# Patient Record
Sex: Male | Born: 1955
Health system: Southern US, Community
[De-identification: ages and names within clinical notes are randomized; demographics above are authoritative.]

## PROBLEM LIST (undated history)

## (undated) ENCOUNTER — Ambulatory Visit: Admission: EM | Payer: Self-pay | Source: Home / Self Care

## (undated) DIAGNOSIS — N183 Chronic kidney disease, stage 3 unspecified: Secondary | ICD-10-CM

## (undated) DIAGNOSIS — I5022 Chronic systolic (congestive) heart failure: Secondary | ICD-10-CM

## (undated) DIAGNOSIS — N529 Male erectile dysfunction, unspecified: Secondary | ICD-10-CM

## (undated) DIAGNOSIS — F419 Anxiety disorder, unspecified: Secondary | ICD-10-CM

## (undated) DIAGNOSIS — M109 Gout, unspecified: Secondary | ICD-10-CM

## (undated) DIAGNOSIS — D649 Anemia, unspecified: Secondary | ICD-10-CM

## (undated) DIAGNOSIS — J189 Pneumonia, unspecified organism: Secondary | ICD-10-CM

## (undated) DIAGNOSIS — G4733 Obstructive sleep apnea (adult) (pediatric): Secondary | ICD-10-CM

## (undated) DIAGNOSIS — E785 Hyperlipidemia, unspecified: Secondary | ICD-10-CM

## (undated) DIAGNOSIS — I10 Essential (primary) hypertension: Secondary | ICD-10-CM

## (undated) DIAGNOSIS — N2581 Secondary hyperparathyroidism of renal origin: Secondary | ICD-10-CM

## (undated) HISTORY — DX: Male erectile dysfunction, unspecified: N52.9

## (undated) HISTORY — DX: Chronic kidney disease, stage 3 unspecified: N18.30

## (undated) HISTORY — DX: Obstructive sleep apnea (adult) (pediatric): G47.33

## (undated) HISTORY — DX: Chronic kidney disease, stage 3 (moderate): N18.3

## (undated) HISTORY — DX: Hyperlipidemia, unspecified: E78.5

## (undated) HISTORY — PX: COLONOSCOPY: SHX5424

## (undated) HISTORY — DX: Secondary hyperparathyroidism of renal origin: N25.81

## (undated) HISTORY — DX: Gout, unspecified: M10.9

## (undated) HISTORY — DX: Anxiety disorder, unspecified: F41.9

## (undated) HISTORY — PX: VASECTOMY: SHX75

## (undated) HISTORY — DX: Essential (primary) hypertension: I10

## (undated) HISTORY — DX: Anemia, unspecified: D64.9

## (undated) HISTORY — DX: Chronic systolic (congestive) heart failure: I50.22

---

## 2002-02-16 ENCOUNTER — Emergency Department (HOSPITAL_COMMUNITY): Admission: EM | Admit: 2002-02-16 | Discharge: 2002-02-16 | Payer: Self-pay | Admitting: *Deleted

## 2005-05-02 ENCOUNTER — Encounter: Admission: RE | Admit: 2005-05-02 | Discharge: 2005-05-02 | Payer: Self-pay | Admitting: Internal Medicine

## 2008-11-12 ENCOUNTER — Encounter: Admission: RE | Admit: 2008-11-12 | Discharge: 2008-11-12 | Payer: Self-pay | Admitting: Internal Medicine

## 2008-12-25 DIAGNOSIS — I5022 Chronic systolic (congestive) heart failure: Secondary | ICD-10-CM | POA: Insufficient documentation

## 2008-12-26 ENCOUNTER — Ambulatory Visit: Payer: Self-pay | Admitting: Cardiovascular Disease

## 2008-12-26 ENCOUNTER — Inpatient Hospital Stay (HOSPITAL_COMMUNITY): Admission: EM | Admit: 2008-12-26 | Discharge: 2008-12-30 | Payer: Self-pay | Admitting: Emergency Medicine

## 2008-12-26 ENCOUNTER — Encounter: Payer: Self-pay | Admitting: Internal Medicine

## 2008-12-26 DIAGNOSIS — D649 Anemia, unspecified: Secondary | ICD-10-CM | POA: Insufficient documentation

## 2008-12-26 LAB — CONVERTED CEMR LAB
BUN: 25 mg/dL
Bilirubin Urine: NEGATIVE
CO2: 23 meq/L
Calcium: 8.8 mg/dL
Chloride: 107 meq/L
Cholesterol: 141 mg/dL
Creatinine, Ser: 2.24 mg/dL
Eosinophils Relative: 0.1 %
Glucose, Bld: 109 mg/dL
Glucose, Urine, Semiquant: NEGATIVE
HCT: 36.5 %
HDL: 32 mg/dL
Hemoglobin: 12.2 g/dL
Ketones, urine, test strip: NEGATIVE
LDL Cholesterol: 96 mg/dL
Lymphocytes, automated: 1.4 %
MCV: 85 fL
Magnesium: 2.1 mg/dL
Monocytes Relative: 0.3 %
Neutrophils Relative %: 4.4 %
Nitrite: NEGATIVE
Platelets: 202 10*3/uL
Potassium: 3.6 meq/L
Protein, U semiquant: 30
RBC: 4.29 M/uL
RDW: 13.4 %
Sodium: 137 meq/L
Specific Gravity, Urine: 1.015
TSH: 2.631 microintl units/mL
Triglyceride fasting, serum: 65 mg/dL
Urobilinogen, UA: 0.2
WBC: 6.2 10*3/uL
pH: 5

## 2008-12-27 ENCOUNTER — Encounter (INDEPENDENT_AMBULATORY_CARE_PROVIDER_SITE_OTHER): Payer: Self-pay | Admitting: Cardiovascular Disease

## 2008-12-28 ENCOUNTER — Encounter: Payer: Self-pay | Admitting: Internal Medicine

## 2008-12-28 LAB — CONVERTED CEMR LAB
BUN: 32 mg/dL
CO2: 26 meq/L
Chloride: 109 meq/L
Creatinine, Ser: 2.63 mg/dL
Glucose, Bld: 93 mg/dL
Potassium: 3.3 meq/L
Sodium: 141 meq/L

## 2008-12-29 ENCOUNTER — Encounter: Payer: Self-pay | Admitting: Internal Medicine

## 2008-12-29 LAB — CONVERTED CEMR LAB
Albumin: 3 g/dL
BUN: 32 mg/dL
CO2: 25 meq/L
Calcium: 8.8 mg/dL
Chloride: 107 meq/L
Creatinine, Ser: 2.38 mg/dL
Glucose, Bld: 91 mg/dL
Potassium: 3.7 meq/L
Sodium: 140 meq/L

## 2008-12-30 ENCOUNTER — Encounter: Payer: Self-pay | Admitting: Internal Medicine

## 2008-12-30 LAB — CONVERTED CEMR LAB
ALT: 26 units/L
AST: 24 units/L
Albumin: 3 g/dL
Alkaline Phosphatase: 54 units/L
BUN: 31 mg/dL
CO2: 27 meq/L
Calcium: 9 mg/dL
Chloride: 108 meq/L
Creatinine, Ser: 2.66 mg/dL
Glucose, Bld: 88 mg/dL
Potassium: 3.9 meq/L
Sodium: 142 meq/L
Total Bilirubin: 0.6 mg/dL
Total Protein: 5.7 g/dL

## 2009-01-24 ENCOUNTER — Encounter: Payer: Self-pay | Admitting: Internal Medicine

## 2009-01-24 DIAGNOSIS — I10 Essential (primary) hypertension: Secondary | ICD-10-CM | POA: Insufficient documentation

## 2009-01-25 ENCOUNTER — Ambulatory Visit: Payer: Self-pay | Admitting: Internal Medicine

## 2009-01-25 ENCOUNTER — Encounter (INDEPENDENT_AMBULATORY_CARE_PROVIDER_SITE_OTHER): Payer: Self-pay | Admitting: *Deleted

## 2009-01-25 ENCOUNTER — Telehealth: Payer: Self-pay | Admitting: Internal Medicine

## 2009-01-25 DIAGNOSIS — E785 Hyperlipidemia, unspecified: Secondary | ICD-10-CM | POA: Insufficient documentation

## 2009-01-25 DIAGNOSIS — N184 Chronic kidney disease, stage 4 (severe): Secondary | ICD-10-CM | POA: Insufficient documentation

## 2009-01-25 LAB — CONVERTED CEMR LAB
Albumin: 3.8 g/dL (ref 3.5–5.2)
BUN: 47 mg/dL — ABNORMAL HIGH (ref 6–23)
Basophils Absolute: 0 10*3/uL (ref 0.0–0.1)
Basophils Relative: 0.6 % (ref 0.0–3.0)
CO2: 28 meq/L (ref 19–32)
Calcium: 9.2 mg/dL (ref 8.4–10.5)
Chloride: 103 meq/L (ref 96–112)
Creatinine, Ser: 3.2 mg/dL — ABNORMAL HIGH (ref 0.4–1.5)
Eosinophils Absolute: 0.1 10*3/uL (ref 0.0–0.7)
Eosinophils Relative: 3.4 % (ref 0.0–5.0)
Glucose, Bld: 101 mg/dL — ABNORMAL HIGH (ref 70–99)
HCT: 35.7 % — ABNORMAL LOW (ref 39.0–52.0)
Hemoglobin: 12.5 g/dL — ABNORMAL LOW (ref 13.0–17.0)
Lymphocytes Relative: 32.8 % (ref 12.0–46.0)
Lymphs Abs: 1.2 10*3/uL (ref 0.7–4.0)
MCHC: 34.9 g/dL (ref 30.0–36.0)
MCV: 82.7 fL (ref 78.0–100.0)
Monocytes Absolute: 0.3 10*3/uL (ref 0.1–1.0)
Monocytes Relative: 7.3 % (ref 3.0–12.0)
Neutro Abs: 2.1 10*3/uL (ref 1.4–7.7)
Neutrophils Relative %: 55.9 % (ref 43.0–77.0)
Phosphorus: 5.1 mg/dL — ABNORMAL HIGH (ref 2.3–4.6)
Platelets: 148 10*3/uL — ABNORMAL LOW (ref 150.0–400.0)
Potassium: 4.3 meq/L (ref 3.5–5.1)
RBC: 4.32 M/uL (ref 4.22–5.81)
RDW: 12.3 % (ref 11.5–14.6)
Sodium: 139 meq/L (ref 135–145)
WBC: 3.7 10*3/uL — ABNORMAL LOW (ref 4.5–10.5)

## 2009-01-31 ENCOUNTER — Encounter: Payer: Self-pay | Admitting: Cardiovascular Disease

## 2009-02-09 ENCOUNTER — Emergency Department (HOSPITAL_COMMUNITY): Admission: EM | Admit: 2009-02-09 | Discharge: 2009-02-09 | Payer: Self-pay | Admitting: Emergency Medicine

## 2009-02-10 ENCOUNTER — Ambulatory Visit: Payer: Self-pay | Admitting: Internal Medicine

## 2009-02-10 ENCOUNTER — Observation Stay (HOSPITAL_COMMUNITY): Admission: EM | Admit: 2009-02-10 | Discharge: 2009-02-11 | Payer: Self-pay | Admitting: Emergency Medicine

## 2009-02-12 DIAGNOSIS — Z862 Personal history of diseases of the blood and blood-forming organs and certain disorders involving the immune mechanism: Secondary | ICD-10-CM | POA: Insufficient documentation

## 2009-02-12 DIAGNOSIS — Z8639 Personal history of other endocrine, nutritional and metabolic disease: Secondary | ICD-10-CM

## 2009-02-24 ENCOUNTER — Ambulatory Visit: Payer: Self-pay | Admitting: Cardiovascular Disease

## 2009-03-10 ENCOUNTER — Encounter: Payer: Self-pay | Admitting: Cardiovascular Disease

## 2009-03-17 ENCOUNTER — Encounter: Payer: Self-pay | Admitting: Cardiovascular Disease

## 2009-03-17 ENCOUNTER — Ambulatory Visit: Payer: Self-pay

## 2009-04-07 ENCOUNTER — Telehealth: Payer: Self-pay | Admitting: Cardiovascular Disease

## 2009-04-12 ENCOUNTER — Ambulatory Visit: Payer: Self-pay | Admitting: Cardiovascular Disease

## 2009-04-26 ENCOUNTER — Ambulatory Visit: Payer: Self-pay | Admitting: Internal Medicine

## 2009-04-26 DIAGNOSIS — N529 Male erectile dysfunction, unspecified: Secondary | ICD-10-CM | POA: Insufficient documentation

## 2009-04-28 ENCOUNTER — Encounter: Payer: Self-pay | Admitting: Internal Medicine

## 2009-05-24 ENCOUNTER — Telehealth: Payer: Self-pay | Admitting: Internal Medicine

## 2009-05-31 ENCOUNTER — Ambulatory Visit: Payer: Self-pay | Admitting: Cardiovascular Disease

## 2009-06-22 ENCOUNTER — Telehealth: Payer: Self-pay | Admitting: Internal Medicine

## 2009-07-26 ENCOUNTER — Ambulatory Visit: Payer: Self-pay | Admitting: Internal Medicine

## 2009-07-26 LAB — CONVERTED CEMR LAB
Albumin: 3.8 g/dL (ref 3.5–5.2)
BUN: 31 mg/dL — ABNORMAL HIGH (ref 6–23)
CO2: 30 meq/L (ref 19–32)
Calcium: 9 mg/dL (ref 8.4–10.5)
Chloride: 106 meq/L (ref 96–112)
Creatinine, Ser: 3 mg/dL — ABNORMAL HIGH (ref 0.4–1.5)
GFR calc non Af Amer: 28.42 mL/min (ref >60)
Glucose, Bld: 99 mg/dL (ref 70–99)
Phosphorus: 3.8 mg/dL (ref 2.3–4.6)
Potassium: 4.2 meq/L (ref 3.5–5.1)
Sodium: 141 meq/L (ref 135–145)

## 2009-09-13 ENCOUNTER — Encounter: Payer: Self-pay | Admitting: Internal Medicine

## 2009-09-20 ENCOUNTER — Ambulatory Visit: Payer: Self-pay | Admitting: Cardiovascular Disease

## 2009-09-22 ENCOUNTER — Encounter (INDEPENDENT_AMBULATORY_CARE_PROVIDER_SITE_OTHER): Payer: Self-pay | Admitting: *Deleted

## 2009-09-29 ENCOUNTER — Encounter (INDEPENDENT_AMBULATORY_CARE_PROVIDER_SITE_OTHER): Payer: Self-pay

## 2009-10-04 ENCOUNTER — Ambulatory Visit: Payer: Self-pay | Admitting: Gastroenterology

## 2009-10-12 ENCOUNTER — Ambulatory Visit: Payer: Self-pay | Admitting: Gastroenterology

## 2009-10-12 LAB — HM COLONOSCOPY: HM Colonoscopy: NORMAL

## 2009-10-27 ENCOUNTER — Ambulatory Visit: Payer: Self-pay | Admitting: Internal Medicine

## 2009-10-31 ENCOUNTER — Telehealth: Payer: Self-pay | Admitting: Internal Medicine

## 2009-11-30 ENCOUNTER — Telehealth: Payer: Self-pay | Admitting: Internal Medicine

## 2009-12-15 ENCOUNTER — Encounter: Payer: Self-pay | Admitting: Internal Medicine

## 2009-12-26 ENCOUNTER — Encounter: Payer: Self-pay | Admitting: Internal Medicine

## 2010-01-10 ENCOUNTER — Telehealth: Payer: Self-pay | Admitting: Internal Medicine

## 2010-01-31 ENCOUNTER — Ambulatory Visit: Payer: Self-pay | Admitting: Internal Medicine

## 2010-01-31 LAB — CONVERTED CEMR LAB
Cholesterol: 185 mg/dL (ref 0–200)
HDL: 32.1 mg/dL — ABNORMAL LOW (ref >39.00)
LDL Cholesterol: 122 mg/dL — ABNORMAL HIGH (ref 0–99)
Total CHOL/HDL Ratio: 6
Triglycerides: 155 mg/dL — ABNORMAL HIGH (ref 0.0–149.0)
VLDL: 31 mg/dL (ref 0.0–40.0)

## 2010-03-13 ENCOUNTER — Encounter: Payer: Self-pay | Admitting: Internal Medicine

## 2010-03-21 ENCOUNTER — Encounter: Payer: Self-pay | Admitting: Internal Medicine

## 2010-04-17 ENCOUNTER — Telehealth: Payer: Self-pay | Admitting: Internal Medicine

## 2010-05-02 ENCOUNTER — Ambulatory Visit: Payer: Self-pay | Admitting: Internal Medicine

## 2010-05-05 ENCOUNTER — Ambulatory Visit: Payer: Self-pay | Admitting: Cardiovascular Disease

## 2010-06-08 ENCOUNTER — Encounter: Payer: Self-pay | Admitting: Internal Medicine

## 2010-06-08 LAB — CONVERTED CEMR LAB
ALT: 20 units/L
AST: 28 units/L
Albumin: 4 g/dL
BUN: 24 mg/dL
Basophils Relative: 0 %
CO2: 26 meq/L
Calcium: 8.5 mg/dL
Chloride: 104 meq/L
Creatinine, Ser: 2.34 mg/dL
Eosinophils Relative: 2 %
Glucose, Bld: 101 mg/dL
HCT: 35.4 %
Hemoglobin: 12.1 g/dL
Lymphocytes, automated: 13 %
MCV: 85 fL
Monocytes Relative: 8 %
Neutrophils Relative %: 77 %
Platelets: 203 10*3/uL
Potassium: 4.3 meq/L
RBC: 4.18 M/uL
RDW: 12.9 %
Sodium: 138 meq/L
Total Bilirubin: 0.2 mg/dL
Total Protein: 6.4 g/dL
WBC: 9.1 10*3/uL

## 2010-06-23 ENCOUNTER — Encounter: Payer: Self-pay | Admitting: Internal Medicine

## 2010-07-16 LAB — CONVERTED CEMR LAB
ALT: 16 units/L
ALT: 20 units/L
AST: 25 units/L
AST: 36 units/L
Albumin: 4.1 g/dL
Albumin: 4.4 g/dL
Alkaline Phosphatase: 79 units/L
Alkaline Phosphatase: 80 units/L
BUN: 27 mg/dL
BUN: 33 mg/dL
CO2: 28 meq/L
CO2: 29 meq/L
Calcium: 9.1 mg/dL
Calcium: 9.3 mg/dL
Chloride: 100 meq/L
Chloride: 102 meq/L
Creatinine, Ser: 2.78 mg/dL
Creatinine, Ser: 2.92 mg/dL
Glucose, Bld: 93 mg/dL
Glucose, Bld: 95 mg/dL
HCT: 35.1 %
Hemoglobin: 12.1 g/dL
Platelets: 206 10*3/uL
Potassium: 4 meq/L
Potassium: 4.5 meq/L
RBC: 4.12 M/uL
Sodium: 138 meq/L
Sodium: 139 meq/L
Total Bilirubin: 0.3 mg/dL
Total Bilirubin: 0.3 mg/dL
Total Protein: 6.8 g/dL
Total Protein: 6.9 g/dL
WBC: 5.9 10*3/uL

## 2010-07-20 NOTE — Procedures (Signed)
Summary: Colonoscopy  Patient: [name] Note: All result statuses are Final unless otherwise noted.  Tests: (1) Colonoscopy (COL)   COL Colonoscopy           [facility] [employer].     [place], [place]  [address]           COLONOSCOPY PROCEDURE REPORT           PATIENT:  [name], [name]  MR#:  [id number]     BIRTHDATE:  1955-10-14, 37 yrs. old  GENDER:  male     ENDOSCOPIST:  Milus Banister, MD     REF. BY:  Gwendolyn Grant, M.D.     PROCEDURE DATE:  10/12/2009     PROCEDURE:  Diagnostic Colonoscopy     ASA CLASS:  Class III     INDICATIONS:  Routine Risk Screening     MEDICATIONS:   Fentanyl 50 mcg IV, Versed 4 mg IV           DESCRIPTION OF PROCEDURE:   After the risks benefits and     alternatives of the procedure were thoroughly explained, informed     consent was obtained.  Digital rectal exam was performed and     revealed no rectal masses.   The LB CF-[id number] endoscope     was introduced through the anus and advanced to the cecum, which     was identified by both the appendix and ileocecal valve, without     limitations.  The quality of the prep was good, using MoviPrep.     The instrument was then slowly withdrawn as the colon was fully     examined.     <<PROCEDUREIMAGES>>           FINDINGS:  Mild diverticulosis was found in the sigmoid to     descending colon segments (see image1).  This was otherwise a     normal examination of the colon (see image3, image4, and image5).     Retroflexed views in the rectum revealed no abnormalities.    The     scope was then withdrawn from the patient and the procedure     completed.           COMPLICATIONS:  None           ENDOSCOPIC IMPRESSION:     1) Mild diverticulosis in the sigmoid to descending colon     segments     2) Otherwise normal examination     3) No polyps or cancers           RECOMMENDATIONS:     1) Continue current colorectal screening recommendations for  "routine risk" patients with a repeat colonoscopy in 10 years.           REPEAT EXAM:  10 years           ______________________________     Milus Banister, MD           n.     eSIGNED:   Milus Banister at 10/12/2009 03:53 PM           [place], Venora Maples, [id number]  Note: An exclamation mark (!) indicates a result that was not dispersed into the flowsheet. Document Creation Date: 10/12/2009 3:54 PM _______________________________________________________________________  (1) Order result status: Final Collection or observation date-time: 10/12/2009 15:50 Requested date-time:  Receipt date-time:  Reported date-time:  Referring Physician:   Ordering Physician:  Owens Loffler [phone]) Specimen Source:  Source: Tawanna Cooler Order Number: [id number] Lab site:   Appended Document: Colonoscopy    Clinical Lists Changes  Observations: Added new observation of COLONNXTDUE: 09/2019 (10/12/2009 16:08)

## 2010-07-20 NOTE — Letter (Signed)
Summary: [facility] Instructions  [facility]  [place], [place] [address]   Phone: [phone]  Fax: [phone]       YVENS STOBER    [birth year]    MRN: [id number]        Procedure Day Sudie Grumbling:  [facility]  10/12/09     Arrival Time:  3:00PM     Procedure Time:  4:00PM     Location of Procedure:                    _ X_  [place] (4th Floor)                        [place]   Starting 5 days prior to your procedure 10/07/09 do not eat nuts, seeds, popcorn, corn, beans, peas,  salads, or any raw vegetables.  Do not take any fiber supplements (e.g. Metamucil, Citrucel, and Benefiber).  THE DAY BEFORE YOUR PROCEDURE         DATE: 10/11/09  DAY: TUESDAY  1.  Drink clear liquids the entire day-NO SOLID FOOD  2.  Do not drink anything colored red or purple.  Avoid juices with pulp.  No orange juice.  3.  Drink at least 64 oz. (8 glasses) of fluid/clear liquids during the day to prevent dehydration and help the prep work efficiently.  CLEAR LIQUIDS INCLUDE: Water Jello Ice Popsicles Tea (sugar ok, no milk/cream) Powdered fruit flavored drinks Coffee (sugar ok, no milk/cream) Gatorade Juice: apple, white grape, white cranberry  Lemonade Clear bullion, consomm, broth Carbonated beverages (any kind) Strained chicken noodle soup Hard Candy                             4.  In the morning, mix first dose of MoviPrep solution:    Empty 1 Pouch A and 1 Pouch B into the disposable container    Add lukewarm drinking water to the top line of the container. Mix to dissolve    Refrigerate (mixed solution should be used within 24 hrs)  5.  Begin drinking the prep at 5:00 p.m. The MoviPrep container is divided by 4 marks.   Every 15 minutes drink the solution down to the next mark (approximately 8 oz) until the full liter is complete.   6.  Follow completed prep with 16 oz of clear liquid of your choice  (Nothing red or purple).  Continue to drink clear liquids until bedtime.  7.  Before going to bed, mix second dose of MoviPrep solution:    Empty 1 Pouch A and 1 Pouch B into the disposable container    Add lukewarm drinking water to the top line of the container. Mix to dissolve    Refrigerate  THE DAY OF YOUR PROCEDURE      DATE: 10/12/09  DAY: WEDNESDAY  Beginning at 11:00AM (5 hours before procedure):         1. Every 15 minutes, drink the solution down to the next mark (approx 8 oz) until the full liter is complete.  2. Follow completed prep with 16 oz. of clear liquid of your choice.    3. You may drink clear liquids until 2:00PM (2 HOURS BEFORE PROCEDURE).   MEDICATION INSTRUCTIONS  Unless otherwise instructed, you should take regular prescription medications with a small sip of water   as early as possible the morning  of your procedure..  Additional medication instructions: Do not take furosemide am of procedure         OTHER INSTRUCTIONS  You will need a responsible adult at least 53 years of age to accompany you and drive you home.   This person must remain in the waiting room during your procedure.  Wear loose fitting clothing that is easily removed.  Leave jewelry and other valuables at home.  However, you may wish to bring a book to read or  an iPod/MP3 player to listen to music as you wait for your procedure to start.  Remove all body piercing jewelry and leave at home.  Total time from sign-in until discharge is approximately 2-3 hours.  You should go home directly after your procedure and rest.  You can resume normal activities the  day after your procedure.  The day of your procedure you should not:   Drive   Make legal decisions   Operate machinery   Drink alcohol   Return to work  You will receive specific instructions about eating, activities and medications before you leave.    The above instructions have been reviewed and explained  to me by   Cornelia Copa RN  October 04, 2009 9:46 AM     I fully understand and can verbalize these instructions _____________________________ Date _________

## 2010-07-20 NOTE — Letter (Signed)
Summary: Previsit letter  [facility]  [place], [place] [address]   Phone: [phone]  Fax: [phone]       09/22/2009 MRN: [id number]  [name], [place]  [address]  Dear [name],  Welcome to the Gastroenterology Division at [facility].    You are scheduled to see a nurse for your pre-procedure visit on 10-04-09 at 9:00a.m. on the 3rd floor at [facility], [place] [employer].  We ask that you try to arrive at our office 15 minutes prior to your appointment time to allow for check-in.  Your nurse visit will consist of discussing your medical and surgical history, your immediate family medical history, and your medications.    Please bring a complete list of all your medications or, if you prefer, bring the medication bottles and we will list them.  We will need to be aware of both prescribed and over the counter drugs.  We will need to know exact dosage information as well.  If you are on blood thinners (Coumadin, Plavix, Aggrenox, Ticlid, etc.) please call our office today/prior to your appointment, as we need to consult with your physician about holding your medication.   Please be prepared to read and sign documents such as consent forms, a financial agreement, and acknowledgement forms.  If necessary, and with your consent, a friend or relative is welcome to sit-in on the nurse visit with you.  Please bring your insurance card so that we may make a copy of it.  If your insurance requires a referral to see a specialist, please bring your referral form from your primary care physician.  No co-pay is required for this nurse visit.     If you cannot keep your appointment, please call [phone] to cancel or reschedule prior to your appointment date.  This allows [place] the opportunity to schedule an appointment for another patient in need of care.    Thank you for choosing [facility] for your medical needs.  We  appreciate the opportunity to care for you.  Please visit [place] at our website  to learn more about our practice.                     Sincerely.                                                                                                                   The Gastroenterology Division

## 2010-07-20 NOTE — Letter (Signed)
Summary: [facility]   Imported By: Bubba Hales 03/23/2010 09:40:32  _____________________________________________________________________  External Attachment:    Type:   Image     Comment:   External Document

## 2010-07-20 NOTE — Assessment & Plan Note (Signed)
Summary: f86m/jml   Primary Provider:  Rowe Clack MD   History of Present Illness: [name] is seen today for F/U of DCM, CRF and HTN. He saw Dr. Dedierding and had his lasix increased for LE edema.  His Cr is 2.7.  In the future he would be a candidate for kidney transplant.  He has been hospitalized multiiple times for CHF, EF 25% and hard to contorl BP.  On one occasion it was from  midrin and another salt indiscretion.  He is much more careful about his medication and diet;.  He denies PND, orthopnea, recurrent edem, or palpitaoitns. He is still working out and I encouraged him to do less heavy lifting and more aerobic activity.  He just returned from visiting his sister in [place].  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 Take 1 By Mouth Once Daily 2)  Clonidine Hcl 0.3 Mg Tabs (Clonidine Hcl) .... Take 1 By Mouth Three Times A Day or As Directed 3)  Furosemide 80 Mg Tabs (Furosemide) .... Take One Tab Two Times A Day 4)  Hydralazine Hcl 50 Mg Tabs (Hydralazine Hcl) .... One Tablet By Mouth Three Times Daily 5)  Labetalol Hcl 200 Mg Tabs (Labetalol Hcl) .... Take Two Tablets By Mouth Three Times A Day (Or As Directed).as Needed 6)  Micardis 80 Mg Tabs (Telmisartan) .... Take 1 By Mouth Once Daily 7)  Aspirin 81 Mg Tbec (Aspirin) .... Take 1 By Mouth Once Daily 8)  Imdur 30 Mg Xr24h-Tab (Isosorbide Mononitrate) .Marland Kitchen.. 1 Tab By Mouth Two Times A Day 9)  Lovaza 1 Gm Caps (Omega-3-Acid Ethyl Esters) .Marland Kitchen.. 1 By Mouth Two Times A Day 10)  Viagra 100 Mg Tabs (Sildenafil Citrate) ....  1/2-1 Tab 1 Hour Prior To Intercourse As Instructed 11)  Klor-Con M20 20 Meq Cr-Tabs (Potassium Chloride Crys Cr) .... Take 1 By Mouth Once Daily 12)  Calcium 500 Mg Tabs (Calcium) .Marland Kitchen.. 1 By Mouth Once Daily  Allergies (verified): No Known Drug Allergies  Past History:  Past Medical History: Last updated: [id number] CHRONIC SYSTOLIC HEART FAILURE (123456) DYSLIPIDEMIA HYPERTENSION (ICD-401.9), malignant CHRONIC KIDNEY DISEASE STAGE III (MODERATE)  baseline Cr 2.6-3.0 07/2009 HYPERPARATHYROIDISM, HX OF NICM - LVEF 30% - echo 7/10  MD roster:    renal - deterding cards - nishan  Past Surgical History: Last updated: 02/12/2009  Vasectomy.   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   Vital Signs:  Patient profile:   55 year old male Height:      73 inches Weight:      216 pounds BMI:     28.60 Pulse rate:   68 / minute Resp:  14 per minute BP sitting:   142 / 85  (right arm)  Vitals Entered By: Burnett Kanaris (May 05, 2010 2:48 PM)  Physical Exam  General:  Affect appropriate Healthy:  appears stated age 55: normal Neck supple with no adenopathy JVP normal no bruits no thyromegaly Lungs clear with no wheezing and good diaphragmatic motion Heart:  S1/S2 no murmur,rub, gallop or click PMI normal Abdomen: benighn, BS positve, no tenderness, no AAA no bruit.  No HSM or HJR Distal pulses intact with no bruits No edema Neuro non-focal Skin warm and dry    Impression  & Recommendations:  Problem # 1:  HYPERTENSION (ICD-401.9) Well controlled contineu low sodium diet, exercise and meds His updated medication list for this problem includes:    Tekturna 300 Mg Tabs (Aliskiren fumarate) .Marland Kitchen... Take 1 by mouth once daily    Clonidine Hcl 0.3 Mg Tabs (Clonidine hcl) .Marland Kitchen... Take 1 by mouth three times a day or as directed    Furosemide 80 Mg Tabs (Furosemide) .Marland Kitchen... Take one tab two times a day    Hydralazine Hcl 50 Mg Tabs (Hydralazine hcl) ..... One tablet by mouth three times daily    Labetalol Hcl 200 Mg Tabs (Labetalol hcl) .Marland Kitchen... Take two tablets by mouth three times a day (or as directed).as needed    Micardis 80 Mg Tabs (Telmisartan) .Marland Kitchen... Take 1 by mouth once daily    Aspirin 81 Mg Tbec (Aspirin) .Marland Kitchen... Take 1 by mouth once daily  Orders: EKG w/ Interpretation ([address])  Problem # 2:  CHRONIC KIDNEY DISEASE STAGE III (MODERATE) (ICD-585.3) CR stable at 2.7  F/U Dederding  Avoid NSAI's and nodium    Problem # 3:  CHRONIC SYSTOLIC HEART FAILURE (123456) Functional class one.  f/U echo in 6 months His updated medication list for this problem includes:    Furosemide 80 Mg Tabs (Furosemide) .Marland Kitchen... Take one tab two times a day    Labetalol Hcl 200 Mg Tabs (Labetalol hcl) .Marland Kitchen... Take two tablets by mouth three times a day (or as directed).as needed    Micardis 80 Mg Tabs (Telmisartan) .Marland Kitchen... Take 1 by mouth once daily    Aspirin 81 Mg Tbec (Aspirin) .Marland Kitchen... Take 1 by mouth once daily    Imdur 30 Mg Xr24h-tab (Isosorbide mononitrate) .Marland Kitchen... 1 tab by mouth two times a day  Orders: Echocardiogram (Echo)  Patient Instructions: 1)  Your physician recommends that you schedule a follow-up appointment in: [place] 2)  Your physician recommends that you continue on your current medications as directed. Please refer to the Current Medication list given to you today. 3)  Your physician has requested that you have an echocardiogram.  Echocardiography is a  painless test that uses sound waves to create images of your heart. It provides your doctor with information about the size and shape of your heart and how well your heart's chambers and valves are working.  This procedure takes approximately one hour. There are no restrictions for this procedure.6 MONTHS SEE DR Johnsie Cancel SAME DAY   EKG Report  Procedure date:  05/05/2010  Findings:      NSR 72 Inferiior T wave inversion No change from prev

## 2010-07-20 NOTE — Progress Notes (Signed)
Summary: Viagra  Phone Note Call from Patient   Caller: Patient Reason for Call: Refill Medication Summary of Call: Pt states he has lost rx for Viagra. Req md to call rx into pharm. Is this ok Initial call taken by: Tomma Lightning,  Oct 31, 2009 3:31 PM  Follow-up for Phone Call        ok -  Follow-up by: Rowe Clack MD,  Oct 31, 2009 3:47 PM  Additional Follow-up for Phone Call Additional follow up Details #1::        [facility]. notified pt rx sent electronically Additional Follow-up by: Tomma Lightning,  Oct 31, 2009 4:31 PM

## 2010-07-20 NOTE — Progress Notes (Signed)
Summary: Viagra  Phone Note Outgoing Call   Call placed by: Tomma Lightning,  November 30, 2009 10:21 AM Call placed to: pharmacy Summary of Call: Pt contacted [place] stating that pharmacy states not getting refill on his viagra. Rx was approve x's 1 on 11/19/09. Notified pharm spoke with Sara/ tech she states that they never recieved electronic rx back. That they was having problems with there electronic system. Giving verbal ok x'1 on his viagra. Will notify pt to let him know that we did call rx into pharm.  Initial call taken by: Tomma Lightning,  November 30, 2009 10:25 AM  Follow-up for Phone Call        Pt informed and is requesting an additional refill until OV in August at which time he will discuss with MD having a 3 mth Rx at a time. Follow-up by: Crissie Sickles, [place],  November 30, 2009 10:35 AM  Additional Follow-up for Phone Call Additional follow up Details #1::        ok for refill to get to aug ov Additional Follow-up by: Rowe Clack MD,  November 30, 2009 11:49 AM    Additional Follow-up for Phone Call Additional follow up Details #2::    Pharm left vm: Viagra interacts w/isosorbide. Is this still ok to fill? ................Marland KitchenCharlsie Quest, CMA  November 30, 2009 3:25 PM   yes - we have checked and approved this with cards previously Rowe Clack MD  November 30, 2009 4:59 PM   Additional Follow-up for Phone Call Additional follow up Details #3:: Details for Additional Follow-up Action Taken: Cristie Hem at [place] advised that interaction has been addressed and okay'd with pt's Cardiologist. I also authorized 1 additional refill on pt's Viagra. Additional Follow-up by: Crissie Sickles, [place],  December 01, 2009 9:17 AM

## 2010-07-20 NOTE — Assessment & Plan Note (Signed)
Summary: 3 MTH FU---STC   Vital Signs:  Patient profile:   55 year old male Height:      73 inches (185.42 cm) Weight:      213.0 pounds (96.82 kg) O2 Sat:      96 % on Room air Temp:     98.3 degrees F (36.83 degrees C) oral Pulse rate:   66 / minute BP sitting:   138 / 98  (left arm) Cuff size:   large  Vitals Entered By: Tomma Lightning RMA (May 02, 2010 2:35 PM)  O2 Flow:  Room air CC: 3 month follow-up Is Patient Diabetic? No Pain Assessment Patient in pain? no        Primary Care Provider:  Rowe Clack MD  CC:  3 month follow-up.  History of Present Illness: here for 3 mo followup  1) HTN - reports compliance with ongoing medical treatment and no changes in medication dose or frequency. denies adverse side effects related to current therapy. running high BP last few days but no CP, HA or SOB. follows with dr. Johnsie Cancel for same - now will see him every 6-12 mos.  2) dyslipidemia reports compliance with ongoing medical treatment and no changes in medication dose or frequency. denies adverse side effects related to current therapy.   3) CKD -renal insuff - follows with renal deterding for same - upcoming OV this week (rescheduled) - Cr stablizing 2.8 range - also now on calcium - no edema, no skin changes  4) ED - ok'd for viagra by cards - requesting higher dose as only gets 4 at time  Clinical Review Panels:  Lipid Management   Cholesterol:  185 (01/31/2010)   LDL (bad choesterol):  122 (01/31/2010)   HDL (good cholesterol):  32.10 (01/31/2010)   Triglycerides:  65 (12/26/2008)  CBC   WBC:  5.9 (12/15/2009)   RBC:  4.12 (12/15/2009)   Hgb:  12.1 (12/15/2009)   Hct:  35.1 (12/15/2009)   Platelets:  206 (12/15/2009)   MCV  82.7 (01/25/2009)   MCHC  34.9 (01/25/2009)   RDW  12.3 (01/25/2009)   PMN:  55.9 (01/25/2009)   Lymphs:  32.8 (01/25/2009)   Monos:  7.3 (01/25/2009)   Eosinophils:  3.4 (01/25/2009)   Basophil:  0.6  (01/25/2009)  Complete Metabolic Panel   Glucose:  95 (03/13/2010)   Sodium:  138 (03/13/2010)   Potassium:  4.5 (03/13/2010)   Chloride:  100 (03/13/2010)   CO2:  29 (03/13/2010)   BUN:  33 (03/13/2010)   Creatinine:  2.92 (03/13/2010)   Albumin:  4.4 (03/13/2010)   Total Protein:  6.9 (03/13/2010)   Calcium:  9.3 (03/13/2010)   Total Bili:  0.3 (03/13/2010)   Alk Phos:  79 (03/13/2010)   SGPT (ALT):  16 (03/13/2010)   SGOT (AST):  25 (03/13/2010)   Current Medications (verified): 1)  Tekturna 300 Mg Tabs (Aliskiren Fumarate) .... Take 1 By Mouth Qd 2)  Clonidine Hcl 0.3 Mg Tabs (Clonidine Hcl) .... Take 1 By Mouth Three Times A Day or As Directed 3)  Furosemide 80 Mg Tabs (Furosemide) .... Take One Tab Two Times A Day 4)  Hydralazine Hcl 50 Mg Tabs (Hydralazine Hcl) .... One Tablet By Mouth Three Times Daily 5)  Labetalol Hcl 200 Mg Tabs (Labetalol Hcl) .... Take Two Tablets Twice Daily 6)  Micardis 80 Mg Tabs (Telmisartan) .... Take 1 By Mouth Qd 7)  Aspirin 81 Mg Tbec (Aspirin) .... Take 1 By Mouth Qd 8)  Imdur 30 Mg Xr24h-Tab (Isosorbide Mononitrate) .... 1/2 Tab By Mouth Daily 9)  Lovaza 1 Gm Caps (Omega-3-Acid Ethyl Esters) .Marland Kitchen.. 1 By Mouth Two Times A Day 10)  Viagra 100 Mg Tabs (Sildenafil Citrate) .... 1/2-1 Tab 1 Hour Prior To Intercourse As Instructed 11)  Klor-Con M20 20 Meq Cr-Tabs (Potassium Chloride Crys Cr) .... Take 1 By Mouth Once Daily 12)  Calcium 500 Mg Tabs (Calcium) .Marland Kitchen.. 1 By Mouth Once Daily  Allergies (verified): No Known Drug Allergies  Past History:  Past Medical History: CHRONIC SYSTOLIC HEART FAILURE (123456) DYSLIPIDEMIA HYPERTENSION (ICD-401.9), malignant CHRONIC KIDNEY DISEASE STAGE III (MODERATE)  baseline Cr 2.6-3.0 07/2009 HYPERPARATHYROIDISM, HX OF NICM - LVEF 30% - echo 7/10  MD roster:    renal - deterding cards - nishan  Review of Systems  The patient denies anorexia, fever, weight gain, chest pain, headaches, and abdominal  pain.    Physical Exam  General:  fit appearing, approp stated age, alert, well-developed, well-nourished, and cooperative to examination.    Lungs:  normal respiratory effort, no intercostal retractions or use of accessory muscles; normal breath sounds bilaterally - no crackles and no wheezes.    Heart:  normal rate, regular rhythm, no murmur, and no rub. BLE without edema.    Impression & Recommendations:  Problem # 1:  HYPERTENSION (ICD-401.9)  His updated medication list for this problem includes:    Tekturna 300 Mg Tabs (Aliskiren fumarate) .Marland Kitchen... Take 1 by mouth once daily    Clonidine Hcl 0.3 Mg Tabs (Clonidine hcl) .Marland Kitchen... Take 1 by mouth three times a day or as directed    Furosemide 80 Mg Tabs (Furosemide) .Marland Kitchen... Take one tab two times a day    Hydralazine Hcl 50 Mg Tabs (Hydralazine hcl) ..... One tablet by mouth three times daily    Labetalol Hcl 200 Mg Tabs (Labetalol hcl) .Marland Kitchen... Take two tablets by mouth three times a day (or as directed)    Micardis 80 Mg Tabs (Telmisartan) .Marland Kitchen... Take 1 by mouth once daily  follows with renal and cards for same -  meds adjusted today to reflect Bbloc as needed dosing  BP today: 138/98 Prior BP: 140/88 (01/31/2010)  Labs Reviewed: K+: 4.5 (03/13/2010) Creat: : 2.92 (03/13/2010)   Chol: 185 (01/31/2010)   HDL: 32.10 (01/31/2010)   LDL: 122 (01/31/2010)   TG: 155.0 (01/31/2010)  Orders: Cardiology Referral (Cardiology) Prescription Created Electronically [phone])  Problem # 2:  CHRONIC KIDNEY DISEASE STAGE III (MODERATE) (ICD-585.3)  Baseline Cr around 2.9. continue to follow with renal  (Deterding)  Low protien + low sodium diet  Labs Reviewed: BUN: 31 (07/26/2009)   Cr: 3.0 (07/26/2009)    Hgb: 12.5 (01/25/2009)   Hct: 35.7 (01/25/2009)   Ca++: 9.0 (07/26/2009)   Phos: 3.8 (07/26/2009) TP: 5.7 (12/30/2008)   Alb: 3.8 (07/26/2009)  Labs Reviewed: BUN: 27 (12/15/2009)   Cr: 2.78 (12/15/2009)    Hgb: 12.1 (12/15/2009)   Hct: 35.1  (12/15/2009)   Ca++: 9.1 (12/15/2009)   Phos: 3.8 (07/26/2009) TP: 6.8 (12/15/2009)   Alb: 4.1 (12/15/2009)  Labs Reviewed: BUN: 33 (03/13/2010)   Cr: 2.92 (03/13/2010)    Hgb: 12.1 (12/15/2009)   Hct: 35.1 (12/15/2009)   Ca++: 9.3 (03/13/2010)   Phos: 3.8 (07/26/2009) TP: 6.9 (03/13/2010)   Alb: 4.4 (03/13/2010)  Problem # 3:  DYSLIPIDEMIA (ICD-272.4)  His updated medication list for this problem includes:    Lovaza 1 Gm Caps (Omega-3-acid ethyl esters) .Marland Kitchen... 1 by mouth two times a day  Labs Reviewed: SGOT: 25 (03/13/2010)   SGPT: 16 (03/13/2010)   HDL:32.10 (01/31/2010), 32 (12/26/2008)  LDL:122 (01/31/2010), 96 (12/26/2008)  Chol:185 (01/31/2010), 141 (12/26/2008)  Trig:155.0 (01/31/2010), 65 (12/26/2008)  Problem # 4:  CHRONIC SYSTOLIC HEART FAILURE (123456)  His updated medication list for this problem includes:    Furosemide 80 Mg Tabs (Furosemide) .Marland Kitchen... Take one tab two times a day    Labetalol Hcl 200 Mg Tabs (Labetalol hcl) .Marland Kitchen... Take two tablets by mouth three times a day (or as directed)    Micardis 80 Mg Tabs (Telmisartan) .Marland Kitchen... Take 1 by mouth once daily    Aspirin 81 Mg Tbec (Aspirin) .Marland Kitchen... Take 1 by mouth once daily per cards 09/2009: Stable functional class 2  f/U echo in 6 months (due 03/2010) - will help arrange cards f/u now Time spent with patient 25 minutes, more than 50% of this time was spent counseling patient on medications including as needed dose of clonidine and labetolol and need for cards f/u  Orders: Cardiology Referral (Cardiology)  Complete Medication List: 1)  Tekturna 300 Mg Tabs (Aliskiren fumarate) .... Take 1 by mouth once daily 2)  Clonidine Hcl 0.3 Mg Tabs (Clonidine hcl) .... Take 1 by mouth three times a day or as directed 3)  Furosemide 80 Mg Tabs (Furosemide) .... Take one tab two times a day 4)  Hydralazine Hcl 50 Mg Tabs (Hydralazine hcl) .... One tablet by mouth three times daily 5)  Labetalol Hcl 200 Mg Tabs (Labetalol hcl) ....  Take two tablets by mouth three times a day (or as directed) 6)  Micardis 80 Mg Tabs (Telmisartan) .... Take 1 by mouth once daily 7)  Aspirin 81 Mg Tbec (Aspirin) .... Take 1 by mouth once daily 8)  Imdur 30 Mg Xr24h-tab (Isosorbide mononitrate) .... 1/2 tab by mouth daily 9)  Lovaza 1 Gm Caps (Omega-3-acid ethyl esters) .Marland Kitchen.. 1 by mouth two times a day 10)  Viagra 100 Mg Tabs (Sildenafil citrate) .... 1/2-1 tab 1 hour prior to intercourse as instructed 11)  Klor-con M20 20 Meq Cr-tabs (Potassium chloride crys cr) .... Take 1 by mouth once daily 12)  Calcium 500 Mg Tabs (Calcium) .Marland Kitchen.. 1 by mouth once daily  Patient Instructions: 1)  it was good to see you today. 2)  reviewed medication today - adjustment of labetolol and continue all others as you are taking - your prescription has been electronically submitted to your pharmacy. Please take as directed. Contact our office if you believe you're having problems with the medication(s).  3)  Please schedule a follow-up appointment here in 3 months, sooner if problems.  4)  we'll make followup with cardiology for visit and echo (as instructed 09/2009 by Nishan) . Our office will contact you regarding this appointment once made.  Prescriptions: LABETALOL HCL 200 MG TABS (LABETALOL HCL) take two tablets by mouth three times a day (or as directed)  #180 x 3   Entered and Authorized by:   Rowe Clack MD   Signed by:   Rowe Clack MD on 05/02/2010   Method used:   Electronically to        [facility]. [id number]* (retail)       [address].       [place], [place]  [address]       Ph: [phone] or [phone]       Fax: [phone]   RxID:   [id number]  Orders Added: 1)  Est. Patient Level IV [id number] 2)  Cardiology Referral [Cardiology] 3)  Prescription Created Electronically [phone]   Immunization History:  Influenza Immunization History:    Influenza:  historical given @ dr. Jimmy Footman  (03/18/2010)   Immunization History:  Influenza Immunization History:    Influenza:  Historical given @ dr. Jimmy Footman (03/18/2010)

## 2010-07-20 NOTE — Letter (Signed)
Summary: [facility]   Imported By: Bubba Hales 12/28/2009 07:41:14  _____________________________________________________________________  External Attachment:    Type:   Image     Comment:   External Document

## 2010-07-20 NOTE — Miscellaneous (Signed)
Summary: screening colo refer  Clinical Lists Changes  Problems: Added new problem of SPECIAL SCREENING FOR MALIGNANT NEOPLASMS COLON (ICD-V76.51) Orders: Added new Referral order of Gastroenterology Referral (GI) - Signed

## 2010-07-20 NOTE — Progress Notes (Signed)
Summary: lovaza  Phone Note Refill Request Message from:  Fax from Pharmacy on June 22, 2009 1:35 PM  Refills Requested: Medication #1:  LOVAZA 1 GM CAPS 1 by mouth two times a day  Method Requested: Electronic Initial call taken by: Tomma Lightning,  June 22, 2009 1:35 PM    Prescriptions: LOVAZA 1 GM CAPS (OMEGA-3-ACID ETHYL ESTERS) 1 by mouth two times a day  #60 x 5   Entered by:   Tomma Lightning   Authorized by:   Rowe Clack MD   Signed by:   Tomma Lightning on 06/22/2009   Method used:   Electronically to        [facility]. [id number]* (retail)       [address].       [place], [place]  [address]       Ph: [phone] or [phone]       Fax: [phone]   RxID:   [id number]

## 2010-07-20 NOTE — Assessment & Plan Note (Signed)
Summary: rov/sl   Primary Provider:  Rowe Clack MD  CC:  no complaints.  History of Present Illness: [name] is seen today for F/U of DCM, CRF and HTN. He saw Dr. Dedierding and had his lasix increased for LE edema.  His Cr is 2.7.  In the future he would be a candidate for kidney transplant.  He has been hospitalized multiiple times for CHF, EF 25% and hard to contorl BP.  On one occasion it was from  midrin and another salt indiscretion.  He is much more careful about his medication and diet;.  He denies PND, orthopnea, recurrent edem, or palpitaoitns. He is still working out and I encouraged him to do less heavy lifting and more aerobic activity.  He just returned from visiting his sister in [place].  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 Take 1 By Mouth Qd 2)  Clonidine Hcl 0.3 Mg Tabs (Clonidine Hcl) .... Take 1 By Mouth Three Times A Day or As Directed 3)  Furosemide 80 Mg Tabs (Furosemide) .... Take One Tab Two Times A Day 4)  Hydralazine Hcl 50 Mg Tabs (Hydralazine Hcl) .... One Tablet By Mouth Three Times Daily 5)  Labetalol Hcl 200 Mg Tabs (Labetalol Hcl) .... Take Two Tablets Twice Daily 6)  Micardis 80 Mg Tabs (Telmisartan) .... Take 1 By Mouth Qd 7)  Aspirin 81 Mg Tbec (Aspirin) .... Take 1 By Mouth Qd 8)  Imdur 30 Mg Xr24h-Tab (Isosorbide Mononitrate) .... 1/2 Tab By Mouth Daily 9)  Lovaza 1 Gm Caps  (Omega-3-Acid Ethyl Esters) .Marland Kitchen.. 1 By Mouth Two Times A Day 10)  Viagra 100 Mg Tabs (Sildenafil Citrate) .... 1/2-1 Tab 1 Hour Prior To Intercourse As Instructed 11)  Klor-Con M20 20 Meq Cr-Tabs (Potassium Chloride Crys Cr) .... Take 1 By Mouth Once Daily 12)  Calcium .Marland Kitchen.. 1 Tab By Mouth Once Daily  Allergies (verified): No Known Drug Allergies  Past History:  Past Medical History: Last updated: [id number] CHRONIC SYSTOLIC HEART FAILURE (123456) DYSLIPIDEMIA (ICD-272.4) HYPERTENSION (ICD-401.9), malignant CHRONIC KIDNEY DISEASE STAGE III (MODERATE) (ICD-585.3) HEADACHE (ICD-784.0) ANEMIA (ICD-285.9) HYPERPARATHYROIDISM, HX OF (ICD-V12.2) Renal insufficiency cr 2.6 7/10  NICM - LVEF 30% - echo 7/10  MD rooster: renal - deterding cards - Chrisette Man  Past Surgical History: Last updated: 02/12/2009  Vasectomy.   Family History: Last updated: 01/25/2009 Family History Hypertension (Parents) dad - alive 33y - HTN, CAD, COPD mom - alive 31y - HTN  Social History: Last updated: 01/25/2009 Never Smoked no EtOH very active - exercises regularly [place] [employer], structural lives alone, divorced x 3  Review of Systems       Denies fever, malais, weight loss, blurry vision, decreased visual acuity, cough, sputum, SOB, hemoptysis, pleuritic pain, palpitaitons, heartburn, abdominal pain, melena, lower extremity edema, claudication, or rash.   Vital Signs:  Patient profile:   55 year old male Height:      73 inches Weight:  219 pounds BMI:     29.00 Pulse rate:   66 / minute Resp:     12 per minute BP sitting:   140 / 90  (left arm)  Vitals Entered By: Burnett Kanaris (September 20, 2009 2:00 PM)  Physical Exam  General:  Affect appropriate Healthy:  appears stated age 85: normal Neck supple with no adenopathy JVP normal no bruits no thyromegaly Lungs clear with no wheezing and good diaphragmatic motion Heart:  S1/S2 no murmur,rub, gallop or click PMI  enlarged Abdomen: benighn, BS positve, no tenderness, no AAA no bruit.  No HSM or HJR Distal pulses intact with no bruits No edema Neuro non-focal Skin warm and dry    Impression & Recommendations:  Problem # 1:  HYPERTENSION (ICD-401.9) Considering how high he was this is good conrtol for him.  Would not aim lower given CRF. His updated medication list for this problem includes:    Tekturna 300 Mg Tabs (Aliskiren fumarate) .Marland Kitchen... Take 1 by mouth qd    Clonidine Hcl 0.3 Mg Tabs (Clonidine hcl) .Marland Kitchen... Take 1 by mouth three times a day or as directed    Furosemide 80 Mg Tabs (Furosemide) .Marland Kitchen... Take one tab two times a day    Hydralazine Hcl 50 Mg Tabs (Hydralazine hcl) ..... One tablet by mouth three times daily    Labetalol Hcl 200 Mg Tabs (Labetalol hcl) .Marland Kitchen... Take two tablets twice daily    Micardis 80 Mg Tabs (Telmisartan) .Marland Kitchen... Take 1 by mouth qd    Aspirin 81 Mg Tbec (Aspirin) .Marland Kitchen... Take 1 by mouth qd  Problem # 2:  CHRONIC KIDNEY DISEASE STAGE III (MODERATE) (ICD-585.3) Baseline Cr around 2.7.  F/U Dr Jimmy Footman.  Low protien low sodium diet  Problem # 3:  CHRONIC SYSTOLIC HEART FAILURE (123456) Stable functional class 2  f/U echo in 6 months His updated medication list for this problem includes:    Furosemide 80 Mg Tabs (Furosemide) .Marland Kitchen... Take one tab two times a day    Labetalol Hcl 200 Mg Tabs (Labetalol hcl) .Marland Kitchen... Take two tablets twice daily    Micardis 80 Mg Tabs (Telmisartan) .Marland Kitchen... Take 1 by mouth qd    Aspirin 81 Mg Tbec (Aspirin) .Marland Kitchen... Take 1 by mouth qd    Imdur 30 Mg Xr24h-tab (Isosorbide mononitrate) .Marland Kitchen... 1/2 tab by mouth daily  Patient Instructions: 1)  Your physician recommends that you schedule a follow-up appointment in: 6 MONTHS 2)  Your physician has requested that you have an echocardiogram.  Echocardiography is a painless test that uses sound waves to create images of your heart. It provides your doctor with information about the size and shape of your  heart and how well your heart's chambers and valves are working.  This procedure takes approximately one hour. There are no restrictions for this procedure.SCHEDULE IN 6 MONTHS

## 2010-07-20 NOTE — Progress Notes (Signed)
Summary: Rx refill req  Phone Note Refill Request Message from:  Fax from Pharmacy on April 17, 2010 11:56 AM  Refills Requested: Medication #1:  KLOR-CON M20 20 MEQ CR-TABS take 1 by mouth once daily   Dosage confirmed as above?Dosage Confirmed   Last Refilled: 03/14/2010  Method Requested: Electronic Next Appointment Scheduled: 05/02/2010 Initial call taken by: Rebeca Alert CMA ([place]),  April 17, 2010 11:56 AM    Prescriptions: KLOR-CON M20 20 MEQ CR-TABS (POTASSIUM CHLORIDE CRYS CR) take 1 by mouth once daily  #30 x 2   Entered by:   Rebeca Alert CMA ([place])   Authorized by:   Rowe Clack MD   Signed by:   Rebeca Alert CMA ([place]) on 04/17/2010   Method used:   Electronically to        [facility]. [id number]* (retail)       [address].       [place], [place]  [address]       Ph: [phone] or [phone]       Fax: [phone]   RxID:   [id number]

## 2010-07-20 NOTE — Progress Notes (Signed)
Summary: Rx refill req  Phone Note Refill Request Message from:  Fax from Pharmacy on January 10, 2010 10:14 AM  Refills Requested: Medication #1:  KLOR-CON M20 20 MEQ CR-TABS take 1 by mouth once daily   Dosage confirmed as above?Dosage Confirmed   Supply Requested: 3 months   Last Refilled: 12/12/2009  Method Requested: Electronic Initial call taken by: Crissie Sickles, CMA,  January 10, 2010 10:14 AM    Prescriptions: KLOR-CON M20 20 MEQ CR-TABS (POTASSIUM CHLORIDE CRYS CR) take 1 by mouth once daily  #30 x 2   Entered by:   Crissie Sickles, CMA   Authorized by:   Rowe Clack MD   Signed by:   Crissie Sickles, CMA on 01/10/2010   Method used:   Electronically to        [facility]. [id number]* (retail)       [address].       [place], [place]  [address]       Ph: [phone] or [phone]       Fax: [phone]   RxID:   [id number]

## 2010-07-20 NOTE — Miscellaneous (Signed)
Summary: Lec previsit  Clinical Lists Changes  Medications: Added new medication of MOVIPREP 100 GM  SOLR (PEG-KCL-NACL-NASULF-NA ASC-C) As per prep instructions. - Signed Rx of MOVIPREP 100 GM  SOLR (PEG-KCL-NACL-NASULF-NA ASC-C) As per prep instructions.;  #1 x 0;  Signed;  Entered by: Cornelia Copa RN;  Authorized by: Milus Banister MD;  Method used: Electronically to [facility]. [id number]*, [address] [place], [place]  [address], Ph: [phone] or [phone], Fax: [phone] Observations: Added new observation of NKA: T (10/04/2009 8:50)    Prescriptions: MOVIPREP 100 GM  SOLR (PEG-KCL-NACL-NASULF-NA ASC-C) As per prep instructions.  #1 x 0   Entered by:   Cornelia Copa RN   Authorized by:   Milus Banister MD   Signed by:   Cornelia Copa RN on 10/04/2009   Method used:   Electronically to        [facility]. [id number]* (retail)       [address].       [place], [place]  [address]       Ph: [phone] or [phone]       Fax: [phone]   RxID:   [id number]

## 2010-07-20 NOTE — Letter (Signed)
Summary: [facility]   Imported By: Phillis Knack 06/29/2010 08:45:17  _____________________________________________________________________  External Attachment:    Type:   Image     Comment:   External Document

## 2010-07-20 NOTE — Assessment & Plan Note (Signed)
Summary: 3-4 MTH FU---D/T--STC   Vital Signs:  Patient profile:   55 year old male Height:      73 inches (185.42 cm) Weight:      224.0 pounds (101.82 kg) O2 Sat:      98 % on Room air Temp:     98.2 degrees F (36.78 degrees C) oral Pulse rate:   67 / minute BP sitting:   162 / 110  (left arm) Cuff size:   large  Vitals Entered By: Tomma Lightning (July 26, 2009 1:13 PM)  O2 Flow:  Room air CC: 3 month follow-up. also pt is req to increase potassium to twice a day. Having leg cramps. also want to know if he can get 100mg  on his viagra Is Patient Diabetic? No Pain Assessment Patient in pain? no        Primary Care Provider:  Rowe Clack MD  CC:  3 month follow-up. also pt is req to increase potassium to twice a day. Having leg cramps. also want to know if he can get 100mg  on his viagra.  History of Present Illness: here for 3 mo followup  1) HTN - reports compliance with ongoing medical treatment and no changes in medication dose or frequency. denies adverse side effects related to current therapy. running high BP last few days but no CP or SOB follows with dr. Johnsie Cancel for same - attributes elevation to stress with father's illness in [place]  2) dyslipidemia reports compliance with ongoing medical treatment and no changes in medication dose or frequency. denies adverse side effects related to current therapy.   3) renal insuff - follows with renal deterding for same - upcoming OV this week? (rescheduled) - Cr stablizing 2.8 range  4) ED - ok'd for viagra by cards - requesting higher dose as only gets 4 at time  Clinical Review Panels:  Immunizations   Last Tetanus Booster:  Tdap (01/25/2009)   Last Flu Vaccine:  Fluvax 3+ (04/26/2009)  Lipid Management   Cholesterol:  141 (12/26/2008)   LDL (bad choesterol):  96 (12/26/2008)   HDL (good cholesterol):  32 (12/26/2008)   Triglycerides:  65 (12/26/2008)  CBC   WBC:  3.7 (01/25/2009)   RBC:  4.32  (01/25/2009)   Hgb:  12.5 (01/25/2009)   Hct:  35.7 (01/25/2009)   Platelets:  148.0 (01/25/2009)   MCV  82.7 (01/25/2009)   MCHC  34.9 (01/25/2009)   RDW  12.3 (01/25/2009)   PMN:  55.9 (01/25/2009)   Lymphs:  32.8 (01/25/2009)   Monos:  7.3 (01/25/2009)   Eosinophils:  3.4 (01/25/2009)   Basophil:  0.6 (01/25/2009)  Complete Metabolic Panel   Glucose:  101 (01/25/2009)   Sodium:  139 (01/25/2009)   Potassium:  4.3 (01/25/2009)   Chloride:  103 (01/25/2009)   CO2:  28 (01/25/2009)   BUN:  47 (01/25/2009)   Creatinine:  3.2 (01/25/2009)   Albumin:  3.8 (01/25/2009)   Total Protein:  5.7 (12/30/2008)   Calcium:  9.2 (01/25/2009)   Total Bili:  0.6 (12/30/2008)   Alk Phos:  54 (12/30/2008)   SGPT (ALT):  26 (12/30/2008)   SGOT (AST):  24 (12/30/2008)   Current Medications (verified): 1)  Tekturna 300 Mg Tabs (Aliskiren Fumarate) .... Take 1 By Mouth Qd 2)  Clonidine Hcl 0.3 Mg Tabs (Clonidine Hcl) .... Take 1 By Mouth Three Times A Day or As Directed 3)  Furosemide 80 Mg Tabs (Furosemide) .... Take One Tab Two Times A  Day 4)  Hydralazine Hcl 50 Mg Tabs (Hydralazine Hcl) .... One Tablet By Mouth Three Times Daily 5)  Labetalol Hcl 200 Mg Tabs (Labetalol Hcl) .... Take Two Tablets Twice Daily 6)  Micardis 80 Mg Tabs (Telmisartan) .... Take 1 By Mouth Qd 7)  Aspirin 81 Mg Tbec (Aspirin) .... Take 1 By Mouth Qd 8)  Imdur 30 Mg Xr24h-Tab (Isosorbide Mononitrate) .Marland Kitchen.. 1 Tab By Mouth Once Daily 9)  Lovaza 1 Gm Caps (Omega-3-Acid Ethyl Esters) .Marland Kitchen.. 1 By Mouth Two Times A Day 10)  Viagra 50 Mg Tabs (Sildenafil Citrate) .... 1/2 To 1 Tab By Mouth 1 Hour Prior To Intercourse 11)  Klor-Con M20 20 Meq Cr-Tabs (Potassium Chloride Crys Cr) .... Take 1 By Mouth Once Daily  Allergies (verified): No Known Drug Allergies  Past History:  Past Medical History: CHRONIC SYSTOLIC HEART FAILURE (123456) DYSLIPIDEMIA (ICD-272.4) HYPERTENSION (ICD-401.9), malignant CHRONIC KIDNEY DISEASE  STAGE III (MODERATE) (ICD-585.3) HEADACHE (ICD-784.0) ANEMIA (ICD-285.9) HYPERPARATHYROIDISM, HX OF (ICD-V12.2) Renal insufficiency cr 2.6 7/10  NICM - LVEF 30% - echo 7/10  MD rooster: renal - deterding cards - nishan  Review of Systems  The patient denies weight loss, chest pain, syncope, and peripheral edema.    Physical Exam  General:  fit appearing, approp stated age, alert, well-developed, well-nourished, and cooperative to examination.    Lungs:  normal respiratory effort, no intercostal retractions or use of accessory muscles; normal breath sounds bilaterally - no crackles and no wheezes.    Heart:  normal rate, regular rhythm, no murmur, and no rub. BLE without edema.  Psych:  Oriented X3, memory intact for recent and remote, normally interactive, good eye contact, not anxious appearing, not depressed appearing, and not agitated.      Impression & Recommendations:  Problem # 1:  HYPERTENSION (ICD-401.9) recheck labs now pt to continue aggressive med dosing + as needed clonidine/hydralazine if elev BP at home cont also to follow with renal and cards as scheduled His updated medication list for this problem includes:    Tekturna 300 Mg Tabs (Aliskiren fumarate) .Marland Kitchen... Take 1 by mouth qd    Clonidine Hcl 0.3 Mg Tabs (Clonidine hcl) .Marland Kitchen... Take 1 by mouth three times a day or as directed    Furosemide 80 Mg Tabs (Furosemide) .Marland Kitchen... Take one tab two times a day    Hydralazine Hcl 50 Mg Tabs (Hydralazine hcl) ..... One tablet by mouth three times daily    Labetalol Hcl 200 Mg Tabs (Labetalol hcl) .Marland Kitchen... Take two tablets twice daily    Micardis 80 Mg Tabs (Telmisartan) .Marland Kitchen... Take 1 by mouth qd  Orders: TLB-Renal Function Panel (80069-RENAL)  BP today: 162/110 Prior BP: 112/72 (05/31/2009)  Labs Reviewed: K+: 4.3 (01/25/2009) Creat: : 3.2 (01/25/2009)   Chol: 141 (12/26/2008)   HDL: 32 (12/26/2008)   LDL: 96 (12/26/2008)   TG: 65 (12/26/2008)  Problem # 2:  CHRONIC KIDNEY  DISEASE STAGE III (MODERATE) (ICD-585.3)  Orders: TLB-Renal Function Panel (80069-RENAL)  continue treatment with dr. Jimmy Footman (copy of labs to be sent to renal office once done)  Labs Reviewed: BUN: 47 (01/25/2009)   Cr: 3.2 (01/25/2009)    Hgb: 12.5 (01/25/2009)   Hct: 35.7 (01/25/2009)   Ca++: 9.2 (01/25/2009)   Phos: 5.1 (01/25/2009) TP: 5.7 (12/30/2008)   Alb: 3.8 (01/25/2009)  Problem # 3:  ERECTILE DYSFUNCTION, ORGANIC (ICD-607.84)  His updated medication list for this problem includes:    Viagra 100 Mg Tabs (Sildenafil citrate) .Marland Kitchen... 1/2-1 tab 1 hour prior  to intercourse as instructed  exac by bBloc use for HTN - despite med tx for HTN and CKD, has been cleared by cards (dr. Johnsie Cancel) and renal (deterding) for med use - advised to continue to review periodically and to seek med attention if any problems occur with its use  Discussed proper use of medications, as well as side effects.   Problem # 4:  DYSLIPIDEMIA (ICD-272.4)  His updated medication list for this problem includes:    Lovaza 1 Gm Caps (Omega-3-acid ethyl esters) .Marland Kitchen... 1 by mouth two times a day  Labs Reviewed: SGOT: 24 (12/30/2008)   SGPT: 26 (12/30/2008)   HDL:32 (12/26/2008)  LDL:96 (12/26/2008)  Chol:141 (12/26/2008)  Trig:65 (12/26/2008)  Problem # 5:  CHRONIC SYSTOLIC HEART FAILURE (123456) compensated - no change in med mgmt His updated medication list for this problem includes:    Furosemide 80 Mg Tabs (Furosemide) .Marland Kitchen... Take one tab two times a day    Labetalol Hcl 200 Mg Tabs (Labetalol hcl) .Marland Kitchen... Take two tablets twice daily    Micardis 80 Mg Tabs (Telmisartan) .Marland Kitchen... Take 1 by mouth qd    Aspirin 81 Mg Tbec (Aspirin) .Marland Kitchen... Take 1 by mouth qd  Complete Medication List: 1)  Tekturna 300 Mg Tabs (Aliskiren fumarate) .... Take 1 by mouth qd 2)  Clonidine Hcl 0.3 Mg Tabs (Clonidine hcl) .... Take 1 by mouth three times a day or as directed 3)  Furosemide 80 Mg Tabs (Furosemide) .... Take one  tab two times a day 4)  Hydralazine Hcl 50 Mg Tabs (Hydralazine hcl) .... One tablet by mouth three times daily 5)  Labetalol Hcl 200 Mg Tabs (Labetalol hcl) .... Take two tablets twice daily 6)  Micardis 80 Mg Tabs (Telmisartan) .... Take 1 by mouth qd 7)  Aspirin 81 Mg Tbec (Aspirin) .... Take 1 by mouth qd 8)  Imdur 30 Mg Xr24h-tab (Isosorbide mononitrate) .Marland Kitchen.. 1 tab by mouth once daily 9)  Lovaza 1 Gm Caps (Omega-3-acid ethyl esters) .Marland Kitchen.. 1 by mouth two times a day 10)  Viagra 100 Mg Tabs (Sildenafil citrate) .... 1/2-1 tab 1 hour prior to intercourse as instructed 11)  Klor-con M20 20 Meq Cr-tabs (Potassium chloride crys cr) .... Take 1 by mouth once daily  Patient Instructions: 1)  it was good to see you today.  2)  test(s) ordered today - your results will be posted on the phone tree for review in 48-72 hours from the time of test completion; call [phone] and enter your 9 digit MRN (listed above on this page, just below your name); if any changes need to be made or there are abnormal results, you will be contacted directly.  3)  keep followup as scheduled with drs. nishan and deterding 4)  no medication changes today - keep doing what you are doing 5)  refills as discussed 6)  Please schedule a follow-up appointment in 3 months, sooner if problems.  Prescriptions: MICARDIS 80 MG TABS (TELMISARTAN) take 1 by mouth qd  #30 x 11   Entered and Authorized by:   Rowe Clack MD   Signed by:   Rowe Clack MD on 07/26/2009   Method used:   Print then Give to Patient   RxID:   [id number] HYDRALAZINE HCL 50 MG TABS (HYDRALAZINE HCL) ONE TABLET BY MOUTH THREE TIMES DAILY  #90 x 11   Entered and Authorized by:   Rowe Clack MD   Signed by:   Rowe Clack MD  on 07/26/2009   Method used:   Print then Give to Patient   RxID:   [id number] CLONIDINE HCL 0.3 MG TABS (CLONIDINE HCL) take 1 by mouth three times a day or as directed  #90 x 11   Entered and  Authorized by:   Rowe Clack MD   Signed by:   Rowe Clack MD on 07/26/2009   Method used:   Print then Give to Patient   RxID:   [id number] TEKTURNA 300 MG TABS (ALISKIREN FUMARATE) take 1 by mouth qd  #30 x 11   Entered and Authorized by:   Rowe Clack MD   Signed by:   Rowe Clack MD on 07/26/2009   Method used:   Print then Give to Patient   RxID:   [id number] VIAGRA 100 MG TABS (SILDENAFIL CITRATE) 1/2-1 tab 1 hour prior to intercourse as instructed  #6 x 0   Entered and Authorized by:   Rowe Clack MD   Signed by:   Rowe Clack MD on 07/26/2009   Method used:   Print then Give to Patient   RxID:   [id number]

## 2010-07-20 NOTE — Assessment & Plan Note (Signed)
Summary: per pt 3 mth fu from 2/8--stc   Vital Signs:  Patient profile:   55 year old male Height:      73 inches (185.42 cm) Weight:      219.8 pounds (99.91 kg) O2 Sat:      97 % on Room air Temp:     98.4 degrees F (36.89 degrees C) oral Pulse rate:   68 / minute BP sitting:   132 / 90  (left arm) Cuff size:   large  Vitals Entered By: Tomma Lightning (Oct 27, 2009 9:12 AM)  O2 Flow:  Room air CC: 3 month follow-up/ Req refill on viagra Is Patient Diabetic? No Pain Assessment Patient in pain? no        Primary Care Provider:  Rowe Clack MD  CC:  3 month follow-up/ Req refill on viagra.  History of Present Illness: here for 3 mo followup  1) HTN - reports compliance with ongoing medical treatment and no changes in medication dose or frequency. denies adverse side effects related to current therapy. running high BP last few days but no CP, HA or SOB. follows with dr. Johnsie Cancel for same - now will see him every 6-12 mos.  2) dyslipidemia reports compliance with ongoing medical treatment and no changes in medication dose or frequency. denies adverse side effects related to current therapy.   3) CKD -renal insuff - follows with renal deterding for same - upcoming OV this week (rescheduled) - Cr stablizing 2.8 range - also now on calcium - no edema, no skin changes  4) ED - ok'd for viagra by cards - requesting higher dose as only gets 4 at time  Clinical Review Panels:  Prevention   Last Colonoscopy:  DONE (10/12/2009)  Immunizations   Last Tetanus Booster:  Tdap (01/25/2009)   Last Flu Vaccine:  Fluvax 3+ (04/26/2009)  Lipid Management   Cholesterol:  141 (12/26/2008)   LDL (bad choesterol):  96 (12/26/2008)   HDL (good cholesterol):  32 (12/26/2008)   Triglycerides:  65 (12/26/2008)  Complete Metabolic Panel   Glucose:  99 (07/26/2009)   Sodium:  141 (07/26/2009)   Potassium:  4.2 (07/26/2009)   Chloride:  106 (07/26/2009)   CO2:  30 (07/26/2009)  BUN:  31 (07/26/2009)   Creatinine:  3.0 (07/26/2009)   Albumin:  3.8 (07/26/2009)   Total Protein:  5.7 (12/30/2008)   Calcium:  9.0 (07/26/2009)   Total Bili:  0.6 (12/30/2008)   Alk Phos:  54 (12/30/2008)   SGPT (ALT):  26 (12/30/2008)   SGOT (AST):  24 (12/30/2008)   Current Medications (verified): 1)  Tekturna 300 Mg Tabs (Aliskiren Fumarate) .... Take 1 By Mouth Qd 2)  Clonidine Hcl 0.3 Mg Tabs (Clonidine Hcl) .... Take 1 By Mouth Three Times A Day or As Directed 3)  Furosemide 80 Mg Tabs (Furosemide) .... Take One Tab Two Times A Day 4)  Hydralazine Hcl 50 Mg Tabs (Hydralazine Hcl) .... One Tablet By Mouth Three Times Daily 5)  Labetalol Hcl 200 Mg Tabs (Labetalol Hcl) .... Take Two Tablets Twice Daily 6)  Micardis 80 Mg Tabs (Telmisartan) .... Take 1 By Mouth Qd 7)  Aspirin 81 Mg Tbec (Aspirin) .... Take 1 By Mouth Qd 8)  Imdur 30 Mg Xr24h-Tab (Isosorbide Mononitrate) .... 1/2 Tab By Mouth Daily 9)  Lovaza 1 Gm Caps (Omega-3-Acid Ethyl Esters) .Marland Kitchen.. 1 By Mouth Two Times A Day 10)  Viagra 100 Mg Tabs (Sildenafil Citrate) .... 1/2-1 Tab 1 Hour  Prior To Intercourse As Instructed 11)  Klor-Con M20 20 Meq Cr-Tabs (Potassium Chloride Crys Cr) .... Take 1 By Mouth Once Daily 12)  Calcium .Marland Kitchen.. 1 Tab By Mouth Once Daily  Allergies (verified): No Known Drug Allergies  Past History:  Past Medical History: CHRONIC SYSTOLIC HEART FAILURE (123456) DYSLIPIDEMIA HYPERTENSION (ICD-401.9), malignant CHRONIC KIDNEY DISEASE STAGE III (MODERATE)  baseline Cr 2.6-3.0 07/2009 HYPERPARATHYROIDISM, HX OF NICM - LVEF 30% - echo 7/10  MD rooster:   renal - deterding cards - nishan  Review of Systems  The patient denies fever, chest pain, peripheral edema, and headaches.    Physical Exam  General:  fit appearing, approp stated age, alert, well-developed, well-nourished, and cooperative to examination.    Lungs:  normal respiratory effort, no intercostal retractions or use of accessory  muscles; normal breath sounds bilaterally - no crackles and no wheezes.    Heart:  normal rate, regular rhythm, no murmur, and no rub. BLE without edema.  Neurologic:  alert & oriented X3 and cranial nerves II-XII symetrically intact.  strength normal in all extremities, sensation intact to light touch, and gait normal. speech fluent without dysarthria or aphasia; follows commands with good comprehension.    Impression & Recommendations:  Problem # 1:  HYPERTENSION (ICD-401.9) Assessment Improved  His updated medication list for this problem includes:    Tekturna 300 Mg Tabs (Aliskiren fumarate) .Marland Kitchen... Take 1 by mouth qd    Clonidine Hcl 0.3 Mg Tabs (Clonidine hcl) .Marland Kitchen... Take 1 by mouth three times a day or as directed    Furosemide 80 Mg Tabs (Furosemide) .Marland Kitchen... Take one tab two times a day    Hydralazine Hcl 50 Mg Tabs (Hydralazine hcl) ..... One tablet by mouth three times daily    Labetalol Hcl 200 Mg Tabs (Labetalol hcl) .Marland Kitchen... Take two tablets twice daily    Micardis 80 Mg Tabs (Telmisartan) .Marland Kitchen... Take 1 by mouth qd  BP today: 132/90 Prior BP: 140/90 (09/20/2009)  Labs Reviewed: K+: 4.2 (07/26/2009) Creat: : 3.0 (07/26/2009)   Chol: 141 (12/26/2008)   HDL: 32 (12/26/2008)   LDL: 96 (12/26/2008)   TG: 65 (12/26/2008)  Problem # 2:  CHRONIC KIDNEY DISEASE STAGE III (MODERATE) (ICD-585.3)  Baseline Cr around 2.7.  F/U Dr Jimmy Footman.  Low protien low sodium diet  Labs Reviewed: BUN: 31 (07/26/2009)   Cr: 3.0 (07/26/2009)    Hgb: 12.5 (01/25/2009)   Hct: 35.7 (01/25/2009)   Ca++: 9.0 (07/26/2009)   Phos: 3.8 (07/26/2009) TP: 5.7 (12/30/2008)   Alb: 3.8 (07/26/2009)  Problem # 3:  DYSLIPIDEMIA (ICD-272.4)  check flp next OV His updated medication list for this problem includes:    Lovaza 1 Gm Caps (Omega-3-acid ethyl esters) .Marland Kitchen... 1 by mouth two times a day  Labs Reviewed: SGOT: 24 (12/30/2008)   SGPT: 26 (12/30/2008)   HDL:32 (12/26/2008)  LDL:96 (12/26/2008)  Chol:141  (12/26/2008)  Trig:65 (12/26/2008)  Problem # 4:  ERECTILE DYSFUNCTION, ORGANIC (ICD-607.84)  His updated medication list for this problem includes:    Viagra 100 Mg Tabs (Sildenafil citrate) .Marland Kitchen... 1/2-1 tab 1 hour prior to intercourse as instructed  exac by bBloc use for HTN - despite med tx for HTN and CKD, has been cleared by cards (dr. Johnsie Cancel) and renal (deterding) for med use - advised to continue to review periodically and to seek med attention if any problems occur with its use  Discussed proper use of medications, as well as side effects.   Complete Medication List: 1)  Tekturna 300 Mg Tabs (Aliskiren fumarate) .... Take 1 by mouth qd 2)  Clonidine Hcl 0.3 Mg Tabs (Clonidine hcl) .... Take 1 by mouth three times a day or as directed 3)  Furosemide 80 Mg Tabs (Furosemide) .... Take one tab two times a day 4)  Hydralazine Hcl 50 Mg Tabs (Hydralazine hcl) .... One tablet by mouth three times daily 5)  Labetalol Hcl 200 Mg Tabs (Labetalol hcl) .... Take two tablets twice daily 6)  Micardis 80 Mg Tabs (Telmisartan) .... Take 1 by mouth qd 7)  Aspirin 81 Mg Tbec (Aspirin) .... Take 1 by mouth qd 8)  Imdur 30 Mg Xr24h-tab (Isosorbide mononitrate) .... 1/2 tab by mouth daily 9)  Lovaza 1 Gm Caps (Omega-3-acid ethyl esters) .Marland Kitchen.. 1 by mouth two times a day 10)  Viagra 100 Mg Tabs (Sildenafil citrate) .... 1/2-1 tab 1 hour prior to intercourse as instructed 11)  Klor-con M20 20 Meq Cr-tabs (Potassium chloride crys cr) .... Take 1 by mouth once daily 12)  Calcium 500 Mg Tabs (Calcium) .Marland Kitchen.. 1 by mouth once daily  Patient Instructions: 1)  it was good to see you today. 2)  no medication changes - continue as you are taking - 3)  refill on viagra with coupon provided- 4)  remind dr. Jimmy Footman to fax copies of labs to our office - thanks! 5)  Please schedule a follow-up appointment in 3 months, sooner if problems. come in AM fasting so we can do cholesterol check (unless this is done by  renal(deterding) before that time) Prescriptions: VIAGRA 100 MG TABS (SILDENAFIL CITRATE) 1/2-1 tab 1 hour prior to intercourse as instructed  #6 Tablet x 0   Entered and Authorized by:   Rowe Clack MD   Signed by:   Rowe Clack MD on 10/27/2009   Method used:   Print then Give to Patient   RxID:   [id number]

## 2010-07-20 NOTE — Letter (Signed)
Summary: [facility]   Imported By: Phillis Knack 09/22/2009 07:36:21  _____________________________________________________________________  External Attachment:    Type:   Image     Comment:   External Document

## 2010-07-20 NOTE — Assessment & Plan Note (Signed)
Summary: 3 MOS F/U #/ CD   Vital Signs:  Patient profile:   55 year old male Height:      73 inches (185.42 cm) Weight:      223.8 pounds (101.73 kg) O2 Sat:      96 % on Room air Temp:     98.2 degrees F (36.78 degrees C) oral Pulse rate:   62 / minute BP sitting:   140 / 88  (left arm) Cuff size:   large  Vitals Entered By: Tomma Lightning RMA (January 31, 2010 9:26 AM)  O2 Flow:  Room air CC: 3 month follow-up Is Patient Diabetic? No Pain Assessment Patient in pain? no        Primary Care Provider:  Rowe Clack MD  CC:  3 month follow-up.  History of Present Illness: here for 3 mo followup  1) HTN - reports compliance with ongoing medical treatment and no changes in medication dose or frequency. denies adverse side effects related to current therapy. running high BP last few days but no CP, HA or SOB. follows with dr. Johnsie Cancel for same - now will see him every 6-12 mos.  2) dyslipidemia reports compliance with ongoing medical treatment and no changes in medication dose or frequency. denies adverse side effects related to current therapy.   3) CKD -renal insuff - follows with renal deterding for same - upcoming OV this week (rescheduled) - Cr stablizing 2.8 range - also now on calcium - no edema, no skin changes  4) ED - ok'd for viagra by cards - requesting higher dose as only gets 4 at time  Clinical Review Panels:  Prevention   Last Colonoscopy:  DONE (10/12/2009)  Immunizations   Last Tetanus Booster:  Tdap (01/25/2009)   Last Flu Vaccine:  Fluvax 3+ (04/26/2009)  Lipid Management   Cholesterol:  141 (12/26/2008)   LDL (bad choesterol):  96 (12/26/2008)   HDL (good cholesterol):  32 (12/26/2008)   Triglycerides:  65 (12/26/2008)  CBC   WBC:  5.9 (12/15/2009)   RBC:  4.12 (12/15/2009)   Hgb:  12.1 (12/15/2009)   Hct:  35.1 (12/15/2009)   Platelets:  206 (12/15/2009)   MCV  82.7 (01/25/2009)   MCHC  34.9 (01/25/2009)   RDW  12.3 (01/25/2009)  PMN:  55.9 (01/25/2009)   Lymphs:  32.8 (01/25/2009)   Monos:  7.3 (01/25/2009)   Eosinophils:  3.4 (01/25/2009)   Basophil:  0.6 (01/25/2009)  Complete Metabolic Panel   Glucose:  93 (12/15/2009)   Sodium:  139 (12/15/2009)   Potassium:  4.0 (12/15/2009)   Chloride:  102 (12/15/2009)   CO2:  28 (12/15/2009)   BUN:  27 (12/15/2009)   Creatinine:  2.78 (12/15/2009)   Albumin:  4.1 (12/15/2009)   Total Protein:  6.8 (12/15/2009)   Calcium:  9.1 (12/15/2009)   Total Bili:  0.3 (12/15/2009)   Alk Phos:  80 (12/15/2009)   SGPT (ALT):  20 (12/15/2009)   SGOT (AST):  36 (12/15/2009)   Current Medications (verified): 1)  Tekturna 300 Mg Tabs (Aliskiren Fumarate) .... Take 1 By Mouth Qd 2)  Clonidine Hcl 0.3 Mg Tabs (Clonidine Hcl) .... Take 1 By Mouth Three Times A Day or As Directed 3)  Furosemide 80 Mg Tabs (Furosemide) .... Take One Tab Two Times A Day 4)  Hydralazine Hcl 50 Mg Tabs (Hydralazine Hcl) .... One Tablet By Mouth Three Times Daily 5)  Labetalol Hcl 200 Mg Tabs (Labetalol Hcl) .... Take Two Tablets Twice  Daily 6)  Micardis 80 Mg Tabs (Telmisartan) .... Take 1 By Mouth Qd 7)  Aspirin 81 Mg Tbec (Aspirin) .... Take 1 By Mouth Qd 8)  Imdur 30 Mg Xr24h-Tab (Isosorbide Mononitrate) .... 1/2 Tab By Mouth Daily 9)  Lovaza 1 Gm Caps (Omega-3-Acid Ethyl Esters) .Marland Kitchen.. 1 By Mouth Two Times A Day 10)  Viagra 100 Mg Tabs (Sildenafil Citrate) .... 1/2-1 Tab 1 Hour Prior To Intercourse As Instructed 11)  Klor-Con M20 20 Meq Cr-Tabs (Potassium Chloride Crys Cr) .... Take 1 By Mouth Once Daily 12)  Calcium 500 Mg Tabs (Calcium) .Marland Kitchen.. 1 By Mouth Once Daily  Allergies (verified): No Known Drug Allergies  Past History:  Past Medical History: CHRONIC SYSTOLIC HEART FAILURE (123456) DYSLIPIDEMIA HYPERTENSION (ICD-401.9), malignant CHRONIC KIDNEY DISEASE STAGE III (MODERATE)  baseline Cr 2.6-3.0 07/2009 HYPERPARATHYROIDISM, HX OF NICM - LVEF 30% - echo 7/10  MD roster:   renal -  deterding cards - nishan  Review of Systems  The patient denies weight loss, chest pain, syncope, and peripheral edema.    Physical Exam  General:  fit appearing, approp stated age, alert, well-developed, well-nourished, and cooperative to examination.    Lungs:  normal respiratory effort, no intercostal retractions or use of accessory muscles; normal breath sounds bilaterally - no crackles and no wheezes.    Heart:  normal rate, regular rhythm, no murmur, and no rub. BLE without edema.  Psych:  Oriented X3, memory intact for recent and remote, normally interactive, good eye contact, not anxious appearing, not depressed appearing, and not agitated.      Impression & Recommendations:  Problem # 1:  HYPERTENSION (ICD-401.9)  His updated medication list for this problem includes:    Tekturna 300 Mg Tabs (Aliskiren fumarate) .Marland Kitchen... Take 1 by mouth qd    Clonidine Hcl 0.3 Mg Tabs (Clonidine hcl) .Marland Kitchen... Take 1 by mouth three times a day or as directed    Furosemide 80 Mg Tabs (Furosemide) .Marland Kitchen... Take one tab two times a day    Hydralazine Hcl 50 Mg Tabs (Hydralazine hcl) ..... One tablet by mouth three times daily    Labetalol Hcl 200 Mg Tabs (Labetalol hcl) .Marland Kitchen... Take two tablets twice daily    Micardis 80 Mg Tabs (Telmisartan) .Marland Kitchen... Take 1 by mouth qd  BP today: 140/88 Prior BP: 132/90 (10/27/2009)  Labs Reviewed: K+: 4.0 (12/15/2009) Creat: : 2.78 (12/15/2009)   Chol: 141 (12/26/2008)   HDL: 32 (12/26/2008)   LDL: 96 (12/26/2008)   TG: 65 (12/26/2008)  Problem # 2:  DYSLIPIDEMIA (ICD-272.4)  His updated medication list for this problem includes:    Lovaza 1 Gm Caps (Omega-3-acid ethyl esters) .Marland Kitchen... 1 by mouth two times a day  Labs Reviewed: SGOT: 36 (12/15/2009)   SGPT: 20 (12/15/2009)   HDL:32 (12/26/2008)  LDL:96 (12/26/2008)  Chol:141 (12/26/2008)  Trig:65 (12/26/2008)  Orders: TLB-Lipid Panel (80061-LIPID)  Problem # 3:  CHRONIC KIDNEY DISEASE STAGE III (MODERATE)  (ICD-585.3)  Baseline Cr around 2.9. continue to follow with renal  (Deterding)  Low protien + low sodium diet  Labs Reviewed: BUN: 31 (07/26/2009)   Cr: 3.0 (07/26/2009)    Hgb: 12.5 (01/25/2009)   Hct: 35.7 (01/25/2009)   Ca++: 9.0 (07/26/2009)   Phos: 3.8 (07/26/2009) TP: 5.7 (12/30/2008)   Alb: 3.8 (07/26/2009)  Labs Reviewed: BUN: 27 (12/15/2009)   Cr: 2.78 (12/15/2009)    Hgb: 12.1 (12/15/2009)   Hct: 35.1 (12/15/2009)   Ca++: 9.1 (12/15/2009)   Phos: 3.8 (07/26/2009) TP: 6.8 (12/15/2009)  Alb: 4.1 (12/15/2009)  Problem # 4:  ERECTILE DYSFUNCTION, ORGANIC (ICD-607.84)  His updated medication list for this problem includes:    Viagra 100 Mg Tabs (Sildenafil citrate) .Marland Kitchen... 1/2-1 tab 1 hour prior to intercourse as instructed  exac by bBloc use for HTN - despite med tx for HTN and CKD, has been cleared by cards (dr. Johnsie Cancel) and renal (deterding) for med use - advised to continue to review periodically and to seek med attention if any problems occur with its use  Discussed proper use of medications, as well as side effects.   Complete Medication List: 1)  Tekturna 300 Mg Tabs (Aliskiren fumarate) .... Take 1 by mouth qd 2)  Clonidine Hcl 0.3 Mg Tabs (Clonidine hcl) .... Take 1 by mouth three times a day or as directed 3)  Furosemide 80 Mg Tabs (Furosemide) .... Take one tab two times a day 4)  Hydralazine Hcl 50 Mg Tabs (Hydralazine hcl) .... One tablet by mouth three times daily 5)  Labetalol Hcl 200 Mg Tabs (Labetalol hcl) .... Take two tablets twice daily 6)  Micardis 80 Mg Tabs (Telmisartan) .... Take 1 by mouth qd 7)  Aspirin 81 Mg Tbec (Aspirin) .... Take 1 by mouth qd 8)  Imdur 30 Mg Xr24h-tab (Isosorbide mononitrate) .... 1/2 tab by mouth daily 9)  Lovaza 1 Gm Caps (Omega-3-acid ethyl esters) .Marland Kitchen.. 1 by mouth two times a day 10)  Viagra 100 Mg Tabs (Sildenafil citrate) .... 1/2-1 tab 1 hour prior to intercourse as instructed 11)  Klor-con M20 20 Meq Cr-tabs (Potassium  chloride crys cr) .... Take 1 by mouth once daily 12)  Calcium 500 Mg Tabs (Calcium) .Marland Kitchen.. 1 by mouth once daily  Patient Instructions: 1)  it was good to see you today. 2)  no medication changes today - continue all as you are taking - 3)  test(s) ordered today - your results will be posted on the phone tree for review in 48-72 hours from the time of test completion; call [phone] and enter your 9 digit MRN (listed above on this page, just below your name); if any changes need to be made or there are abnormal results, you will be contacted directly.  4)  Please schedule a follow-up appointment in 3 months, sooner if problems.

## 2010-08-12 IMAGING — CR DG CHEST 2V
2 series · 2 of 2 positions shown · non-contrast
Comparison: None.

CLINICAL DATA: Some cough and shortness of breath.

CHEST - 2 VIEW

[view not recorded (1 of 2)]
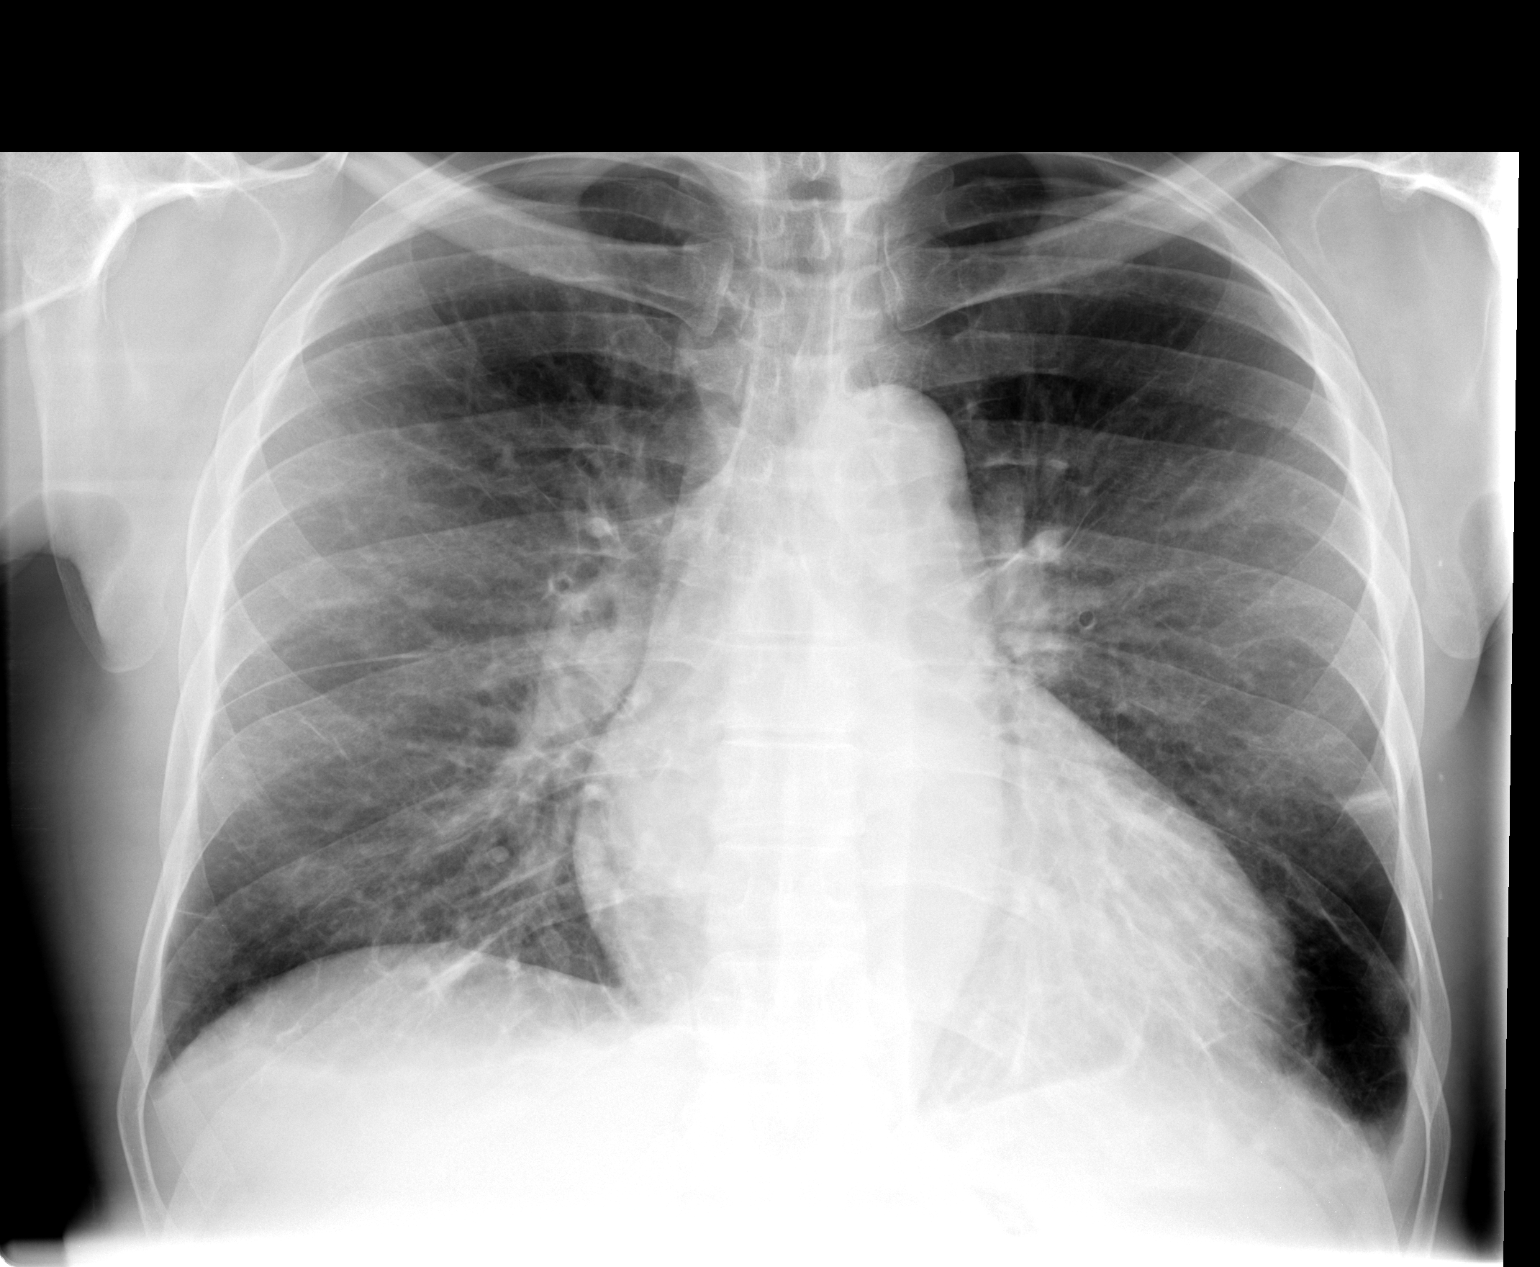

[view not recorded (2 of 2)]
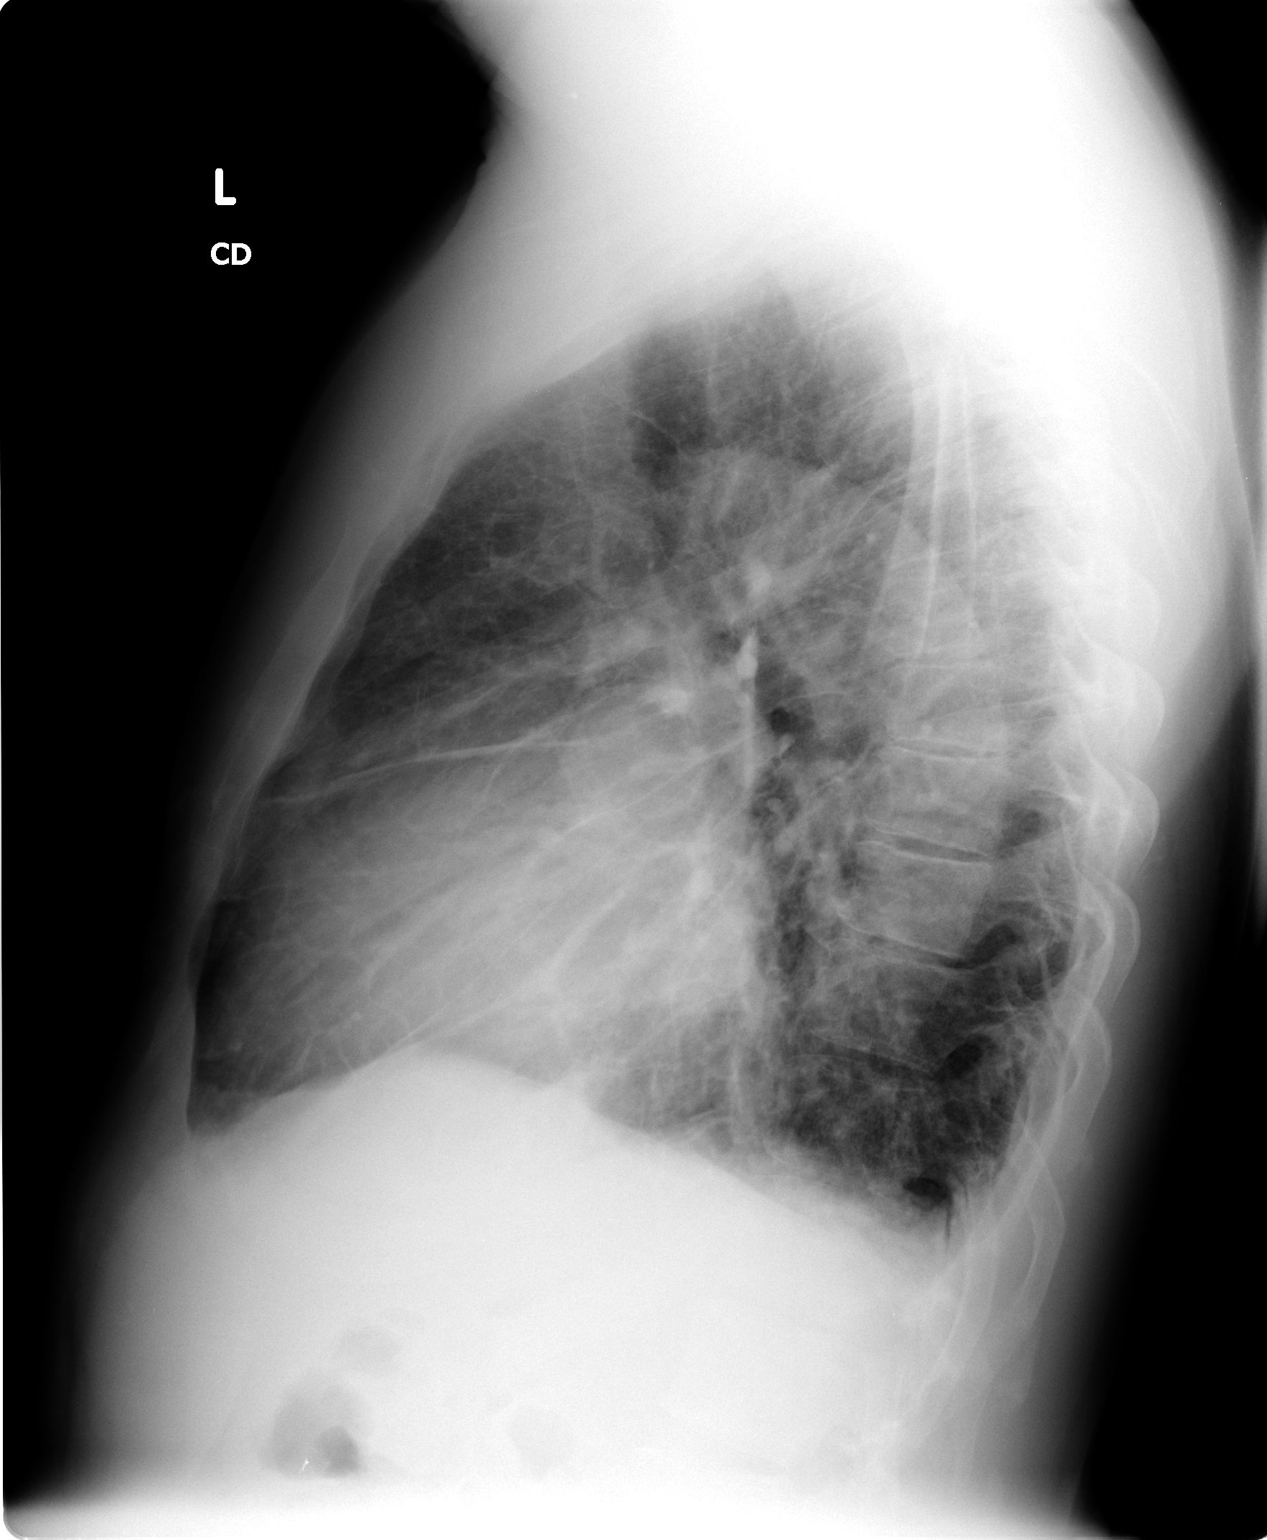

[2 of 2 positions shown; findings below may reference images not displayed]

FINDINGS: Two-view exam shows no dense airspace consolidation, but
there is diffuse interstitial disease and areas of linear streaky
airspace disease in both lower lungs.  A tiny bilateral pleural
effusions are evident.  Cardiopericardial silhouette is at upper
limits of normal for size. Imaged bony structures of the thorax are
intact.
IMPRESSION: Streaky airspace disease in both lung bases with associated tiny
bilateral pleural effusions.  Atypical pneumonia or subtle
bronchopneumonia could produce this appearance.

## 2010-09-23 LAB — CARDIAC PANEL(CRET KIN+CKTOT+MB+TROPI)
CK, MB: 5.2 ng/mL — ABNORMAL HIGH (ref 0.3–4.0)
Relative Index: 1.3 (ref 0.0–2.5)
Relative Index: 1.5 (ref 0.0–2.5)
Total CK: 355 U/L — ABNORMAL HIGH (ref 7–232)
Total CK: 398 U/L — ABNORMAL HIGH (ref 7–232)
Troponin I: 0.03 ng/mL (ref 0.00–0.06)
Troponin I: 0.03 ng/mL (ref 0.00–0.06)
Troponin I: 0.04 ng/mL (ref 0.00–0.06)

## 2010-09-23 LAB — POCT I-STAT, CHEM 8
BUN: 45 mg/dL — ABNORMAL HIGH (ref 6–23)
Calcium, Ion: 1.18 mmol/L (ref 1.12–1.32)
Chloride: 105 mEq/L (ref 96–112)
Creatinine, Ser: 3.5 mg/dL — ABNORMAL HIGH (ref 0.4–1.5)
Glucose, Bld: 98 mg/dL (ref 70–99)
HCT: 36 % — ABNORMAL LOW (ref 39.0–52.0)
Hemoglobin: 12.2 g/dL — ABNORMAL LOW (ref 13.0–17.0)
Potassium: 4 mEq/L (ref 3.5–5.1)
Sodium: 141 mEq/L (ref 135–145)
TCO2: 27 mmol/L (ref 0–100)

## 2010-09-23 LAB — URINALYSIS, ROUTINE W REFLEX MICROSCOPIC
Bilirubin Urine: NEGATIVE
Glucose, UA: NEGATIVE mg/dL
Hgb urine dipstick: NEGATIVE
Ketones, ur: NEGATIVE mg/dL
Nitrite: NEGATIVE
Protein, ur: NEGATIVE mg/dL
Specific Gravity, Urine: 1.007 (ref 1.005–1.030)
Urobilinogen, UA: 0.2 mg/dL (ref 0.0–1.0)
pH: 7 (ref 5.0–8.0)

## 2010-09-23 LAB — DIFFERENTIAL
Basophils Absolute: 0 10*3/uL (ref 0.0–0.1)
Basophils Relative: 0 % (ref 0–1)
Eosinophils Absolute: 0.1 10*3/uL (ref 0.0–0.7)
Eosinophils Relative: 3 % (ref 0–5)
Lymphocytes Relative: 25 % (ref 12–46)
Lymphs Abs: 1.2 10*3/uL (ref 0.7–4.0)
Monocytes Absolute: 0.3 10*3/uL (ref 0.1–1.0)
Monocytes Relative: 7 % (ref 3–12)
Neutro Abs: 3.2 10*3/uL (ref 1.7–7.7)
Neutrophils Relative %: 65 % (ref 43–77)

## 2010-09-23 LAB — CBC
HCT: 36 % — ABNORMAL LOW (ref 39.0–52.0)
Hemoglobin: 12.3 g/dL — ABNORMAL LOW (ref 13.0–17.0)
MCHC: 34.2 g/dL (ref 30.0–36.0)
MCV: 82.6 fL (ref 78.0–100.0)
Platelets: 160 10*3/uL (ref 150–400)
RBC: 4.36 MIL/uL (ref 4.22–5.81)
RDW: 13.4 % (ref 11.5–15.5)
WBC: 4.9 10*3/uL (ref 4.0–10.5)

## 2010-09-23 LAB — POCT CARDIAC MARKERS
CKMB, poc: 3.3 ng/mL (ref 1.0–8.0)
Myoglobin, poc: 417 ng/mL (ref 12–200)
Troponin i, poc: <0.05 ng/mL (ref 0.00–0.09)

## 2010-09-23 LAB — BASIC METABOLIC PANEL
Calcium: 8.8 mg/dL (ref 8.4–10.5)
Chloride: 106 mEq/L (ref 96–112)
Creatinine, Ser: 2.87 mg/dL — ABNORMAL HIGH (ref 0.4–1.5)
GFR calc Af Amer: 28 mL/min — ABNORMAL LOW (ref >60)
Sodium: 142 mEq/L (ref 135–145)

## 2010-09-23 LAB — CALCIUM: Calcium: 9.1 mg/dL (ref 8.4–10.5)

## 2010-09-23 LAB — MAGNESIUM: Magnesium: 2.2 mg/dL (ref 1.5–2.5)

## 2010-09-24 LAB — BASIC METABOLIC PANEL
BUN: 25 mg/dL — ABNORMAL HIGH (ref 6–23)
CO2: 23 mEq/L (ref 19–32)
Chloride: 107 mEq/L (ref 96–112)
GFR calc non Af Amer: 31 mL/min — ABNORMAL LOW (ref >60)
Glucose, Bld: 109 mg/dL — ABNORMAL HIGH (ref 70–99)
Potassium: 3.6 mEq/L (ref 3.5–5.1)

## 2010-09-24 LAB — RENAL FUNCTION PANEL
BUN: 32 mg/dL — ABNORMAL HIGH (ref 6–23)
Calcium: 8.8 mg/dL (ref 8.4–10.5)
Calcium: 8.8 mg/dL (ref 8.4–10.5)
GFR calc Af Amer: 31 mL/min — ABNORMAL LOW (ref >60)
GFR calc non Af Amer: 26 mL/min — ABNORMAL LOW (ref >60)
Glucose, Bld: 91 mg/dL (ref 70–99)
Phosphorus: 3.8 mg/dL (ref 2.3–4.6)
Phosphorus: 4.2 mg/dL (ref 2.3–4.6)
Potassium: 3.7 mEq/L (ref 3.5–5.1)
Sodium: 141 mEq/L (ref 135–145)

## 2010-09-24 LAB — CBC
HCT: 34 % — ABNORMAL LOW (ref 39.0–52.0)
MCHC: 33.3 g/dL (ref 30.0–36.0)
MCHC: 33.5 g/dL (ref 30.0–36.0)
MCV: 85 fL (ref 78.0–100.0)
MCV: 86.2 fL (ref 78.0–100.0)
Platelets: 202 10*3/uL (ref 150–400)
RBC: 3.94 MIL/uL — ABNORMAL LOW (ref 4.22–5.81)
RDW: 13.4 % (ref 11.5–15.5)

## 2010-09-24 LAB — LIPID PANEL
Cholesterol: 141 mg/dL (ref 0–200)
LDL Cholesterol: 96 mg/dL (ref 0–99)

## 2010-09-24 LAB — COMPREHENSIVE METABOLIC PANEL
AST: 24 U/L (ref 0–37)
AST: 32 U/L (ref 0–37)
Albumin: 3.3 g/dL — ABNORMAL LOW (ref 3.5–5.2)
BUN: 31 mg/dL — ABNORMAL HIGH (ref 6–23)
CO2: 27 mEq/L (ref 19–32)
Calcium: 8.7 mg/dL (ref 8.4–10.5)
Calcium: 9 mg/dL (ref 8.4–10.5)
Creatinine, Ser: 2.29 mg/dL — ABNORMAL HIGH (ref 0.4–1.5)
Creatinine, Ser: 2.66 mg/dL — ABNORMAL HIGH (ref 0.4–1.5)
GFR calc Af Amer: 31 mL/min — ABNORMAL LOW (ref >60)
GFR calc Af Amer: 37 mL/min — ABNORMAL LOW (ref >60)
GFR calc non Af Amer: 25 mL/min — ABNORMAL LOW (ref >60)
GFR calc non Af Amer: 30 mL/min — ABNORMAL LOW (ref >60)

## 2010-09-24 LAB — TROPONIN I
Troponin I: 0.09 ng/mL — ABNORMAL HIGH (ref 0.00–0.06)
Troponin I: 0.11 ng/mL — ABNORMAL HIGH (ref 0.00–0.06)

## 2010-09-24 LAB — PROTEIN / CREATININE RATIO, URINE
Creatinine, Urine: 25.7 mg/dL
Creatinine, Urine: 80.7 mg/dL
Protein Creatinine Ratio: 0.07 (ref 0.00–0.15)
Total Protein, Urine: 6 mg/dL

## 2010-09-24 LAB — URINALYSIS, ROUTINE W REFLEX MICROSCOPIC
Leukocytes, UA: NEGATIVE
Protein, ur: 30 mg/dL — AB
Urobilinogen, UA: 0.2 mg/dL (ref 0.0–1.0)

## 2010-09-24 LAB — DIFFERENTIAL
Eosinophils Relative: 2 % (ref 0–5)
Lymphocytes Relative: 22 % (ref 12–46)
Lymphs Abs: 1.4 10*3/uL (ref 0.7–4.0)
Monocytes Absolute: 0.3 10*3/uL (ref 0.1–1.0)

## 2010-09-24 LAB — PTH, INTACT AND CALCIUM: PTH: 198.9 pg/mL — ABNORMAL HIGH (ref 14.0–72.0)

## 2010-09-24 LAB — URINE MICROSCOPIC-ADD ON

## 2010-09-24 LAB — PROTEIN ELECTROPH W RFLX QUANT IMMUNOGLOBULINS
Beta 2: 5.2 % (ref 3.2–6.5)
Gamma Globulin: 15.8 % (ref 11.1–18.8)
M-Spike, %: NOT DETECTED g/dL

## 2010-09-24 LAB — CREATININE, URINE, RANDOM: Creatinine, Urine: 25.7 mg/dL

## 2010-09-24 LAB — TSH: TSH: 2.631 u[IU]/mL (ref 0.350–4.500)

## 2010-09-24 LAB — CK TOTAL AND CKMB (NOT AT ARMC): Total CK: 234 U/L — ABNORMAL HIGH (ref 7–232)

## 2010-09-24 LAB — CARDIAC PANEL(CRET KIN+CKTOT+MB+TROPI)
CK, MB: 3.9 ng/mL (ref 0.3–4.0)
Relative Index: 2.3 (ref 0.0–2.5)

## 2010-09-24 LAB — MAGNESIUM: Magnesium: 2.1 mg/dL (ref 1.5–2.5)

## 2010-09-28 ENCOUNTER — Other Ambulatory Visit: Payer: Self-pay | Admitting: Internal Medicine

## 2010-09-28 IMAGING — US US RENAL
1 series · 14 of 25 positions shown · non-contrast
Comparison: 05/02/2005 study

CLINICAL DATA: History given of hypertension.

RENAL/URINARY TRACT ULTRASOUND COMPLETE

[Series 1: us renal · 0.28mm/px · 14 of 29 slices shown]
[im 1/29]
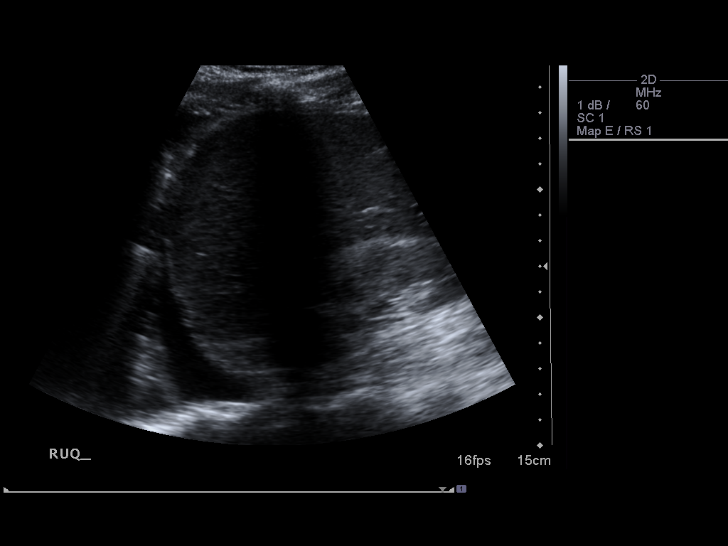
[im 3/29]
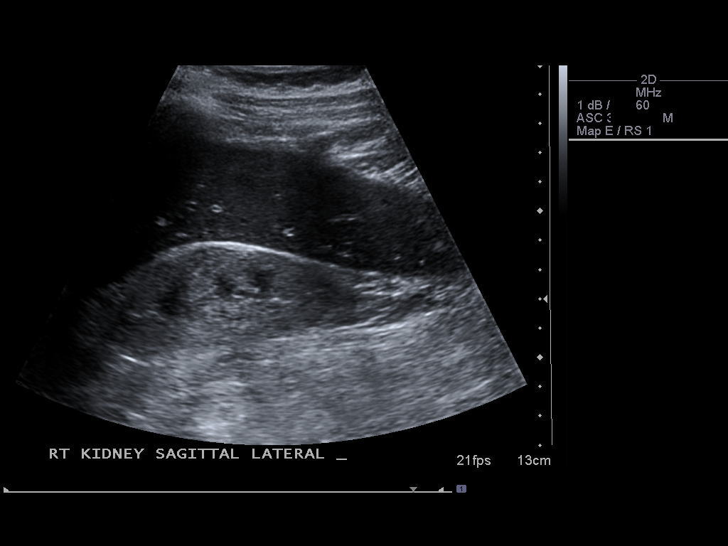
[im 5/29]
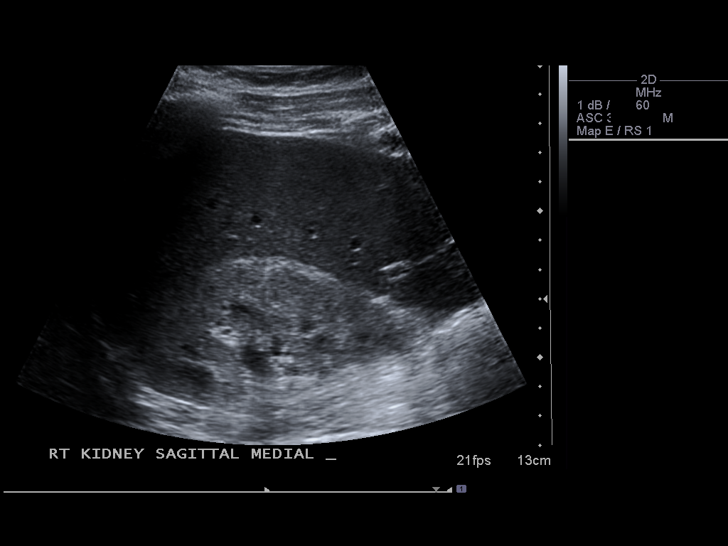
[im 8/29]
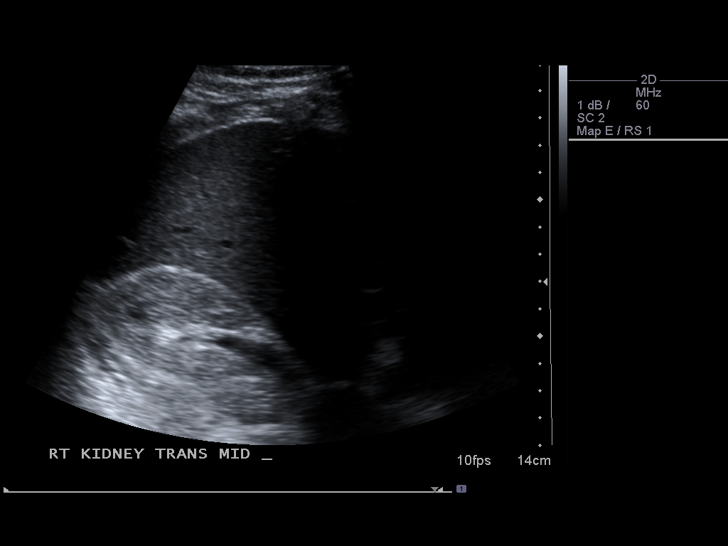
[im 10/29]
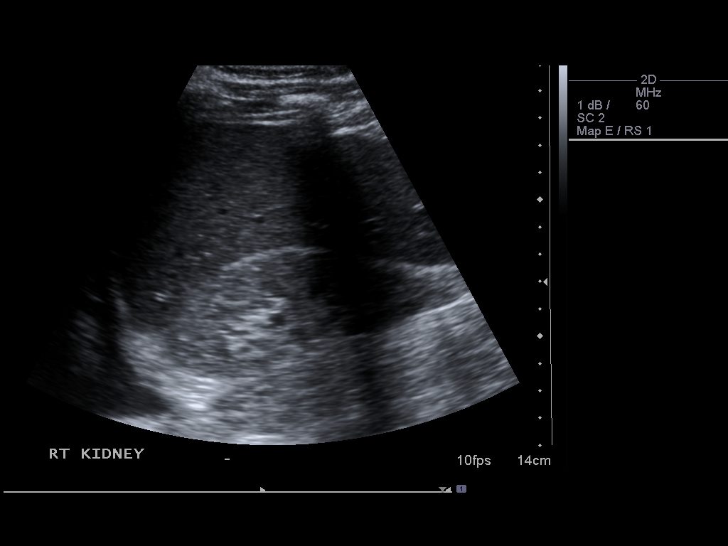
[im 11/29]
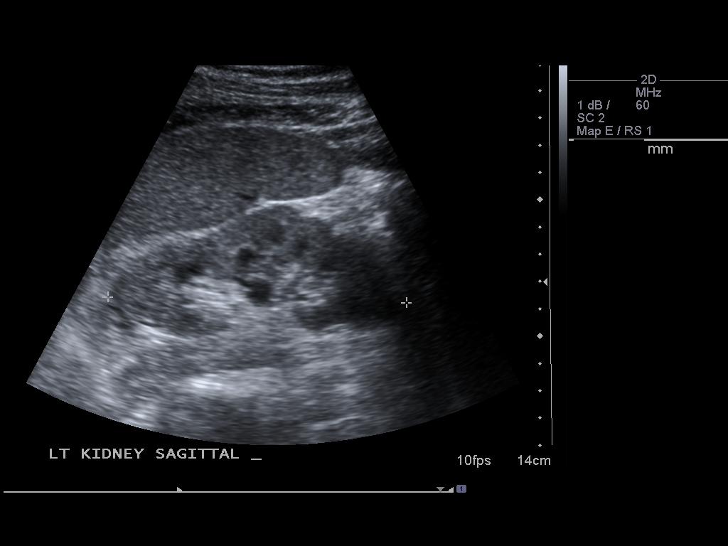
[im 13/29]
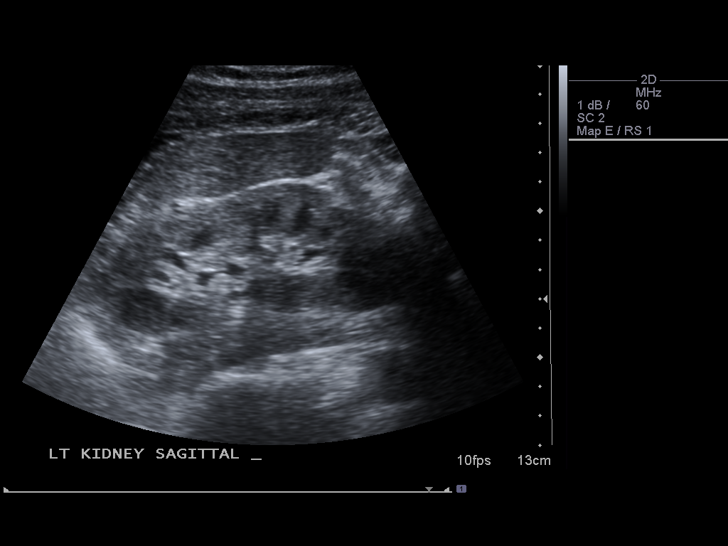
[im 16/29]
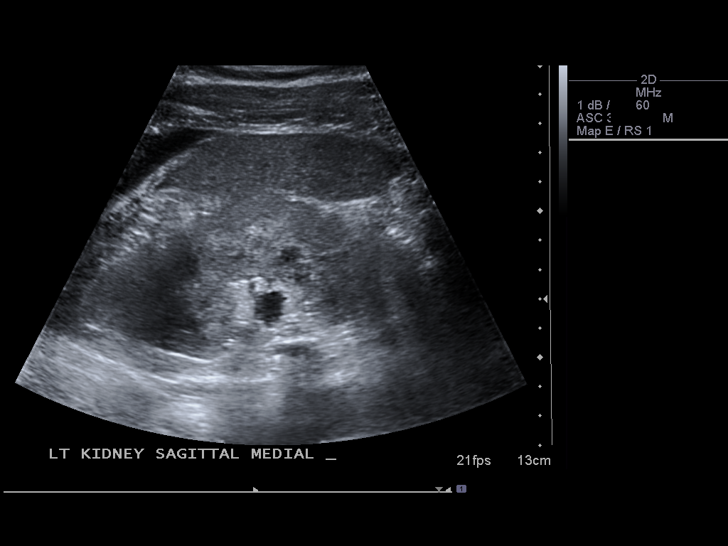
[im 18/29]
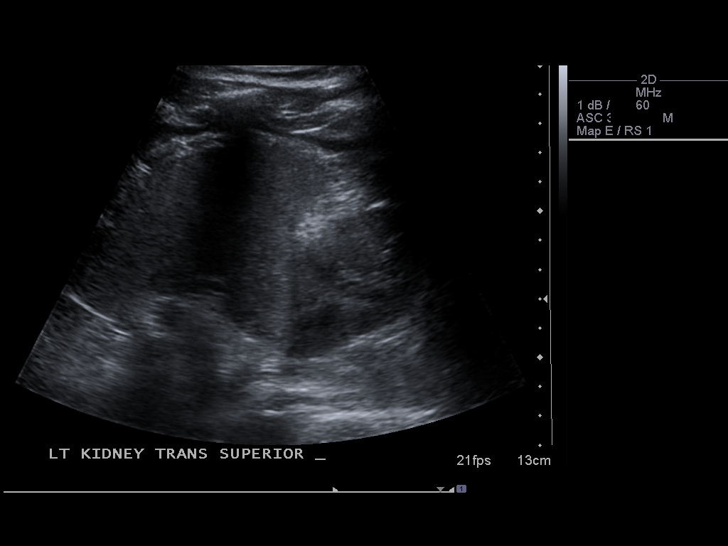
[im 19/29]
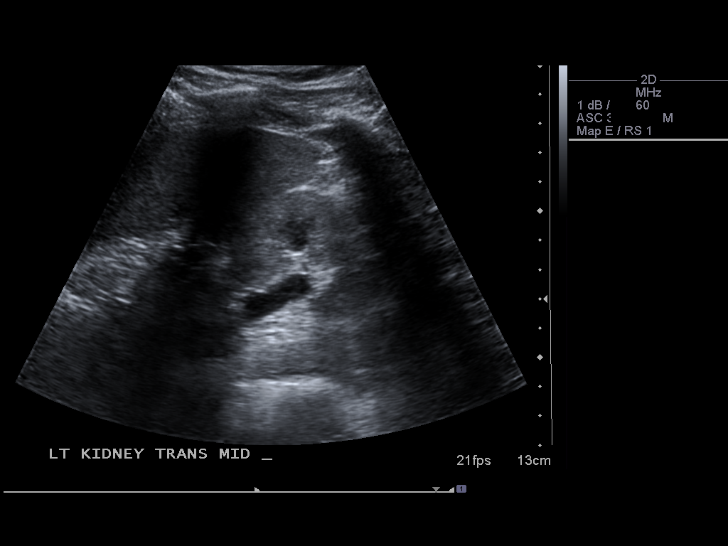
[im 22/29]
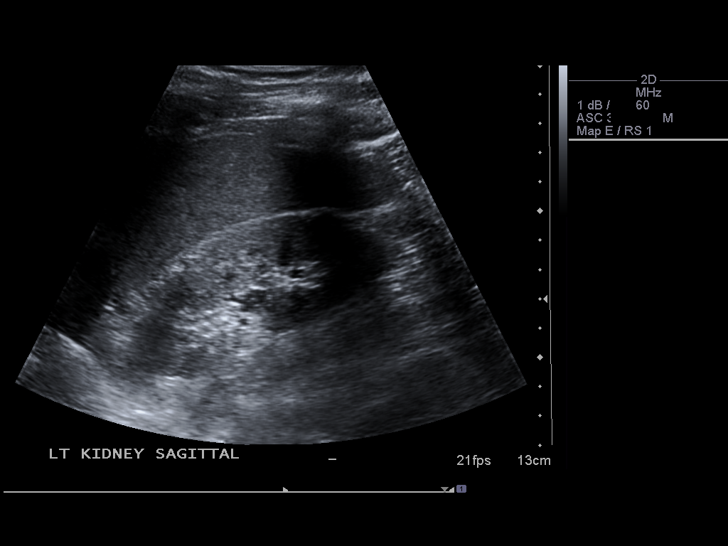
[im 24/29]
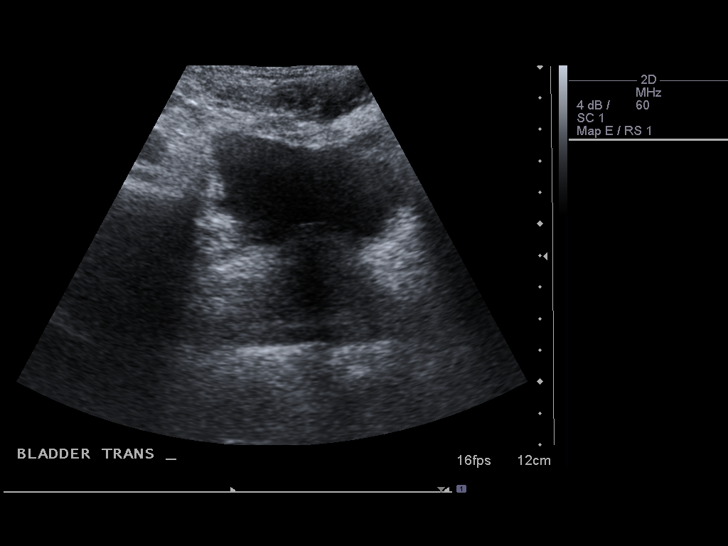
[im 26/29]
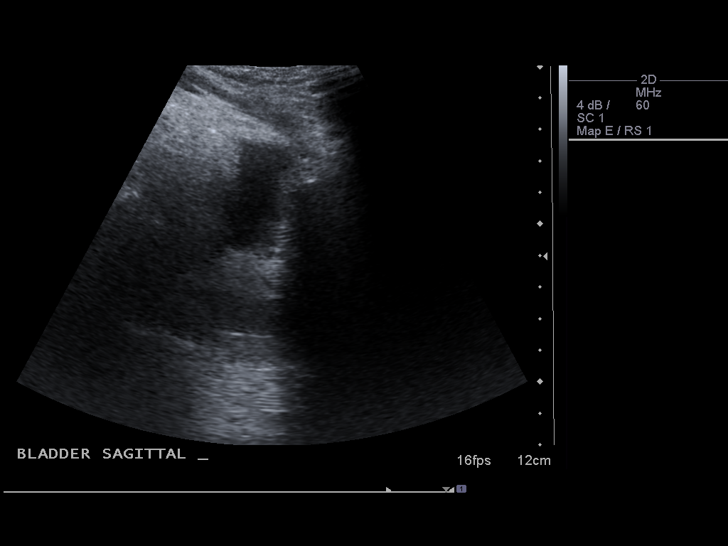
[im 29/29]
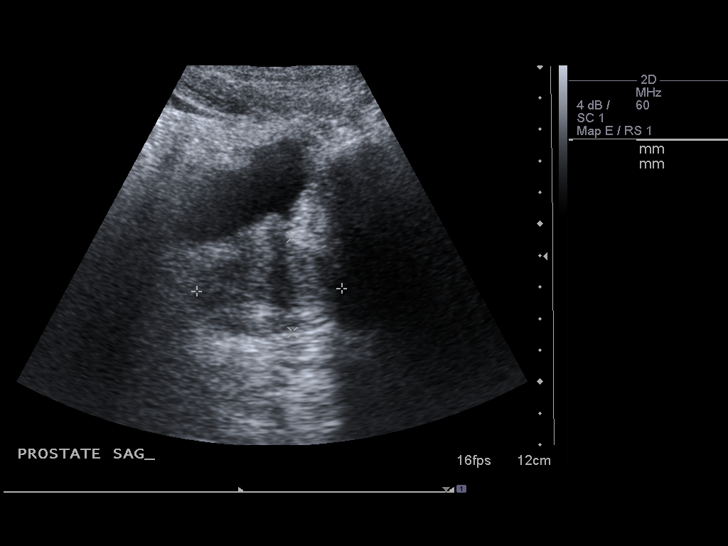

[14 of 25 positions shown; findings below may reference images not displayed]

FINDINGS: Right Kidney:  Right renal length is 10.1 cm. Right kidney is
hyperechoic compared to the adjacent liver.

Left Kidney:  Left renal length is 10.1 cm. Left kidney is slightly
hyperechoic compared to adjacent spleen.

No hydronephrosis, mass, cyst, parenchymal loss, or calculus is
evident.

Bladder:  No bladder abnormality is seen.

Prostate measured 4.6 x 4.0 x 3.0 cm.

Incidental note is made of small bilateral pleural effusions.
IMPRESSION: Kidneys are normal size.  The right kidney has parenchyma which is
hyperechoic compared to the adjacent liver consistent with medical
renal disease.

Left kidney is slightly hyperechoic compared to adjacent spleen
consistent with medical renal disease.

No hydronephrosis, mass, or cyst or calculus was evident.

No bladder abnormality is demonstrated.

Prostatic dimensions are given above.

Incidental note is made of small bilateral pleural effusions.

## 2010-09-28 IMAGING — CR DG CHEST 2V
2 series · 2 of 2 positions shown · non-contrast
Comparison: 12/26/2008 study

CLINICAL DATA: History of hypertension and shortness of breath

CHEST - 2 VIEW

[w chest pa]
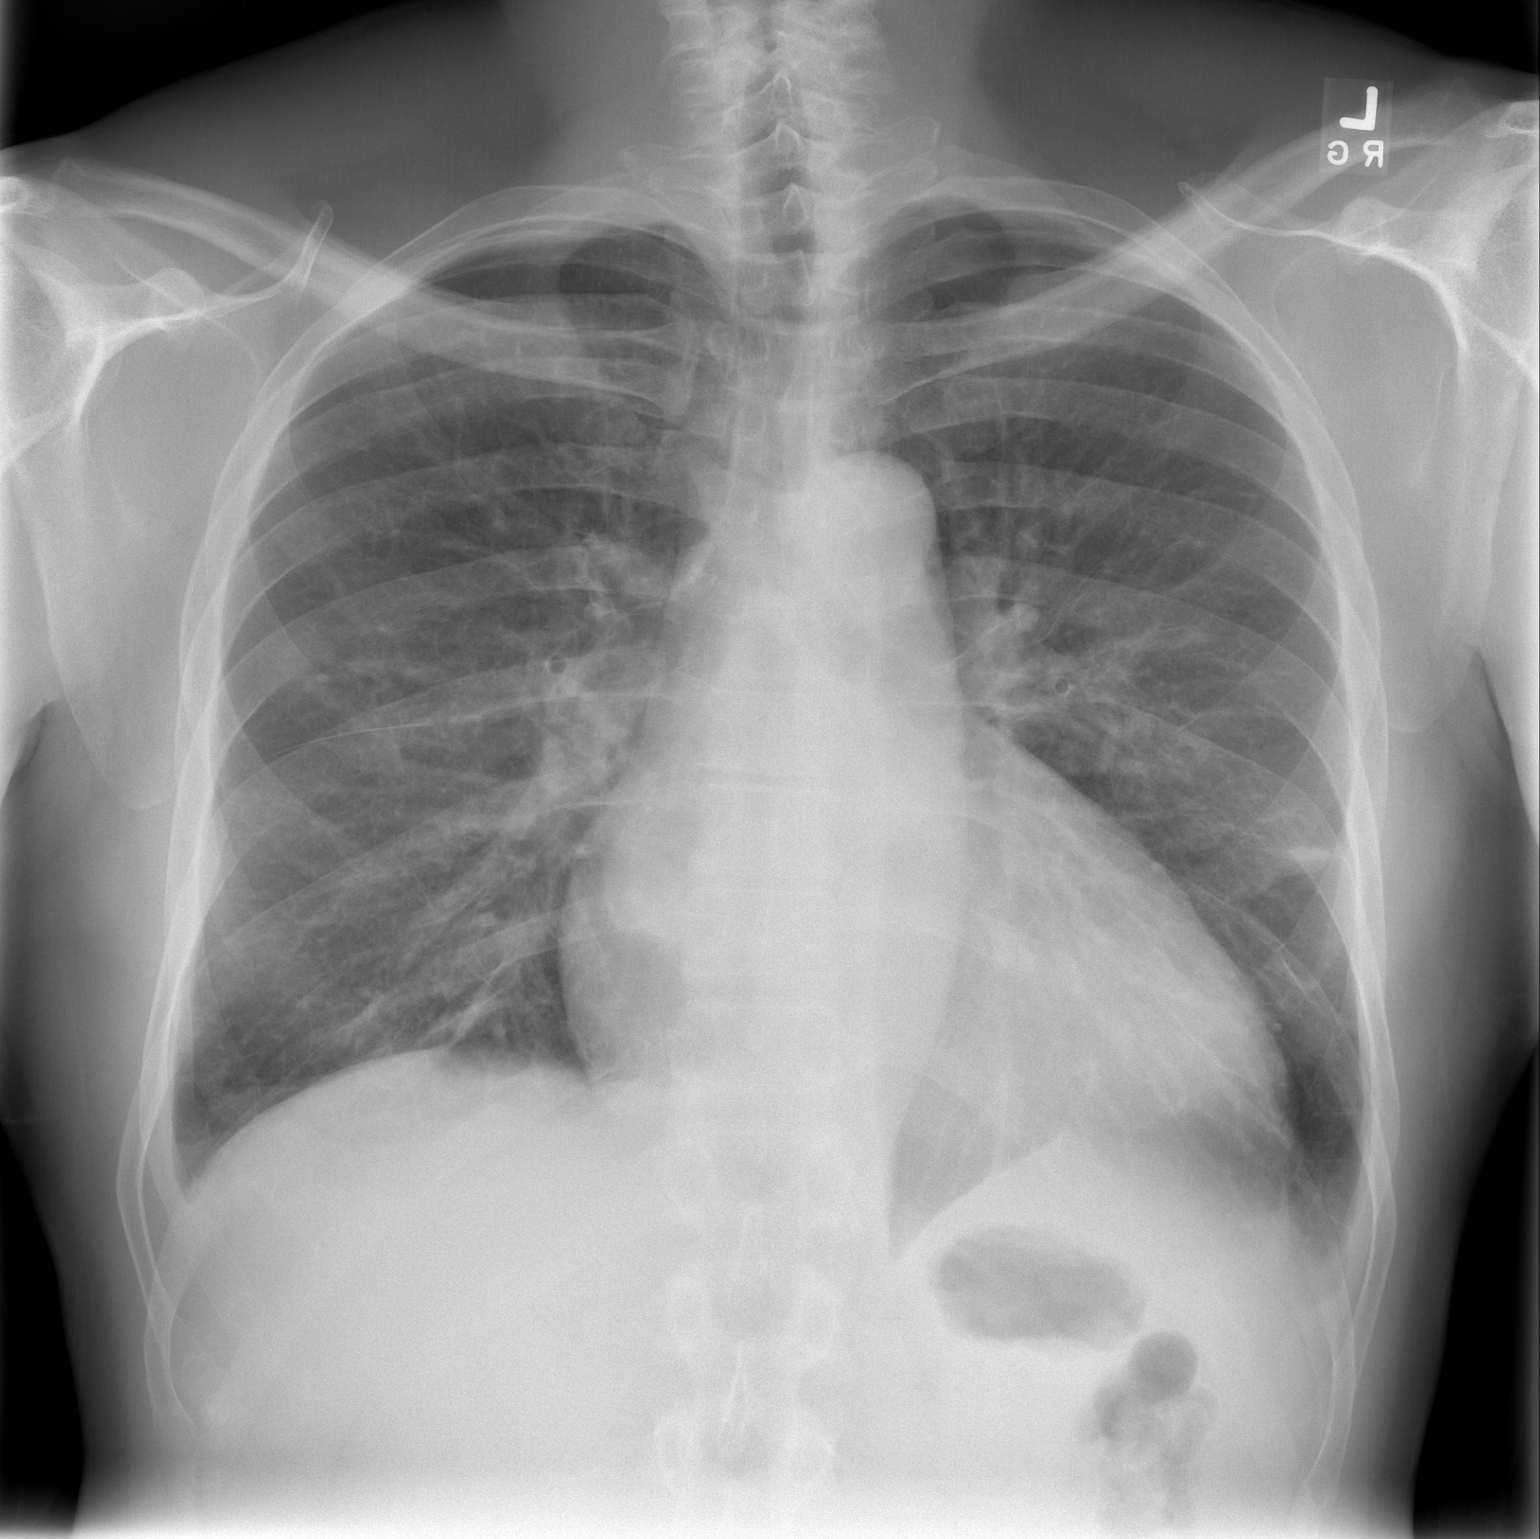

[w chest lat]
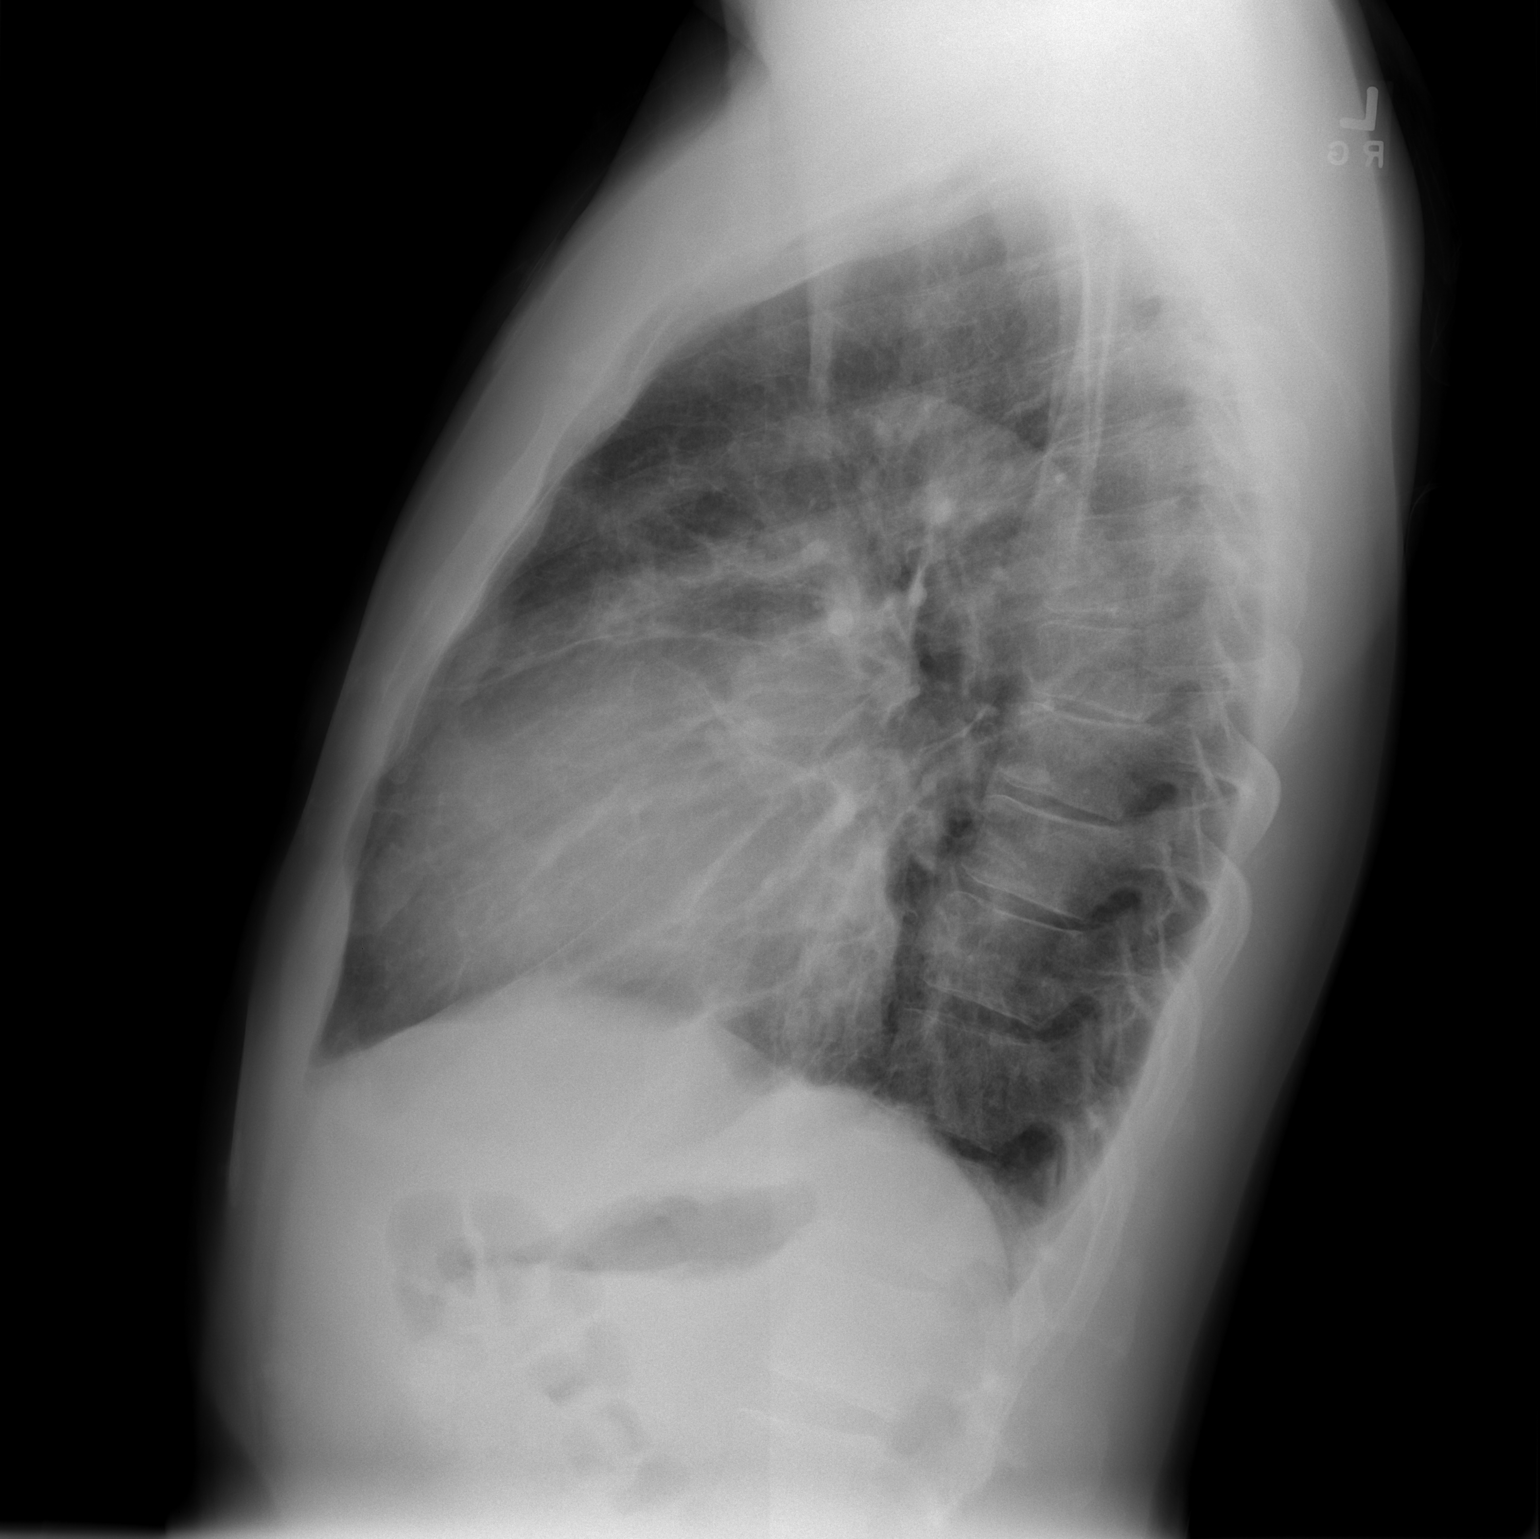

[2 of 2 positions shown; findings below may reference images not displayed]

FINDINGS: There is stable moderate enlargement of the cardiac
silhouette.  Area of subsegmental atelectasis or fibrosis in the
lower left midlung zone is unchanged.  There is decrease in the
infiltrative densities within the lungs.  There is elevation of the
right hemidiaphragm with minimal right basilar atelectasis.  Small
pleural effusions are present.  These have increased laterally
since previous study on PA image. Bones appear average for age. .
IMPRESSION: There is stable moderate enlargement of the cardiac silhouette.
Area of subsegmental atelectasis or fibrosis in the lower left
midlung zone is unchanged.

There is decrease in the infiltrative densities within the lungs.
There is elevation of the right hemidiaphragm with minimal right
basilar atelectasis.

Small pleural effusions are present.  These have increased
laterally since previous study on PA image.

## 2010-10-16 ENCOUNTER — Encounter: Payer: Self-pay | Admitting: Internal Medicine

## 2010-10-17 ENCOUNTER — Ambulatory Visit (INDEPENDENT_AMBULATORY_CARE_PROVIDER_SITE_OTHER): Payer: 59 | Admitting: Internal Medicine

## 2010-10-17 ENCOUNTER — Encounter: Payer: Self-pay | Admitting: Internal Medicine

## 2010-10-17 DIAGNOSIS — I5022 Chronic systolic (congestive) heart failure: Secondary | ICD-10-CM

## 2010-10-17 DIAGNOSIS — I10 Essential (primary) hypertension: Secondary | ICD-10-CM

## 2010-10-17 DIAGNOSIS — N183 Chronic kidney disease, stage 3 unspecified: Secondary | ICD-10-CM

## 2010-10-17 DIAGNOSIS — E785 Hyperlipidemia, unspecified: Secondary | ICD-10-CM

## 2010-10-17 NOTE — Assessment & Plan Note (Signed)
The current medical regimen is effective;  continue present plan and medications.  Also follows with cards and renal for same - due for follow up echo with cards next month - will refer if pt unable to schedule indep

## 2010-10-17 NOTE — Assessment & Plan Note (Signed)
On lovaza - The current medical regimen is effective;  continue present plan and medications.

## 2010-10-17 NOTE — Assessment & Plan Note (Signed)
NICM - (LVEF 30% echo 12/2008 in setting of uncontrolled BP) Euvolemic, symptoms stable - due for follow up echo 11/2010 with cards The current medical regimen is effective;  continue present plan and medications.

## 2010-10-17 NOTE — Assessment & Plan Note (Signed)
Renal OV and labs 09/2010 reviewed today -  Renal fx stable The current medical regimen is effective;  continue present plan and medications.

## 2010-10-17 NOTE — Progress Notes (Signed)
  Subjective:    Patient ID: [name], male    DOB: 10/12/1955, 55 y.o.   MRN: [id number]  HPI  here for follow up - reviewed chronic medical issues today  HTN - reports compliance with ongoing medical treatment and no changes in medication dose or frequency. denies adverse side effects related to current therapy. occassionally elevated BP but no CP, HA or SOB. follows with cards (nishan) and renal (deterding) for same -  Dyslipidemia - on lovaza, not statin - reports compliance with ongoing medical treatment and no changes in medication dose or frequency. denies adverse side effects related to current therapy.   CKD 3-4 -renal insuff - follows with renal (deterding) for same - Cr stablizing 2.8 range - also now on calcium for 2hPTH - no edema, no skin changes  ED - ok'd for viagra by cards - requesting refill as symptoms improved with tx - no CP or syncope with use of med  Past Medical History  Diagnosis Date  . Chronic systolic heart failure     NICM - LVEF 30% echo 12/2008  . ERECTILE DYSFUNCTION, ORGANIC   . ANEMIA   . DYSLIPIDEMIA   . HYPERTENSION   . CHRONIC KIDNEY DISEASE STAGE III (MODERATE)   . Hyperparathyroidism, secondary     Review of Systems  Constitutional: Negative for unexpected weight change.  Respiratory: Negative for shortness of breath.   Cardiovascular: Negative for leg swelling.  Neurological: Negative for headaches.       Objective:   Physical Exam  Constitutional: He is oriented to person, place, and time. He appears well-developed and well-nourished. No distress.  Cardiovascular: Normal rate, regular rhythm and normal heart sounds.   Pulmonary/Chest: Effort normal and breath sounds normal. No respiratory distress. He has no wheezes.  Neurological: He is alert and oriented to person, place, and time. No cranial nerve deficit. Coordination normal.  Psychiatric: He has a normal mood and affect. His behavior is normal. Judgment and thought content  normal.  BP 142/90  Pulse 68  Temp(Src) 98 F (36.7 C) (Oral)  Ht 6\' 1"  (1.854 m)  Wt 215 lb 12.8 oz (97.886 kg)  BMI 28.47 kg/m2  SpO2 97%        Assessment & Plan:  See problem list. Medications and labs reviewed today including renal consult OV 09/2010 Time spent with pt today 25 minutes, greater than 50% time spent counseling patient on hypertension, CKD and medication review.  Due for 6 mo follow up with cards for echo 11/2010 - pt reminded of same - will help schedule if needed

## 2010-10-17 NOTE — Patient Instructions (Signed)
It was good to see you today. Medications reviewed, no changes at this time. Schedule follow up with dr. Johnsie Cancel in 11/2010 [phone]) for office visit AND ECHO (ultrasound of heart) as per his instructions 04/2010 note - if you have trouble making these appointments, let us know and we will make referral for same Continue to follow with dr. deterding as ongoing Please schedule followup in 6 months, call sooner if problems.

## 2010-10-31 NOTE — Consult Note (Signed)
[name], CALDERON NO.:  [id number]   MEDICAL RECORD NO.:  [id number]          PATIENT TYPE:  INP   LOCATION:  1237                         FACILITY:  [facility]   PHYSICIAN:  Windy Kalata, M.D.DATE OF BIRTH:  [birth year]   DATE OF CONSULTATION:  12/27/2008  DATE OF DISCHARGE:                                 CONSULTATION   REASON FOR CONSULTATION:  Chronic kidney disease and hypertension.   HISTORY OF PRESENT ILLNESS:  This is a 55-year black male who is  admitted on December 26, 2008 for a several week history of shortness of  breath and orthopnea.  He thinks he was told by his primary care doctor  his renal function was not quite normal.  He denies any history of gross  hematuria, kidney stone, use of nonsteroidal medications or family  history of renal disease.  He has had high blood pressure for at least  20 years that he says was under good control until 2002 and since then  has been more difficult to control.  He has a home blood pressure  monitor at home but has not been faithfully taking his blood pressures;  it's has been too high.  He is on a no added salt diet.  He did have a  renal ultrasound done in 2006 which showed a right kidney of 10.9 cm and  left kidney of 10.8 tenderness with no hydronephrosis.  In terms of any  workup for secondary causes, it is unclear.  He thinks he may have had  some workup done by his primary care doctor, Dr. Willey Blade.  His  serum creatinine on admission was 2.2.  Urine output overnight was only  805 cc,  despite getting some Lasix.   PAST MEDICAL HISTORY:  1. Hypertension x20 years.  2. Migraines.   ALLERGIES:  1. ACE INHIBITORS CAUSE A COUGH.  2. HE THOUGHT COREG CAUSED SHORTNESS OF BREATH.  3. HE THOUGHT METHYLDOPA CAUSED SHORTNESS OF BREATH.   MEDICATIONS PRIOR TO ADMISSION:  1. Tekturna 300 mg a day.  2. Lasix 40 mg a day.  3. Clonidine 0.3 mg b.i.d.  4. Lovaza 1 gram b.i.d.  5. Potassium chloride  20 mEq a day.  6. Diovan 320 mg a day.  (Currently here, he is on all of those medicines, except for the Diovan  which has been switched to Benicar.  He is also on an aspirin.  He was  on a labetalol drip, although it is off this morning.)   SOCIAL HISTORY:  He is a nonsmoker.  He rarely drinks alcohol.  He is  married and divorced x3.  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   FAMILY HISTORY:  Father had hypertension and congestive heart failure.  Mother had hypertension.  He has two brothers and one sister with  hypertension.  Again, there is no family history of renal disease.   REVIEW OF SYSTEMS:  Appetite has been somewhat diminished over the last  several days.  He has had orthopnea and dyspnea on exertion  as noted  above.  No chest pains or chest pressures.  No recent change in bowel  habits.  No recent change in vision.  No new rashes.  No new neuropathic  symptoms.  No dysuria.  The rest of the review of systems unremarkable.   PHYSICAL EXAMINATION:  VITAL SIGNS:  Blood pressure 119/93, pulse 69,  temperature 98.  GENERAL:  A healthy-appearing 55 year old black male in no acute  distress.  HEENT:  Sclera are nonicteric.  Extraocular muscles are intact.  NECK:  Positive JVD.  LUNGS:  A few basilar crackles.  HEART:  Regular rate and rhythm with an S4 gallop.  ABDOMEN:  Positive bowel sounds, nontender, nondistended.  No  hepatosplenomegaly.  No bruits.  EXTREMITIES:  No clubbing, cyanosis or edema.  Pulses 2/4 and equal  throughout.  NEUROLOGIC:  Cranial nerves intact.  Motor and sensory intact.  No  asterixis.   LABORATORY DATA:  Sodium 137, potassium 3.6, bicarbonate 23, BUN 25,  creatinine 2.2, hemoglobin 12.2, white count 6.2, platelet count 202.   IMPRESSION:  1. Presumably chronic kidney disease, most likely secondary to      hypertension.  Will certainly need to get old records at [facility] and      confirm the  chronicity.  2. Pulmonary edema, most likely related to his hypertension.   RECOMMENDATIONS:  1. I would increase his Lasix to 80 mg b.i.d. for the time being.  2. Would switch him over to labetalol at 400 mg b.i.d. and have his      dietician to come see him for a low-sodium diet.  Will need to get      old records from Dr. Karlton Lemon, check a renal ultrasound to see if      there has been a change in his kidney size.  Will check a serum      protein electrophoresis.  I think we will need to get a feel for      his blood pressure control once his volume is controlled, as he may      very well need a vasodilator, as well.  3. Will recheck labs in the morning with a renal profile.   Thank you very much for the consult.  Will follow the patient with you.      Windy Kalata, M.D.     MTM/MEDQ  D:  12/27/2008  T:  12/27/2008  Job:  [id number]

## 2010-10-31 NOTE — H&P (Signed)
[name], [name] NO.:  [id number]   MEDICAL RECORD NO.:  [id number]          PATIENT TYPE:  EMS   LOCATION:  ED                           FACILITY:  [facility]   PHYSICIAN:  Aurea Graff, MD   DATE OF BIRTH:  1956-01-15   DATE OF ADMISSION:  12/26/2008  DATE OF DISCHARGE:                              HISTORY & PHYSICAL   PRIMARY CARE PHYSICIAN:  Royetta Crochet. Karlton Lemon, M.D.   CHIEF COMPLAINT:  Shortness of breath.   HISTORY OF PRESENT ILLNESS:  This is a 55 year old African American man  who was diagnosed with hypertension approximately 30 years ago.  He felt  well with relatively well-controlled blood pressures until approximately  2002, at which point his blood pressure by his report became impossible  to control.  He has been tried on almost every oral agent, several of  which he has been unable to tolerate for various reasons.  For the past  4 or 5 months he has had increasing shortness of breath.  He initially  noticed dyspnea only when trying to work out - which included jogging  several miles and using the punching bag - but over the past several  months he has had to stop working out because he gets so short of  breath.  In fact, over the past month or so he has developed orthopnea,  PND and significant lower extremity edema.  He was started on Lasix  within the past few weeks and promptly lost over 20 pounds, but despite  this the symptoms have progressed.  When he tries to lie flat to sleep  he wakes up gurgling and coughing and gasping for air.  He can only  get relief if he sits in a chair or stands upright.  This is causing him  a great deal of anxiety and in fact he thought he was having panic  attacks.  The reason he finally came to the emergency rooms was because  he was supposed to leave on a vacation tomorrow, but was quite concerned  that he would get into serious health problems at the beach, and so  wanted to get checked out first.   PAST  MEDICAL HISTORY:  1. Severe hypertension.  2. Headaches.   SOCIAL HISTORY:  Lives in [place].  He has been married and divorced  three times.  He is a lifelong nonsmoker, very rarely uses alcohol.  He  is a Engineer, structural with the [employer]  Does not use  drugs.  He used to be a robust exerciser, but has been limited by  dyspnea for the past several months and is unable to exercise.   FAMILY HISTORY:  Mother and father both living, both have hypertension.  Father has congestive heart failure.  The patient has two brothers and  one sister all of whom have hypertension.   ALLERGIES:  ACE INHIBITOR caused a cough.  He really has no true  allergies.  He does not like amlodipine because it caused ankle  swelling.  In February 2010 he was started on Coreg and methyldopa,  and  discontinued them because he thought they were making him short of  breath.  In retrospect it is almost certainly the heart failure that was  making him short of breath and not the medicines.   HOME MEDICINES:  1. Tekturna 300 mg p.o. daily.  2. Lasix 40 mg p.o. daily.  3. Clonidine 0.3 mg b.i.d.  4. Diovan 320 mg daily.  5. Lovaza 1 g b.i.d.  6. Potassium chloride 20 mEq p.o. daily.   REVIEW OF SYSTEMS:  Positive for headaches, dyspnea on exertion,  orthopnea, PND, lower extremity edema, coughing, wheezing and fatigue.   PHYSICAL EXAM:  VITAL SIGNS:  Temperature 97.6, pulse 90, respiratory  rate 18, blood pressure initially 185/154, presently 160/130, oxygen  saturation 100% on 3 liters.  This is after 1.3 liters of urine output.  GENERAL:  This is a very fit looking, healthy-appearing, African  American man in no acute distress.  HEENT:  Pupils are round and reactive.  Sclerae are clear.  Mucous  membranes are moist.  NECK:  Neck is supple.  While sitting upright the neck veins are flat.  There is positive hepatojugular reflux.  No carotid bruits.  CARDIOVASCULAR:  Hyperdynamic  precordium, normal rate, regular rhythm.  No murmurs.  Upon palpation there appears to be a summation gallop,  however, this is not audible.  2+ dorsalis pedis and radial pulses  bilaterally.  PULMONARY:  Exam reveals crackles in both bases approximately a quarter  of the way up.  ABDOMEN:  Soft, nontender, nondistended.  EXTREMITIES:  1+ pitting edema bilaterally up to the mid shin.  NEUROLOGIC:  Visual acuity is intact.  Awake, alert, oriented x3.  Cranial nerves are symmetric and intact, 5/5 strength in all four  extremities.  SKIN:  Exam reveals no rash or lesions.   DIAGNOSTIC TESTS:  Chest x-ray shows increasing interstitial and patchy  air space opacities bilaterally consistent with edema versus infection.  The clinical scenario clearly favors edema.  EKG reveals sinus rhythm 89  beats per minute with LVH.   LABS:  White blood cells 6.2, hemoglobin 12.2, platelets 202.  Sodium  135, potassium 3.5, BUN 26, creatinine 2.3, CK 234, CK-MB 5.6, troponin  0.09.   IMPRESSION:  This is a 55 year old African American man with malignant  hypertension with end-organ damage.  Most of his problems are most  likely acute on chronic in nature, including renal insufficiency and  congestive heart failure.   PLAN:  1. Admit to the ICU at [facility] to Dr. Loralie Champagne.  2. We will discontinue nitroglycerin drip and will initiate IV      labetalol drip.  Will start at 1 mg per minute.  Our goal will be      to drop his diastolic to less than [id number] over the course of the day.      Will continue Diovan, Tekturna and clonidine.  Eventually he is      probably going to require dihydropyridine calcium channel blocker.      He did not like amlodipine because of lower extremity edema, so we      could try a different agent paired with Lasix and he might tolerate      it better.  3. Continue diuresis with IV Lasix 40 mg b.i.d.  Strict in's and      out's, sodium restriction, fluid restriction.  4.  Check renal artery duplex scan and TSH.  5. Check transthoracic echocardiogram to evaluate left ventricular  size and function, etc.  6. I discussed the case with the on-call nephrologist, Dr. Jonnie Finner,      who will see the patient in consultation.  The urinalysis appears      bland although there is some proteinuria.  We will check urine      protein/creatinine ratio.  7. Subcutaneous heparin for deep venous thrombosis prophylaxis.      Aurea Graff, MD  Electronically Signed     NDL/MEDQ  D:  12/26/2008  T:  12/26/2008  Job:  [id number]

## 2010-10-31 NOTE — Discharge Summary (Signed)
[name], [name]                ACCOUNT NO.:  [id number]   MEDICAL RECORD NO.:  [id number]          PATIENT TYPE:  INP   LOCATION:  69                         FACILITY:  [facility]   PHYSICIAN:  Heinz Knuckles. Norins, MD  DATE OF BIRTH:  12/19/1955   DATE OF ADMISSION:  02/10/2009  DATE OF DISCHARGE:  02/11/2009                               DISCHARGE SUMMARY   ADMISSION DIAGNOSES:  1. Hypertensive urgency.  2. Chronic kidney disease with worsening creatinine.  3. Anemia.   DISCHARGE DIAGNOSES:  1. Hypertensive urgency.  2. Chronic kidney disease with worsening creatinine.  3. Anemia.   CONSULTATIONS:  None.   PROCEDURES:  MRA of renal arteries, final report pending.  My  interpretation is no obvious sign of renal artery stenosis.   HISTORY OF PRESENT ILLNESS:  Patient is a 55 year old African American  gentleman with a history of significant hypertension, chronic kidney  disease and congestive heart failure.  He had been in the hospital in  June.  The patient has been seen by Dr. Jimmy Footman for followup of his  chronic renal insufficiency, sees Dr. Gwendolyn Grant for Internal  Medicine and has been scheduled to see Dr. Jenkins Rouge.   The patient reports that approximately 1 week prior to admission, he was  having recurrent headaches and started taking Midrin.  He does keep good  records of his blood pressure at home and did note that he started to  have a rise in blood pressure.  He denied any dizziness, headache, chest  pain, palpitations, shortness of breath, nausea, vomiting, diarrhea or  lower extremity edema.  Because of his rising blood pressure, he came to  [facility].  In the ER, he was found have a normal  EKG with J-point elevation in V2, T-wave inversion in V5-6 and he was  admitted because of hypertensive urgency.   PAST MEDICAL HISTORY:  Well-documented in the H and P as are admitting  medications.   HOSPITAL COURSE:  The patient was  started on a nitroglycerin drip.  He  was restarted on all of his home medications.  The patient did have  laboratory followup which showed that his creatinine, which had been  2.66, July 15th, rose to 3.5 at time of admission.  Followup lab on the  27th showed that his creatinine was 2.87, coming back down to his  baseline.  The patient did have cardiac panels and markers starting on  the day of admission where his initial CK-MB was 3.3 and troponin-I was  less than 0.05.  A full cardiac panel with a creatinine of 398, then to  357, then to 355 with troponin of 0.04, then to 0.03, then to 0.03.  EKG  remained unremarkable.  Telemetry was normal.  Because of acute  hypertensive changes,  MRA was performed to rule out renal artery  stenosis with interpretation as above.  The patient's blood pressures  have started to trend down and being 127/83 at time of this dictation,  down from 140/95, down from 149/98, down from 145/99.  The patient is  feeling well.  He has been ambulating the halls.  He has had no chest  pain or chest discomfort.  With the patient's blood pressure being under  better control, with his cardiac enzymes trending down to normal, with  his creatinine improving, being free of symptoms and with the most  likely etiology for his hypertensive emergency being the use of Midrin,  which does have as listed, a most serious side effect of severe  hypertension, it is felt he is able to be discharged to home.  The  patient will be instructed to keep his standing appointment with Dr. Clair Gulling  Deterding later in September and an upcoming appointment with Dr. Jenkins Rouge.   DISCHARGE PHYSICAL EXAMINATION:  VITAL SIGNS:  Temperature 97.9, blood  pressure 127/83.  GENERAL APPEARANCE:  This is a tall, well-groomed, athletic-appearing  African American gentleman in no acute distress.  CARDIOVASCULAR:  Unremarkable.  No further exam conducted.   DISPOSITION:  The patient is discharged  home.   DISCHARGE MEDICATIONS:  He will resume all of his home medications  including:  1. Tekturna 300 mg daily.  2. Clonidine 0.3 mg b.i.d.  3. Hydralazine 50 mg b.i.d.  4. Labetalol 400 mg b.i.d.  5. Micardis 80 mg daily.  6. Aspirin 81 mg daily.  7. Potassium 20 mEq daily.  8. Lasix 40 mg 1-1/2 tablets daily.  9. Imdur 30 mg 1/2 tablet daily.  10.Lovaza 1 gram caps 2 times a day.   The patient will be discharged to home.  He will keep his followup  appointments as already scheduled.      Heinz Knuckles Norins, MD  Electronically Signed     MEN/MEDQ  D:  02/11/2009  T:  02/11/2009  Job:  [id number]   cc:   Jeneen Rinks L. Deterding, M.D.  Fax: [phone]

## 2010-10-31 NOTE — Discharge Summary (Signed)
[name], [name]                ACCOUNT NO.:  [id number]   MEDICAL RECORD NO.:  [id number]          PATIENT TYPE:  INP   LOCATION:  [place]                         [facility]   PHYSICIAN:  Alex Bamberg. Alex Cancel, MD, FACCDATE OF BIRTH:  04-01-1956   DATE OF ADMISSION:  12/26/2008  DATE OF DISCHARGE:  12/30/2008                               DISCHARGE SUMMARY   PRIMARY CARE PHYSICIAN:  Alex Millin R. Karlton Lemon, MD   PRIMARY CARDIOLOGIST:  Alex Bamberg. Alex Cancel, MD, [facility]   PRIMARY NEPHROLOGIST:  [name]   DISCHARGING DIAGNOSES:  1. Congestive heart failure felt to be secondary to nonischemic      cardiomyopathy, status post echocardiogram this admission with an      ejection fraction of 30% in the setting of extreme hypertension.  2. Hypertension, poorly controlled, in an [place] American gentleman      requiring multiple medications for optimization, evaluated by Dr.      Jimmy Collins during this admission.  The patient underwent a renal      ultrasound.  Left kidney was slightly hyperechoic compared to      adjacent spleen consistent with medical renal disease.  The right      kidney has parenchyma which is hyperechoic compared to the adjacent      liver consistent with medical renal disease.  No hydronephrosis,      mass, cyst or calculus were evident.  3. Acute renal insufficiency.  Creatinine at time of discharge 2.66.      Patient is to follow up with [name] within the next 2-3      weeks for further evaluation.  4. Anemia felt to be secondary to renal disease.   PROCEDURES:  1. Renal ultrasound.  2. A 2-D echocardiogram.   PAST MEDICAL HISTORY:  Includes hypertension and headaches.   [name] is a 55 year old African American gentleman with history of  hypertension as stated above who was felt to have relatively well-  controlled blood pressures until approximately 2002, at which point he  states his blood pressure became impossible to control.  He states he  has tried  almost every oral agent, several of which he has been unable  to tolerate for various reasons.  For the past 4-5 months, he has had  increased shortness of breath, initially with exertion, but over the  past several months, he has had to stop working out because he gets so  short of breath and develops some orthopnea, PND and significant lower  extremity edema.  He was started on Lasix within the past few weeks and  lost approximately 20 pounds, but despite this, his symptoms progressed.  He has noticed significant orthopnea and PND, and he wakes up gasping  for air.  He has also experienced increased anxiety and panic with this.  He finally decided to come to the emergency room to get evaluated on day  of admission.  In the emergency room, he was afebrile.  Blood pressure  was 185/154.  Eventually, it dropped to 160/130.  His oxygen was 100% on  3 L.  After IV  diuresis, he voided 1.3 L of urine.  He did, however,  have crackles in both bases and 1+ pitting edema bilaterally.  Chest x-  ray showed interstitial and patchy airspace opacities bilaterally  consistent with edema.  EKG showed sinus rhythm at a rate of 89 with  LVH.  Hemoglobin was 12.2, potassium 3.5, troponin of 0.09.  Patient's  creatinine was noted to be 2.3.  Patient was admitted and started on IV  labetalol drip.  We continued his Diovan, Tekturna and clonidine.  We  continued IV Lasix.  We obtained a renal artery ultrasound, checked a  TSH, a 2-D echocardiogram and asked renal to assist in his care.  Patient was seen by [name] on December 27, 2008.  He had presumed  chronic kidney disease, most likely secondary to hypertension, and he  planned to obtain patient's old records from [facility] to confirm  the chronicity.  His pulmonary edema was felt most likely to be related  to his hypertension.  He switched him over to Lasix 80 mg p.o. b.i.d.  and switched him over to labetalol 400 mg p.o. b.i.d. with plans to   follow his labs.  Patient slowly responded to diuretics, and blood  pressure improved.  Renal function was followed closely.  Patient was  seen by [name] on December 30, 2008.  Patient's glomerular filtration  rate was around 30.  We felt that it may get a little bit better over  time, will need to follow up closely outpatient.  We reviewed results of  renal ultrasound.  [name] also saw patient.  Blood pressure was  140/100 which is acceptable in this patient for the time being.  Creatinine was 2.6, potassium 3.9.  Patient is stable to be discharged  home and to follow up with [name] in 4 weeks and [name] in 2-  3 weeks.  At time of discharge, patient has been restricted from work x1  week.  He will follow up with [name] February 09, 2009, at 8:00 a.m.,  and then he will call [name] for his appointment.   MEDICINES AT DISCHARGE:  1. Tekturna 300 mg daily.  2. Clonidine 0.3 mg b.i.d.  3. Lasix 40 mg 1-1/2 tablets b.i.d.  4. Hydralazine 50 mg b.i.d.  5. Imdur 15 mg daily.  6. Labetalol 400 mg b.i.d.  7. Micardis 80 mg daily.  8. Aspirin 81 daily.  9. Potassium 20 mEq daily.  10.__________ 1 gram b.i.d.   Duration of discharge encounter was over 30 minutes.      [name], ACNP      Alex Bamberg. Alex Cancel, MD, [facility]  Electronically Signed    MB/MEDQ  D:  12/30/2008  T:  12/30/2008  Job:  [id number]   cc:   [name], M.D.  [phone]   [name]

## 2010-10-31 NOTE — H&P (Signed)
NAMELAQUINTA, WIDER NO.:  [id number]   MEDICAL RECORD NO.:  [id number]          PATIENT TYPE:  INP   LOCATION:  0102                         FACILITY:  [facility]   PHYSICIAN:  Arlyss Repress, MD        DATE OF BIRTH:  24-May-1956   DATE OF ADMISSION:  02/10/2009  DATE OF DISCHARGE:                              HISTORY & PHYSICAL   CHIEF COMPLAINT:  Hypertension.   HISTORY OF THE PRESENT ILLNESS:  The patient is a 55 year old male with  history of hypertension, chronic kidney disease and congestive heart  failure who complains of taking Midrin one week ago and since then his  blood pressure has been uncontrolled.  The patient denies any dizziness,  headache, chest pain, palpitations, shortness of breath, nausea  vomiting, diarrhea, and lower extremity edema.  The patient denies any  recent change in medications.  EKG shows normal sinus rhythm at 70,  normal PR interval, prolonged QTC at 457, normal axis, LVH, J-point  elevation in V2, and T wave inversion in V5 and V6.  The patient is  being admitted for hypertensive urgency.   PAST MEDICAL HISTORY:  1. Hypertension.  2. Chronic kidney disease.  3. Congestive heart failure.  4. Headache.  5. Secondary hyperparathyroidism.   PAST SURGICAL HISTORY:  Vasectomy.   FAMILY HISTORY:  Mother is alive at age 45 and has hypertension.  His  father is alive at age 74 and has hypertension and congestive heart  failure; he was a smoker.  Siblings; 2 brothers and 1 sister all of whom  have hypertension.   ALLERGIES:  ACE INHIBITOR CAUSES COUGH, CARVEDILOL-CANNOT BREATHE AND  METHYLDOPA-CANNOT BREATHE.   MEDICATIONS:  1. Clonidine 0.3 mg by mouth twice a day.  2. Hydralazine 50 mg by mouth twice a day.  3. Potassium chloride 1 by mouth every day.  4. Imdur 15 mg by mouth every day.  5. Taciturn 300 mg by mouth every day.  6. Micardis 80 mg by mouth every day.  7. Fish oil 1 by mouth every day.  8. Enteric coated aspirin  81 mg by mouth every day.  9. Labetalol 200 mg by mouth twice a day.  10.Furosemide 60 mg by mouth twice a day.  11.Carvedilol.   <Review of Systems>  negative for all 10 organ systems except for pertinent + in above hpi   PHYSICAL EXAMINATION:  VITAL SIGNS:  Temperature; afebrile.  Pulse 75.  Blood pressure 186/120.  Pulse oximetry 100% in room air.  HEENT:  Anicteric. EOMI.  No nystagmus.  Pupils are equal, round and  react to light concentric.  Near reflex is intact.  Mucous membranes  moist.  NECK:  No JVD.  No bruit. No thyromegaly. No adenopathy.  HEART:  Regular rate and rhythm, S1 and S2.  No murmurs, gallops or  rubs.  LUNGS:  The lungs are clear to auscultation bilaterally.  ABDOMEN:  The abdomen is soft, nontender and nondistended with positive  bowel sounds.  EXTREMITIES:  The extremities have no cyanosis, clubbing or edema.  SKIN:  The skin  is with no rashes.  LYMPHATICS:  Lymph nodes are with no adenopathy.  NEUROLOGIC:  On neurological exam cranial nerves II-XII are intact.  Reflexes are 2+, symmetric and diffuse with downgoing toes bilaterally.  Motor strength is 5/5 in all four extremities.  Bilateral pinprick  intact.   LABORATORY DATA:  Urinalysis negative.  CPK/MB 3.32 and troponin I less  than 0.05.  BUN 45, creatinine 2.5, potassium 4.0 and sodium 141.  Hemoglobin 12.2.  Baseline creatinine was 2.38 on December 29, 2008.   ASSESSMENT AND PLAN:  1. Hypertensive urgency.  The patient was started on Tridal.  We will      try to titrate the Tridal upwards, and continue him on labetalol,      furosemide, clonidine, hydralazine, potassium, taciturn, Micardis,      and Imdur.  2. Chronic kidney disease.  (Acute on chronic renal failure.)  We will      check urine sodium, urine creatinine, urine eosinophils and obtain      a renal ultrasound.  We will obtain a nephrology consult in the      morning.  3. Anemia; likely related to chronic kidney disease; however, we  will      check hemoccult stool, and iron, total iron binding capacity,      ferritin, [id number], and folic acid.  The patient has already had serum      protein electrophoresis and __________ , which were negative.  4. DVT prophylaxis with SCDs and TED.      Arlyss Repress, MD  Electronically Signed     JYK/MEDQ  D:  02/10/2009  T:  02/10/2009  Job:  [id number]   cc:   Mateo Flow A. Asa Lente, [place], [place] [address]   [place] Deterding, M.D.  Fax: [phone]   Wallis Bamberg. Johnsie Cancel, [place], [address]  [place] [address]

## 2010-11-07 ENCOUNTER — Other Ambulatory Visit: Payer: Self-pay | Admitting: Internal Medicine

## 2011-01-10 ENCOUNTER — Other Ambulatory Visit: Payer: Self-pay | Admitting: Internal Medicine

## 2011-01-10 ENCOUNTER — Other Ambulatory Visit: Payer: Self-pay | Admitting: Cardiovascular Disease

## 2011-02-08 ENCOUNTER — Other Ambulatory Visit: Payer: Self-pay | Admitting: Internal Medicine

## 2011-02-13 ENCOUNTER — Other Ambulatory Visit: Payer: Self-pay | Admitting: Internal Medicine

## 2011-05-12 ENCOUNTER — Other Ambulatory Visit: Payer: Self-pay | Admitting: Internal Medicine

## 2011-05-16 ENCOUNTER — Other Ambulatory Visit: Payer: Self-pay | Admitting: Internal Medicine

## 2011-06-15 ENCOUNTER — Other Ambulatory Visit: Payer: Self-pay | Admitting: Internal Medicine

## 2011-06-25 ENCOUNTER — Other Ambulatory Visit: Payer: Self-pay | Admitting: Internal Medicine

## 2011-06-25 ENCOUNTER — Ambulatory Visit (INDEPENDENT_AMBULATORY_CARE_PROVIDER_SITE_OTHER): Payer: 59 | Admitting: Internal Medicine

## 2011-06-25 ENCOUNTER — Encounter: Payer: Self-pay | Admitting: Internal Medicine

## 2011-06-25 DIAGNOSIS — I10 Essential (primary) hypertension: Secondary | ICD-10-CM

## 2011-06-25 DIAGNOSIS — F419 Anxiety disorder, unspecified: Secondary | ICD-10-CM

## 2011-06-25 DIAGNOSIS — I5022 Chronic systolic (congestive) heart failure: Secondary | ICD-10-CM

## 2011-06-25 DIAGNOSIS — F411 Generalized anxiety disorder: Secondary | ICD-10-CM

## 2011-06-25 DIAGNOSIS — N183 Chronic kidney disease, stage 3 unspecified: Secondary | ICD-10-CM

## 2011-06-25 MED ORDER — HYDRALAZINE HCL 50 MG PO TABS: 50.0000 mg | ORAL_TABLET | Freq: Four times a day (QID) | ORAL | Status: DC

## 2011-06-25 MED ORDER — SERTRALINE HCL 50 MG PO TABS: 50.0000 mg | ORAL_TABLET | Freq: Every day | ORAL | Status: DC

## 2011-06-25 MED ORDER — SERTRALINE HCL 25 MG PO TABS: 25.0000 mg | ORAL_TABLET | Freq: Every day | ORAL | Status: DC

## 2011-06-25 MED ORDER — LABETALOL HCL 200 MG PO TABS: 400.0000 mg | ORAL_TABLET | Freq: Three times a day (TID) | ORAL | Status: DC

## 2011-06-25 NOTE — Patient Instructions (Signed)
It was good to see you today. Medications reviewed, will increase hydralazine to 4 times a day for blood pressure and start sertraline low-dose once daily for anxiety/nerve symptoms Your prescription(s) and refills have been submitted to your pharmacy. Please take as directed and contact our office if you believe you are having problem(s) with the medication(s). Will refer you to follow up with dr. Johnsie Cancel (cardiology as discussed) and continue to follow with dr. deterding as ongoing Please schedule followup in 3 months for recheck on anxiety and blood pressure, call sooner if problems.

## 2011-06-25 NOTE — Progress Notes (Signed)
Subjective:    Patient ID: [name], male    DOB: 11-26-1955, 56 y.o.   MRN: [id number]  HPI  here for follow up - reviewed chronic medical issues today  HTN - reports compliance with ongoing medical treatment and no changes in medication dose or frequency. denies adverse side effects related to current therapy. occassionally elevated blood pressure but no chest pain, headache or shortness of breath. follows with cards (nishan -overdue for followup) and renal (deterding) for same -  Dyslipidemia - on lovaza, not statin - reports compliance with ongoing medical treatment and no changes in medication dose or frequency. denies adverse side effects related to current therapy.   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 Chronic systolic heart failure     NICM - LVEF 30% echo 12/2008  . ERECTILE DYSFUNCTION, ORGANIC   . ANEMIA   . DYSLIPIDEMIA   . HYPERTENSION   . CHRONIC KIDNEY DISEASE STAGE III (MODERATE)   . Hyperparathyroidism, secondary     Review of Systems  Constitutional: Negative for fever, fatigue and unexpected weight change.  Respiratory: Negative for shortness of breath.   Cardiovascular: Negative for leg swelling.  Neurological: Negative for headaches.  Psychiatric/Behavioral: Negative for behavioral problems, confusion, dysphoric mood and decreased concentration. The patient is nervous/anxious.        Objective:   Physical Exam   BP 170/102  Pulse 77  Temp(Src) 98.6 F (37 C) (Oral)  Wt 221 lb 1.9 oz (100.299 kg)  SpO2 98% Wt Readings from Last 3 Encounters:  06/25/11 221 lb 1.9 oz (100.299 kg)  10/17/10 215 lb 12.8 oz (97.886 kg)  05/05/10 216 lb (97.977 kg)   Constitutional:  He is muscular, but appears  well-developed and well-nourished. No distress.  Neck: Normal range of motion. Neck supple. No JVD present. No thyromegaly present.  Cardiovascular: Normal rate, regular rhythm and normal heart sounds.  No murmur heard. no BLE edema Pulmonary/Chest: Effort normal and breath sounds normal. No respiratory distress. no wheezes.  Neurological: he is alert and oriented to person, place, and time. No cranial nerve deficit. Coordination normal.  Skin: Skin is warm and dry.  No erythema or ulceration.  Psychiatric: he has a slightly anxious mood and affect. behavior is normal. Judgment and thought content normal.   Lab Results  Component Value Date   WBC 9.1 06/08/2010   HGB 12.1 06/08/2010   HCT 35.4 06/08/2010   PLT 203 06/08/2010   GLUCOSE 101 06/08/2010   CHOL 185 01/31/2010   TRIG 155.0* 01/31/2010   HDL 32.10* 01/31/2010   LDLCALC 122* 01/31/2010   ALT 20 06/08/2010   AST 28 06/08/2010   NA 138 06/08/2010   K 4.3 06/08/2010   CL 104 06/08/2010   CREATININE 2.34 06/08/2010   BUN 24 06/08/2010   CO2 26 06/08/2010   TSH 2.631 *Test methodology is 3rd generation TSH** 12/26/2008     Assessment & Plan:  See problem list. 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 Feels this contributes to his uncontrolled hypertension. He would like to begin medication for same. Various options and nonpharmacologic treatment for anxiety reviewed and patient  prefers medications. Low-dose sertraline selected, potential risk benefit of medication treatment reviewed. Patient will call if symptoms worse and followup in 4-6 weeks for review and titration of medication as needed

## 2011-06-25 NOTE — Assessment & Plan Note (Signed)
NICM - (LVEF 30% echo 12/2008 in setting of uncontrolled BP) Euvolemic, symptoms stable - overdue for follow up echo 11/2010 with cards > will refer now The current medical regimen is effective;  continue present plan and medications.

## 2011-06-25 NOTE — Assessment & Plan Note (Signed)
BP Readings from Last 3 Encounters:  06/25/11 170/102  10/17/10 142/90  05/05/10 142/85   for stress and anxiety causing increase in blood pressure readings - denies missed doses Start sertraline as discussed above and increase hydralazine to 4 times daily Continue other medications as ongoing Also follows with cards and renal for same -

## 2011-06-25 NOTE — Assessment & Plan Note (Signed)
Renal OV and labs 11/2010 reviewed today -  Renal fx stable The current medical regimen is effective;  continue present plan and medications.

## 2011-06-28 ENCOUNTER — Institutional Professional Consult (permissible substitution): Payer: 59 | Admitting: Cardiology

## 2011-07-11 ENCOUNTER — Other Ambulatory Visit: Payer: Self-pay | Admitting: Internal Medicine

## 2011-07-16 ENCOUNTER — Telehealth: Payer: Self-pay

## 2011-07-16 NOTE — Telephone Encounter (Signed)
Pr called stating that Sertraline 25 mg has been working, no SE. Pt is requesting to increase to 50 mg as discussed in OV, okay to increase and fill?

## 2011-07-17 MED ORDER — SERTRALINE HCL 50 MG PO TABS: 50.0000 mg | ORAL_TABLET | Freq: Every day | ORAL | Status: DC

## 2011-07-17 NOTE — Telephone Encounter (Signed)
Pt advised of updated Rx/pharmacy

## 2011-07-17 NOTE — Telephone Encounter (Signed)
Yes thanks 

## 2011-07-31 ENCOUNTER — Encounter: Payer: Self-pay | Admitting: Cardiovascular Disease

## 2011-07-31 ENCOUNTER — Institutional Professional Consult (permissible substitution): Payer: 59 | Admitting: Cardiology

## 2011-07-31 ENCOUNTER — Ambulatory Visit (INDEPENDENT_AMBULATORY_CARE_PROVIDER_SITE_OTHER): Payer: 59 | Admitting: Cardiovascular Disease

## 2011-07-31 DIAGNOSIS — N189 Chronic kidney disease, unspecified: Secondary | ICD-10-CM | POA: Insufficient documentation

## 2011-07-31 DIAGNOSIS — I5022 Chronic systolic (congestive) heart failure: Secondary | ICD-10-CM

## 2011-07-31 DIAGNOSIS — Z8639 Personal history of other endocrine, nutritional and metabolic disease: Secondary | ICD-10-CM

## 2011-07-31 DIAGNOSIS — I10 Essential (primary) hypertension: Secondary | ICD-10-CM

## 2011-07-31 DIAGNOSIS — E785 Hyperlipidemia, unspecified: Secondary | ICD-10-CM

## 2011-07-31 DIAGNOSIS — N529 Male erectile dysfunction, unspecified: Secondary | ICD-10-CM

## 2011-07-31 DIAGNOSIS — Z862 Personal history of diseases of the blood and blood-forming organs and certain disorders involving the immune mechanism: Secondary | ICD-10-CM

## 2011-07-31 MED ORDER — ISOSORBIDE MONONITRATE ER 30 MG PO TB24: 30.0000 mg | ORAL_TABLET | Freq: Every day | ORAL | Status: DC

## 2011-07-31 MED ORDER — HYDRALAZINE HCL 50 MG PO TABS: 50.0000 mg | ORAL_TABLET | Freq: Three times a day (TID) | ORAL | Status: DC

## 2011-07-31 NOTE — Assessment & Plan Note (Signed)
EF 30% 2010  F/U echo.  Euvolemic and no recent hospitalizations for CHF

## 2011-07-31 NOTE — Assessment & Plan Note (Signed)
F/U Dr Dederding.  Cr stable around 2.6.  Secondary to poorly controlled HTN in past

## 2011-07-31 NOTE — Assessment & Plan Note (Signed)
Elevated with stress on zoloft.  On 6 meds already.  Low sodium.  Renovascular HTN.  Stable

## 2011-07-31 NOTE — Assessment & Plan Note (Signed)
Cholesterol is at goal.  Continue current dose of statin and diet Rx.  No myalgias or side effects.  F/U  LFT's in 6 months. Lab Results  Component Value Date   LDLCALC 122* 01/31/2010

## 2011-07-31 NOTE — Assessment & Plan Note (Signed)
Per renal.  Low calcium diet

## 2011-07-31 NOTE — Assessment & Plan Note (Signed)
No CAD ok to use Cialis

## 2011-07-31 NOTE — Patient Instructions (Addendum)
Your physician wants you to follow-up in: [place] will receive a reminder letter in the mail two months in advance. If you don't receive a letter, please call our office to schedule the follow-up appointment. Your physician recommends that you continue on your current medications as directed. Please refer to the Current Medication list given to you today. Your physician has requested that you have an echocardiogram. Echocardiography is a painless test that uses sound waves to create images of your heart. It provides your doctor with information about the size and shape of your heart and how well your heart's chambers and valves are working. This procedure takes approximately one hour. There are no restrictions for this procedure. DX CHF

## 2011-07-31 NOTE — Progress Notes (Signed)
[name] is seen today for F/U of DCM, CRF and HTN. He saw Dr. Dedierding and had his lasix increased for LE edema. His Cr is 2.7. In the future he would be a candidate for kidney transplant. He has been hospitalized multiiple times for CHF, EF 25% and hard to contorl BP. On one occasion it was from midrin and another salt indiscretion. He is much more careful about his medication and diet;. He denies PND, orthopnea, recurrent edem, or palpitaoitns. He is still working out and I encouraged him to do less heavy lifting and more aerobic activity. He just returned from visiting his sister in [place]. He is still working for the [employer]  ROS: Denies fever, malais, weight loss, blurry vision, decreased visual acuity, cough, sputum, SOB, hemoptysis, pleuritic pain, palpitaitons, heartburn, abdominal pain, melena, lower extremity edema, claudication, or rash.  All other systems reviewed and negative  General: Affect appropriate Healthy:  appears stated age 56: normal Neck supple with no adenopathy JVP normal no bruits no thyromegaly Lungs clear with no wheezing and good diaphragmatic motion Heart:  S1/S2 no murmur, no rub, gallop or click PMI normal Abdomen: benighn, BS positve, no tenderness, no AAA no bruit.  No HSM or HJR Distal pulses intact with no bruits No edema Neuro non-focal Skin warm and dry No muscular weakness   Current Outpatient Prescriptions  Medication Sig Dispense Refill  . aspirin 81 MG tablet Take 81 mg by mouth daily.        . calcium gluconate 500 MG tablet Take 2 tablets (1,000 mg total) by mouth daily. Alternate 1-2 pills every other day  60 tablet  6  . cloNIDine (CATAPRES) 0.3 MG tablet Take 0.3 mg by mouth 3 (three) times daily.        . furosemide (LASIX) 80 MG tablet Take 80 mg by mouth 2 (two) times daily.        Marland Kitchen ketoconazole (NIZORAL) 2 % cream APPLY A SMALL AMOUNT TO AFFECTED AREA TWICE A DAY UNTIL CLEAR, THEN CONTINUE 1 MORE WEEK  60 g  1  .  KLOR-CON M20 20 MEQ tablet TAKE 1 TABLET BY MOUTH EVERY DAY  30 tablet  5  . labetalol (NORMODYNE) 200 MG tablet TAKE TWO TABLETS BY MOUTH THREE TIMES A DAY (OR AS DIRECTED)  180 tablet  2  . LOVAZA 1 G capsule TAKE 1 CAPSULE TWICE A DAY  60 capsule  5  . MICARDIS 80 MG tablet TAKE 1 TABLET EVERY DAY  30 tablet  5  . sertraline (ZOLOFT) 50 MG tablet Take 1 tablet (50 mg total) by mouth daily.  30 tablet  3  . sildenafil (VIAGRA) 100 MG tablet Take 100 mg by mouth daily as needed.        . TEKTURNA 300 MG tablet TAKE 1 TABLET EVERY DAY  30 tablet  5  . hydrALAZINE (APRESOLINE) 50 MG tablet Take 1 tablet (50 mg total) by mouth 3 (three) times daily.      . isosorbide mononitrate (IMDUR) 30 MG 24 hr tablet Take 1 tablet (30 mg total) by mouth daily.      Marland Kitchen DISCONTD: labetalol (NORMODYNE) 200 MG tablet Take 2 tablets (400 mg total) by mouth 3 (three) times daily.  180 tablet  6    Allergies  Review of patient's allergies indicates no known allergies.  Electrocardiogram:  Assessment and Plan

## 2011-08-09 ENCOUNTER — Other Ambulatory Visit (HOSPITAL_COMMUNITY): Payer: 59

## 2011-08-11 ENCOUNTER — Other Ambulatory Visit: Payer: Self-pay | Admitting: Internal Medicine

## 2011-09-10 ENCOUNTER — Encounter: Payer: Self-pay | Admitting: Cardiovascular Disease

## 2011-10-08 ENCOUNTER — Other Ambulatory Visit: Payer: Self-pay | Admitting: Internal Medicine

## 2011-10-29 ENCOUNTER — Encounter: Payer: Self-pay | Admitting: Cardiovascular Disease

## 2011-11-08 ENCOUNTER — Other Ambulatory Visit: Payer: Self-pay | Admitting: Internal Medicine

## 2011-12-08 ENCOUNTER — Other Ambulatory Visit: Payer: Self-pay | Admitting: Internal Medicine

## 2011-12-11 ENCOUNTER — Other Ambulatory Visit: Payer: Self-pay | Admitting: Internal Medicine

## 2011-12-31 ENCOUNTER — Other Ambulatory Visit: Payer: Self-pay | Admitting: Internal Medicine

## 2012-01-07 ENCOUNTER — Other Ambulatory Visit: Payer: Self-pay | Admitting: Internal Medicine

## 2012-02-07 ENCOUNTER — Other Ambulatory Visit: Payer: Self-pay | Admitting: Internal Medicine

## 2012-03-01 ENCOUNTER — Other Ambulatory Visit: Payer: Self-pay | Admitting: Internal Medicine

## 2012-03-02 ENCOUNTER — Other Ambulatory Visit: Payer: Self-pay | Admitting: Internal Medicine

## 2012-03-03 ENCOUNTER — Other Ambulatory Visit: Payer: Self-pay | Admitting: General Practice

## 2012-03-03 MED ORDER — TELMISARTAN 80 MG PO TABS: 80.0000 mg | ORAL_TABLET | Freq: Every day | ORAL | Status: DC

## 2012-03-03 MED ORDER — ALISKIREN FUMARATE 300 MG PO TABS: 300.0000 mg | ORAL_TABLET | Freq: Once | ORAL | Status: DC

## 2012-03-08 ENCOUNTER — Other Ambulatory Visit: Payer: Self-pay | Admitting: Internal Medicine

## 2012-03-13 ENCOUNTER — Ambulatory Visit (INDEPENDENT_AMBULATORY_CARE_PROVIDER_SITE_OTHER): Payer: 59 | Admitting: Internal Medicine

## 2012-03-13 ENCOUNTER — Other Ambulatory Visit (INDEPENDENT_AMBULATORY_CARE_PROVIDER_SITE_OTHER): Payer: 59

## 2012-03-13 ENCOUNTER — Encounter: Payer: Self-pay | Admitting: Internal Medicine

## 2012-03-13 VITALS — BP 150/100 | HR 57 | Temp 97.5°F | Resp 16 | Wt 208.4 lb

## 2012-03-13 DIAGNOSIS — N189 Chronic kidney disease, unspecified: Secondary | ICD-10-CM

## 2012-03-13 DIAGNOSIS — I5022 Chronic systolic (congestive) heart failure: Secondary | ICD-10-CM

## 2012-03-13 DIAGNOSIS — F411 Generalized anxiety disorder: Secondary | ICD-10-CM

## 2012-03-13 DIAGNOSIS — Z0000 Encounter for general adult medical examination without abnormal findings: Secondary | ICD-10-CM

## 2012-03-13 DIAGNOSIS — I10 Essential (primary) hypertension: Secondary | ICD-10-CM

## 2012-03-13 DIAGNOSIS — E785 Hyperlipidemia, unspecified: Secondary | ICD-10-CM

## 2012-03-13 DIAGNOSIS — F419 Anxiety disorder, unspecified: Secondary | ICD-10-CM

## 2012-03-13 DIAGNOSIS — Z23 Encounter for immunization: Secondary | ICD-10-CM

## 2012-03-13 LAB — HEPATIC FUNCTION PANEL
Albumin: 3.9 g/dL (ref 3.5–5.2)
Alkaline Phosphatase: 57 U/L (ref 39–117)
Bilirubin, Direct: 0.1 mg/dL (ref 0.0–0.3)

## 2012-03-13 LAB — CBC WITH DIFFERENTIAL/PLATELET
Basophils Absolute: 0 10*3/uL (ref 0.0–0.1)
HCT: 37.6 % — ABNORMAL LOW (ref 39.0–52.0)
Hemoglobin: 12.6 g/dL — ABNORMAL LOW (ref 13.0–17.0)
Lymphs Abs: 1.2 10*3/uL (ref 0.7–4.0)
MCHC: 33.5 g/dL (ref 30.0–36.0)
Monocytes Relative: 6.6 % (ref 3.0–12.0)
Neutro Abs: 3.8 10*3/uL (ref 1.4–7.7)
RDW: 13.1 % (ref 11.5–14.6)

## 2012-03-13 LAB — RENAL FUNCTION PANEL
Albumin: 3.9 g/dL (ref 3.5–5.2)
CO2: 28 mEq/L (ref 19–32)
Calcium: 9.7 mg/dL (ref 8.4–10.5)
Chloride: 103 mEq/L (ref 96–112)
Glucose, Bld: 98 mg/dL (ref 70–99)
Potassium: 4.2 mEq/L (ref 3.5–5.1)

## 2012-03-13 LAB — URINALYSIS, ROUTINE W REFLEX MICROSCOPIC
Bilirubin Urine: NEGATIVE
Ketones, ur: NEGATIVE
Leukocytes, UA: NEGATIVE
Urine Glucose: NEGATIVE
pH: 6.5 (ref 5.0–8.0)

## 2012-03-13 LAB — LIPID PANEL
LDL Cholesterol: 127 mg/dL — ABNORMAL HIGH (ref 0–99)
Total CHOL/HDL Ratio: 5

## 2012-03-13 MED ORDER — LABETALOL HCL 200 MG PO TABS: 200.0000 mg | ORAL_TABLET | Freq: Three times a day (TID) | ORAL | Status: DC

## 2012-03-13 MED ORDER — ALISKIREN FUMARATE 300 MG PO TABS: 300.0000 mg | ORAL_TABLET | Freq: Every day | ORAL | Status: DC

## 2012-03-13 MED ORDER — SERTRALINE HCL 50 MG PO TABS: 50.0000 mg | ORAL_TABLET | Freq: Every day | ORAL | Status: DC

## 2012-03-13 MED ORDER — TELMISARTAN 80 MG PO TABS: 80.0000 mg | ORAL_TABLET | Freq: Every day | ORAL | Status: DC

## 2012-03-13 MED ORDER — POTASSIUM CHLORIDE CRYS ER 20 MEQ PO TBCR: 20.0000 meq | EXTENDED_RELEASE_TABLET | Freq: Every day | ORAL | Status: DC

## 2012-03-13 NOTE — Assessment & Plan Note (Signed)
On lovaza - The current medical regimen is effective;  continue present plan and medications.

## 2012-03-13 NOTE — Patient Instructions (Signed)
It was good to see you today. Health Maintenance reviewed - flu shot done today - all other recommended immunizations and age-appropriate screenings are up-to-date. Test(s) ordered today. Your results will be released to [place] (or called to you) after review, usually within 72hours after test completion. If any changes need to be made, you will be notified at that same time. Medications reviewed and updated, no changes at this time. Refill on medication(s) as discussed today. we'll make referral for heart ultrasound (echo). Our office will contact you regarding appointment(s) once made. continue to follow with dr. deterding as ongoing Please schedule followup in 6 months for recheck on anxiety and blood pressure, call sooner if problems.  Health Maintenance, Males A healthy lifestyle and preventative care can promote health and wellness.  Maintain regular health, dental, and eye exams.   Eat a healthy diet. Foods like vegetables, fruits, whole grains, low-fat dairy products, and lean protein foods contain the nutrients you need without too many calories. Decrease your intake of foods high in solid fats, added sugars, and salt. Get information about a proper diet from your caregiver, if necessary.   Regular physical exercise is one of the most important things you can do for your health. Most adults should get at least 150 minutes of moderate-intensity exercise (any activity that increases your heart rate and causes you to sweat) each week. In addition, most adults need muscle-strengthening exercises on 2 or more days a week.     Maintain a healthy weight. The body mass index (BMI) is a screening tool to identify possible weight problems. It provides an estimate of body fat based on height and weight. Your caregiver can help determine your BMI, and can help you achieve or maintain a healthy weight. For adults 20 years and older:   A BMI below 18.5 is considered underweight.   A BMI of 18.5 to  24.9 is normal.   A BMI of 25 to 29.9 is considered overweight.   A BMI of 30 and above is considered obese.   Maintain normal blood lipids and cholesterol by exercising and minimizing your intake of saturated fat. Eat a balanced diet with plenty of fruits and vegetables. Blood tests for lipids and cholesterol should begin at age 79 and be repeated every 5 years. If your lipid or cholesterol levels are high, you are over 50, or you are a high risk for heart disease, you may need your cholesterol levels checked more frequently. Ongoing high lipid and cholesterol levels should be treated with medicines, if diet and exercise are not effective.   If you smoke, find out from your caregiver how to quit. If you do not use tobacco, do not start.   If you choose to drink alcohol, do not exceed 2 drinks per day. One drink is considered to be 12 ounces (355 mL) of beer, 5 ounces (148 mL) of wine, or 1.5 ounces (44 mL) of liquor.   Avoid use of street drugs. Do not share needles with anyone. Ask for help if you need support or instructions about stopping the use of drugs.   High blood pressure causes heart disease and increases the risk of stroke. Blood pressure should be checked at least every 1 to 2 years. Ongoing high blood pressure should be treated with medicines if weight loss and exercise are not effective.   If you are 89 to 54 years old, ask your caregiver if you should take aspirin to prevent heart disease.   Diabetes screening  involves taking a blood sample to check your fasting blood sugar level. This should be done once every 3 years, after age 4, if you are within normal weight and without risk factors for diabetes. Testing should be considered at a younger age or be carried out more frequently if you are overweight and have at least 1 risk factor for diabetes.   Colorectal cancer can be detected and often prevented. Most routine colorectal cancer screening begins at the age of 42 and  continues through age 4. However, your caregiver may recommend screening at an earlier age if you have risk factors for colon cancer. On a yearly basis, your caregiver may provide home test kits to check for hidden blood in the stool. Use of a small camera at the end of a tube, to directly examine the colon (sigmoidoscopy or colonoscopy), can detect the earliest forms of colorectal cancer. Talk to your caregiver about this at age 57, when routine screening begins. Direct examination of the colon should be repeated every 5 to 10 years through age 73, unless early forms of pre-cancerous polyps or small growths are found.   Hepatitis C blood testing is recommended for all people born from [birth year] through 1963 and any individual with known risks for hepatitis C.   Healthy men should no longer receive prostate-specific antigen (PSA) blood tests as part of routine cancer screening. Consult with your caregiver about prostate cancer screening.   Testicular cancer screening is not recommended for adolescents or adult males who have no symptoms. Screening includes self-exam, caregiver exam, and other screening tests. Consult with your caregiver about any symptoms you have or any concerns you have about testicular cancer.   Practice safe sex. Use condoms and avoid high-risk sexual practices to reduce the spread of sexually transmitted infections (STIs).   Use sunscreen with a sun protection factor (SPF) of 30 or greater. Apply sunscreen liberally and repeatedly throughout the day. You should seek shade when your shadow is shorter than you. Protect yourself by wearing long sleeves, pants, a wide-brimmed hat, and sunglasses year round, whenever you are outdoors.   Notify your caregiver of new moles or changes in moles, especially if there is a change in shape or color. Also notify your caregiver if a mole is larger than the size of a pencil eraser.   A one-time screening for abdominal aortic aneurysm (AAA) and  surgical repair of large AAAs by sound wave imaging (ultrasonography) is recommended for ages 63 to 75 years who are current or former smokers.   Stay current with your immunizations.  Document Released: 12/01/2007 Document Revised: 05/24/2011 Document Reviewed: 10/30/2010 [facility] Patient Information 2012 [place].

## 2012-03-13 NOTE — Assessment & Plan Note (Signed)
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  He feels this contributes to his uncontrolled hypertension. began SSRI for same 07/2011  - feeling well The current medical regimen is effective;  continue present plan and medications.  Support offered - no SI/HI

## 2012-03-13 NOTE — Assessment & Plan Note (Signed)
BP Readings from Last 3 Encounters:  03/13/12 150/100  07/31/11 156/100  06/25/11 170/102  labile and hard to control overall - recheck today 140/84 after rest Also follows with cards and renal for same -  The current medical regimen is effective;  continue present plan and medications.

## 2012-03-13 NOTE — Assessment & Plan Note (Signed)
NICM - (LVEF 30% echo 12/2008 in setting of uncontrolled BP) Euvolemic, symptoms stable - mised follow up echo 11/2010 with cards - will reschedule now The current medical regimen is effective;  continue present plan and medications.

## 2012-03-13 NOTE — Progress Notes (Signed)
Subjective:    Patient ID: [name], male    DOB: [birth year], 56 y.o.   MRN: [id number]  HPI  patient is here today for annual physical. Patient feels well and has no complaints.  Also reviewed chronic medical issues today  Hypertension, renovascular and hard to control- reports compliance with ongoing medical treatment and no changes in medication dose or frequency. denies adverse side effects related to current therapy. occassionally elevated blood pressure but no chest pain, headache or shortness of breath. follows with cards (nishan) and renal (deterding) for same -  Dyslipidemia - on lovaza, not statin - reports compliance with ongoing medical treatment and no changes in medication dose or frequency. denies adverse side effects related to current therapy.   CKD 3-4 -renal insuff - follows with renal (deterding) for same - Cr stablizing 2.8 range - also now on calcium for secondary hyperparathyroidism - no edema, no skin changes  ED - ok'd for viagra by cards as CAD - no chest pain or syncope with use of med  Past Medical History  Diagnosis Date  . Chronic systolic heart failure     NICM - LVEF 30% echo 12/2008  . ERECTILE DYSFUNCTION, ORGANIC   . ANEMIA   . DYSLIPIDEMIA   . HYPERTENSION   . CHRONIC KIDNEY DISEASE STAGE III (MODERATE)   . Hyperparathyroidism, secondary   . Anxiety     Review of Systems  Constitutional: Negative for fever, fatigue and unexpected weight change.  Respiratory: Negative for shortness of breath.   Cardiovascular: Negative for leg swelling.  Neurological: Negative for headaches.  Psychiatric/Behavioral: Negative for behavioral problems, confusion, dysphoric mood and decreased concentration. The patient is nervous/anxious.        Objective:   Physical Exam    BP 150/100  Pulse 57  Temp 97.5 F (36.4 C) (Oral)  Resp 16  Wt 208 lb 6 oz (94.518 kg)  SpO2 97% Wt Readings from Last 3 Encounters:  03/13/12 208 lb 6 oz (94.518 kg)    07/31/11 215 lb (97.523 kg)  06/25/11 221 lb 1.9 oz (100.299 kg)   Constitutional: he is muscular, appears well-developed and well-nourished. No distress.  HENT: Head: Normocephalic and atraumatic. Ears: B TMs ok, no erythema or effusion; Nose: Nose normal. Mouth/Throat: Oropharynx is clear and moist. No oropharyngeal exudate.  Eyes: Conjunctivae and EOM are normal. Pupils are equal, round, and reactive to light. No scleral icterus.  Neck: Normal range of motion. Neck supple. No JVD present. No thyromegaly present.  Cardiovascular: Normal rate, regular rhythm and normal heart sounds.  No murmur heard. No BLE edema. Pulmonary/Chest: Effort normal and breath sounds normal. No respiratory distress. he has no wheezes.  Abdominal: Soft. Bowel sounds are normal. She exhibits no distension. There is no tenderness. no masses Musculoskeletal: Normal range of motion, no joint effusions. No gross deformities Neurological: he is alert and oriented to person, place, and time. No cranial nerve deficit. Coordination, balance, speech and gait are normal.  Skin: Skin is warm and dry. No rash noted. No erythema.  Psychiatric: he has a normal mood and affect. Her behavior is normal. Judgment and thought content normal.     Lab Results  Component Value Date   WBC 9.1 06/08/2010   HGB 12.1 06/08/2010   HCT 35.4 06/08/2010   PLT 203 06/08/2010   GLUCOSE 101 06/08/2010   CHOL 185 01/31/2010   TRIG 155.0* 01/31/2010   HDL 32.10* 01/31/2010   LDLCALC 122* 01/31/2010   ALT 20 06/08/2010  AST 28 06/08/2010   NA 138 06/08/2010   K 4.3 06/08/2010   CL 104 06/08/2010   CREATININE 2.34 06/08/2010   BUN 24 06/08/2010   CO2 26 06/08/2010   TSH 2.631 *Test methodology is 3rd generation TSH* 12/26/2008     Assessment & Plan:  CPX/v70.0 - Patient has been counseled on age-appropriate routine health concerns for screening and prevention. These are reviewed and up-to-date. Immunizations are up-to-date or declined.  Labs and prior ECG reviewed.  Also see problem list. Medications and labs reviewed today

## 2012-03-13 NOTE — Assessment & Plan Note (Signed)
Renal OV and labs 11/2010 reviewed today -  Renal fx stable, recheck now with CPX labs The current medical regimen is effective;  continue present plan and medications.

## 2012-03-18 ENCOUNTER — Ambulatory Visit (HOSPITAL_COMMUNITY): Payer: 59 | Attending: Cardiology | Admitting: Radiology

## 2012-03-18 DIAGNOSIS — I129 Hypertensive chronic kidney disease with stage 1 through stage 4 chronic kidney disease, or unspecified chronic kidney disease: Secondary | ICD-10-CM | POA: Insufficient documentation

## 2012-03-18 DIAGNOSIS — I379 Nonrheumatic pulmonary valve disorder, unspecified: Secondary | ICD-10-CM | POA: Insufficient documentation

## 2012-03-18 DIAGNOSIS — I080 Rheumatic disorders of both mitral and aortic valves: Secondary | ICD-10-CM | POA: Insufficient documentation

## 2012-03-18 DIAGNOSIS — I509 Heart failure, unspecified: Secondary | ICD-10-CM | POA: Insufficient documentation

## 2012-03-18 DIAGNOSIS — N189 Chronic kidney disease, unspecified: Secondary | ICD-10-CM | POA: Insufficient documentation

## 2012-03-18 DIAGNOSIS — I10 Essential (primary) hypertension: Secondary | ICD-10-CM

## 2012-03-18 DIAGNOSIS — I5022 Chronic systolic (congestive) heart failure: Secondary | ICD-10-CM

## 2012-03-18 DIAGNOSIS — E785 Hyperlipidemia, unspecified: Secondary | ICD-10-CM

## 2012-03-18 DIAGNOSIS — I369 Nonrheumatic tricuspid valve disorder, unspecified: Secondary | ICD-10-CM | POA: Insufficient documentation

## 2012-03-18 NOTE — Progress Notes (Signed)
Echocardiogram performed.  

## 2012-03-21 ENCOUNTER — Other Ambulatory Visit: Payer: Self-pay | Admitting: Internal Medicine

## 2012-03-31 ENCOUNTER — Other Ambulatory Visit: Payer: Self-pay | Admitting: Internal Medicine

## 2012-04-01 ENCOUNTER — Telehealth: Payer: Self-pay | Admitting: Cardiovascular Disease

## 2012-04-01 NOTE — Telephone Encounter (Signed)
New Problem:    I called and spoke with the patient and was told that he felt he was doing well, had an ECHO scheduled through Dr. Katheren Puller office and received a good report from that, and was having his BP medications monitored closely.  Did not see a reason to schedule at this time.  Will call back between the first of the year and his birthday to possibly schedule a follow-up.

## 2012-04-06 ENCOUNTER — Other Ambulatory Visit: Payer: Self-pay | Admitting: Internal Medicine

## 2012-04-21 ENCOUNTER — Telehealth: Payer: Self-pay | Admitting: *Deleted

## 2012-04-21 NOTE — Telephone Encounter (Signed)
Left msg on vm stating insurance no longer covers Tekturna. Requesting md to change to something else...Johny Chess

## 2012-04-21 NOTE — Telephone Encounter (Signed)
There is no substitute for Tekturna -  I will remove this medication from the list, but I advise patient speak with his kidney specialist Dr. Jimmy Footman to see if he has alternate med to suggest -   Thanks for the message

## 2012-04-21 NOTE — Telephone Encounter (Signed)
Patient return call back gave md response...lmb

## 2012-04-21 NOTE — Telephone Encounter (Signed)
Called pt no answer LMOM RTC.../lmb 

## 2012-05-09 ENCOUNTER — Other Ambulatory Visit: Payer: Self-pay | Admitting: Internal Medicine

## 2012-05-13 ENCOUNTER — Telehealth: Payer: Self-pay | Admitting: *Deleted

## 2012-05-13 NOTE — Telephone Encounter (Signed)
R'cd fax from [facility] for [place]. PA approved 05/13/2012-05/13/2013, [facility] informed.

## 2012-05-24 ENCOUNTER — Other Ambulatory Visit: Payer: Self-pay | Admitting: Internal Medicine

## 2012-05-26 ENCOUNTER — Other Ambulatory Visit: Payer: Self-pay | Admitting: Internal Medicine

## 2012-05-26 MED ORDER — AMLODIPINE BESYLATE 5 MG PO TABS: 5.0000 mg | ORAL_TABLET | Freq: Every day | ORAL | Status: DC

## 2012-05-26 NOTE — Telephone Encounter (Signed)
Per Pharmacy, Drug is not covered. Pt requesting  Cheaper alternative.

## 2012-05-26 NOTE — Telephone Encounter (Signed)
As i previously reported, there is no "alternate" to Tekturna - removed from med list now Start amlodipine 5mg  daily - erx done he needs to OV with Deterding (renal specialist) or me to review other options if BP uncontrolled

## 2012-05-28 NOTE — Telephone Encounter (Signed)
Pt informed of MD's advisement and new medication for BP.

## 2012-08-19 LAB — BASIC METABOLIC PANEL
Creatinine: 2 mg/dL — AB (ref 0.6–1.3)
Potassium: 4.1 mmol/L (ref 3.4–5.3)
Sodium: 139 mmol/L (ref 137–147)

## 2012-08-19 LAB — HEPATIC FUNCTION PANEL
ALT: 19 U/L (ref 10–40)
AST: 29 U/L (ref 14–40)

## 2012-09-05 ENCOUNTER — Encounter: Payer: Self-pay | Admitting: Pulmonary Disease

## 2012-09-05 ENCOUNTER — Ambulatory Visit (INDEPENDENT_AMBULATORY_CARE_PROVIDER_SITE_OTHER): Payer: 59 | Admitting: Pulmonary Disease

## 2012-09-05 VITALS — BP 148/102 | HR 70 | Temp 98.3°F | Ht 74.0 in | Wt 200.0 lb

## 2012-09-05 DIAGNOSIS — G4733 Obstructive sleep apnea (adult) (pediatric): Secondary | ICD-10-CM | POA: Insufficient documentation

## 2012-09-05 DIAGNOSIS — G478 Other sleep disorders: Secondary | ICD-10-CM

## 2012-09-05 NOTE — Progress Notes (Signed)
Subjective:    Patient ID: [name], male    DOB: 11/29/1955, 57 y.o.   MRN: [id number]  HPI The patient is a 57 year old male who I've been asked to see for possible obstructive sleep apnea.  The patient has had sleeping issues for a few years, but they are somewhat better since he has gotten on a permanent day shift.  He typically will get 7 hours of sleep or more on his off days, but only 4 1/2 hours on his work days.  He feels that he is rested upon arising less than 50% of the time.  He has been noted to have loud snoring by his bed partners, but is unsure if he has an abnormal breathing pattern during sleep.  He has had issues with sleep talking, and has he been told that his bed partner in the past.  He has very good dreams, but denies any violent behavior or sleep walking.  He has not broken anything in his bedroom, and states that he keeps his gun stored a long distance from his bedroom.  He has awakened during the night in the past, and describes classic sleep paralysis.  He has also had 2 episodes of enuresis.  He has some sleep pressure during the day with periods of inactivity, but does fine as long as he is being stimulated.  He does have sleepiness in the evenings watching television, and can doze off.  He denies any sleepiness with driving.  The patient states his weight is down 20 pounds over the last 2 years, and his Epworth score today is 18.  He does have a family member who has sleep apnea, but he is more overweight.  Sleep Questionnaire What time do you typically go to bed?( Between what hours) 11:00pm 11:00pm at 1359 on 09/05/12 by Horatio Pel, CMA How long does it take you to fall asleep? 20 mintues 20 mintues at 1359 on 09/05/12 by Horatio Pel, CMA How many times during the night do you wake up? 10 10 at 1359 on 09/05/12 by Horatio Pel, CMA What time do you get out of bed to start your day? 1000 1000 at 1359 on 09/05/12 by Horatio Pel, CMA Do  you drive or operate heavy machinery in your occupation? Yes Yes at 1359 on 09/05/12 by Horatio Pel, CMA How much has your weight changed (up or down) over the past two years? (In pounds) 20 lb (9.072 kg) 20 lb (9.072 kg) at 1359 on 09/05/12 by Horatio Pel, CMA Have you ever had a sleep study before? No No at 1359 on 09/05/12 by Horatio Pel, CMA Do you currently use CPAP? No No at 1359 on 09/05/12 by Horatio Pel, CMA Do you wear oxygen at any time? No No at 1359 on 09/05/12 by Horatio Pel, CMA    Review of Systems  Constitutional: Negative for fever and unexpected weight change.  HENT: Negative for ear pain, nosebleeds, congestion, sore throat, rhinorrhea, sneezing, trouble swallowing, dental problem, postnasal drip and sinus pressure.   Eyes: Negative for redness and itching.  Respiratory: Negative for cough, chest tightness, shortness of breath and wheezing.   Cardiovascular: Negative for palpitations and leg swelling.  Gastrointestinal: Negative for nausea and vomiting.  Genitourinary: Negative for dysuria.  Musculoskeletal: Negative for joint swelling.  Skin: Negative for rash.  Neurological: Negative for headaches.  Hematological: Does not bruise/bleed easily.  Psychiatric/Behavioral: Positive for sleep disturbance. Negative for dysphoric mood. The  patient is not nervous/anxious.        Objective:   Physical Exam Constitutional:  Well developed, no acute distress  HENT:  Nares patent without discharge  Oropharynx without exudate, palate and uvula are mildly elongated.   Eyes:  Perrla, eomi, no scleral icterus  Neck:  No JVD, no TMG  Cardiovascular:  Normal rate, regular rhythm, no rubs or gallops.  No murmurs        Intact distal pulses  Pulmonary :  Normal breath sounds, no stridor or respiratory distress   No rales, rhonchi, or wheezing  Abdominal:  Soft, nondistended, bowel sounds present.  No tenderness noted.    Musculoskeletal:  No lower extremity edema noted.  Lymph Nodes:  No cervical lymphadenopathy noted  Skin:  No cyanosis noted  Neurologic:  Appears mildly sleepy, moves all 4 extremities without obvious deficit.         Assessment & Plan:

## 2012-09-05 NOTE — Patient Instructions (Addendum)
Will schedule for a sleep study to evaluate for various sleep disorders. You need to try and get a minimum of 6hrs of sleep a night.  Will arrange followup once the results of the sleep study are available.

## 2012-09-05 NOTE — Assessment & Plan Note (Signed)
The patient has significant sleep disruption that may be multifactorial.  He clearly has chronic sleep deprivation, and it is unclear how much of this is poor sleep hygiene versus sleep disruption from a sleep disorder.  I have explained to him that chronic sleep deprivation can cause a lot of the symptoms that he is describing.  He certainly could have obstructive sleep apnea given some of his symptoms and his poorly controlled blood pressure, however he may also have other sleep disorder such as a parasomnia or a REM behavior disorder.  At this point, he is going to need a formal sleep study for evaluation.  I will arrange followup once the results are available.

## 2012-09-11 ENCOUNTER — Ambulatory Visit (INDEPENDENT_AMBULATORY_CARE_PROVIDER_SITE_OTHER): Payer: 59 | Admitting: Internal Medicine

## 2012-09-11 ENCOUNTER — Encounter: Payer: Self-pay | Admitting: Internal Medicine

## 2012-09-11 VITALS — BP 130/92 | HR 55 | Temp 97.4°F | Wt 211.1 lb

## 2012-09-11 DIAGNOSIS — F411 Generalized anxiety disorder: Secondary | ICD-10-CM

## 2012-09-11 DIAGNOSIS — I10 Essential (primary) hypertension: Secondary | ICD-10-CM

## 2012-09-11 DIAGNOSIS — N184 Chronic kidney disease, stage 4 (severe): Secondary | ICD-10-CM

## 2012-09-11 DIAGNOSIS — F419 Anxiety disorder, unspecified: Secondary | ICD-10-CM

## 2012-09-11 MED ORDER — SERTRALINE HCL 100 MG PO TABS: 100.0000 mg | ORAL_TABLET | Freq: Every day | ORAL | Status: DC

## 2012-09-11 NOTE — Assessment & Plan Note (Signed)
BP Readings from Last 3 Encounters:  09/11/12 130/92  09/05/12 148/102  03/13/12 150/100  labile and resistance control overall -  Also follows with cards and renal for same -  The current medical regimen is effective;  continue present plan and medications.

## 2012-09-11 NOTE — Patient Instructions (Signed)
It was good to see you today. Medications reviewed, will increase sertraline to 100mg  once daily for anxiety/nerve symptoms Your prescription(s) and refills have been submitted to your pharmacy. Please take as directed and contact our office if you believe you are having problem(s) with the medication(s). Continue working with other specialists ([facility], [place], [place]) as ongoing Please schedule followup in 6 months for recheck on anxiety and blood pressure, call sooner if problems.

## 2012-09-11 NOTE — Assessment & Plan Note (Signed)
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  He feels this contributes to his uncontrolled hypertension. began SSRI for same 07/2011  - feeling well overall, but increase in irritability with approaching retirement (spring 2014) Increase dose now - new erx done The current medical regimen is effective;  continue present plan and medications.  Support offered - no SI/HI

## 2012-09-11 NOTE — Progress Notes (Signed)
  Subjective:    Patient ID: [name], male    DOB: 1956-03-18, 57 y.o.   MRN: [id number]  HPI  Here for follow up - reviewed chronic medical issues today  Hypertension, renovascular and hard to control- reports compliance with ongoing medical treatment and no changes in medication dose or frequency. denies adverse side effects related to current therapy. frequently elevated blood pressure but no chest pain, headache or shortness of breath. follows with cards ([facility]) and renal ([facility]) for same -  Dyslipidemia - on lovaza, not statin - reports compliance with ongoing medical treatment and no changes in medication dose or frequency. denies adverse side effects related to current therapy.   CKD 4 - - follows with renal (deterding) for same - Cr stablizing mid 2 range - also on calcium for secondary hyperparathyroidism - no edema, no skin changes  ED - ok'd for viagra by cards as CAD - no chest pain or syncope with use of med  Past Medical History  Diagnosis Date  . Chronic systolic heart failure     NICM - LVEF 30% echo 12/2008  . ERECTILE DYSFUNCTION, ORGANIC   . ANEMIA   . DYSLIPIDEMIA   . HYPERTENSION   . CHRONIC KIDNEY DISEASE STAGE III (MODERATE)   . Hyperparathyroidism, secondary   . Anxiety     Review of Systems  Constitutional: Negative for fever, fatigue and unexpected weight change.  Respiratory: Negative for shortness of breath.   Cardiovascular: Negative for leg swelling.  Neurological: Negative for headaches.  Psychiatric/Behavioral: Negative for behavioral problems, confusion, dysphoric mood and decreased concentration. The patient is nervous/anxious.        Objective:   Physical Exam    BP 130/92  Pulse 55  Temp(Src) 97.4 F (36.3 C) (Oral)  Wt 211 lb 1.9 oz (95.763 kg)  BMI 27.09 kg/m2  SpO2 98% Wt Readings from Last 3 Encounters:  09/11/12 211 lb 1.9 oz (95.763 kg)  09/05/12 200 lb (90.719 kg)  03/13/12 208 lb 6 oz (94.518 kg)   Constitutional:  he is muscular, appears well-developed and well-nourished. No distress. .  Neck: Normal range of motion. Neck supple. No JVD present. No thyromegaly present.  Cardiovascular: Normal rate, regular rhythm and normal heart sounds.  No murmur heard. No BLE edema. Pulmonary/Chest: Effort normal and breath sounds normal. No respiratory distress. he has no wheezes. Neurological: he is alert and oriented to person, place, and time. No cranial nerve deficit. Coordination, balance, speech and gait are normal.  Psychiatric: he has a mildly dysphoric mood and affect. Her behavior is normal. Judgment and thought content normal.   08/19/12 labs from renal [facility]) reviewed  Lab Results  Component Value Date   WBC 5.4 03/13/2012   HGB 12.6* 03/13/2012   HCT 37.6* 03/13/2012   PLT 194.0 03/13/2012   GLUCOSE 98 03/13/2012   CHOL 187 03/13/2012   TRIG 112.0 03/13/2012   HDL 37.50* 03/13/2012   LDLCALC 127* 03/13/2012   ALT 19 08/19/2012   AST 29 08/19/2012   NA 139 08/19/2012   K 4.1 08/19/2012   CL 103 03/13/2012   CREATININE 2.0* 08/19/2012   BUN 26* 08/19/2012   CO2 28 03/13/2012   TSH 1.30 03/13/2012     Assessment & Plan:   see problem list. Medications and labs reviewed today

## 2012-09-11 NOTE — Assessment & Plan Note (Signed)
Renal OV and labs 08/2012 reviewed today -  Renal fx stable Need to continue to work on HTN control - med compliance reviewed The current medical regimen is effective;  continue present plan and medications.

## 2012-09-17 ENCOUNTER — Encounter (HOSPITAL_BASED_OUTPATIENT_CLINIC_OR_DEPARTMENT_OTHER): Payer: 59

## 2012-09-24 ENCOUNTER — Ambulatory Visit (HOSPITAL_BASED_OUTPATIENT_CLINIC_OR_DEPARTMENT_OTHER): Payer: 59 | Attending: Pulmonary Disease | Admitting: Radiology

## 2012-09-24 VITALS — Ht 74.0 in | Wt 200.0 lb

## 2012-09-24 DIAGNOSIS — G4733 Obstructive sleep apnea (adult) (pediatric): Secondary | ICD-10-CM

## 2012-09-24 DIAGNOSIS — G478 Other sleep disorders: Secondary | ICD-10-CM

## 2012-10-07 DIAGNOSIS — G4730 Sleep apnea, unspecified: Secondary | ICD-10-CM

## 2012-10-07 DIAGNOSIS — G4710 Hypersomnia, unspecified: Secondary | ICD-10-CM

## 2012-10-07 NOTE — Procedures (Signed)
[name], ALARIE NO.:  [id number]  MEDICAL RECORD NO.:  [id number]          PATIENT TYPE:  OUT  LOCATION:  SLEEP CENTER                 FACILITY:  [facility]  PHYSICIAN:  Kathee Delton, MD,FCCPDATE OF BIRTH:  04-[birth year]-1958  DATE OF STUDY:  09/24/2012                           NOCTURNAL POLYSOMNOGRAM  REFERRING PHYSICIAN:  Kathee Delton, MD,FCCP  INDICATION FOR STUDY:  Hypersomnia with sleep apnea.  EPWORTH SLEEPINESS SCORE:  16.  MEDICATIONS:  SLEEP ARCHITECTURE:  The patient had a total sleep time of 302 minutes with no slow-wave sleep and only 29 minutes of REM.  Sleep onset latency was normal at 11 minutes and REM onset was delayed at 298 minutes. Sleep efficiency was mildly reduced at 83%.  RESPIRATORY DATA:  The patient was found to have 14 obstructive and central apneas as well as 19 obstructive hypopneas, giving him an apnea- hypopnea index of 7 events per hour.  The events occurred in all body positions, and there was mild snoring noted throughout.  The patient was also found to have 32, respiratory effort-related arousals, giving him a respiratory disturbance index of 13 events per hour.  OXYGEN DATA:  There was O2 desaturation transiently as low as 83%.  CARDIAC DATA:  No clinically significant arrhythmias were noted.  MOVEMENT-PARASOMNIA:  The patient had no significant leg jerks, but was noted to have sleep talking at times during the night.  There was also increased activity noted at times that was most consistent with bruxism.  IMPRESSIONS-RECOMMENDATIONS: 1. Mild obstructive sleep apnea with an AHI of 7 events per hour and a     respiratory disturbance index of 13 events per hour.  There was O2     desaturation as low as 83%.  Treatment for this degree of sleep     apnea can include a trial of modest weight loss, upper airway     surgery, dental appliance, and also CPAP.  Clinical correlation is     suggested. 2. Sleep talking as well  as possible bruxism noted, however, there was     nothing to suggest REM behavior disorder.     Kathee Delton, MD,FCCP Diplomate, [place] Board of Sleep Medicine    [facility]/MEDQ  D:  10/07/2012 08:30:51  T:  10/07/2012 23:36:41  Job:  [id number]

## 2012-10-08 ENCOUNTER — Telehealth: Payer: Self-pay | Admitting: *Deleted

## 2012-10-08 NOTE — Telephone Encounter (Signed)
Left Message with mother to have pt return call.  No working number for the patient.

## 2012-10-09 ENCOUNTER — Encounter: Payer: Self-pay | Admitting: Pulmonary Disease

## 2012-10-09 ENCOUNTER — Ambulatory Visit (INDEPENDENT_AMBULATORY_CARE_PROVIDER_SITE_OTHER): Payer: 59 | Admitting: Pulmonary Disease

## 2012-10-09 VITALS — BP 112/80 | HR 50 | Temp 97.2°F | Ht 74.0 in | Wt 211.4 lb

## 2012-10-09 DIAGNOSIS — G478 Other sleep disorders: Secondary | ICD-10-CM

## 2012-10-09 DIAGNOSIS — G4733 Obstructive sleep apnea (adult) (pediatric): Secondary | ICD-10-CM

## 2012-10-09 DIAGNOSIS — G4759 Other parasomnia: Secondary | ICD-10-CM | POA: Insufficient documentation

## 2012-10-09 NOTE — Assessment & Plan Note (Signed)
The patient was noted to have mild sleep talking during his sleep study as well as occasional bruxism.  This was very mild in nature, and the patient denies this being a significant issue at this time at home.  He has not had any dangerous behaviors, and does not occur on a nightly basis.  I have discussed with him the possibility of treating with Klonopin at bedtime, but both of [place] have decided to give this more time.

## 2012-10-09 NOTE — Progress Notes (Signed)
  Subjective:    Patient ID: [name], male    DOB: [birth year], 57 y.o.   MRN: [id number]  HPI The patient comes in today for followup of his recent sleep study.  He was found to have very mild obstructive sleep apnea, with an AHI of 7 events per hour and mild oxygen desaturation.  He was also noted to have occasional sleep talking, and occasional episodes of bruxism.  I have reviewed the study in detail with him, and answered all of his questions.   Review of Systems  Constitutional: Negative for fever and unexpected weight change.  HENT: Negative for ear pain, nosebleeds, congestion, sore throat, rhinorrhea, sneezing, trouble swallowing, dental problem, postnasal drip and sinus pressure.   Eyes: Negative for redness and itching.  Respiratory: Negative for cough, chest tightness, shortness of breath and wheezing.   Cardiovascular: Negative for palpitations and leg swelling.  Gastrointestinal: Negative for nausea and vomiting.  Genitourinary: Negative for dysuria.  Musculoskeletal: Negative for joint swelling.  Skin: Negative for rash.  Neurological: Negative for headaches.  Hematological: Does not bruise/bleed easily.  Psychiatric/Behavioral: Negative for dysphoric mood. The patient is not nervous/anxious.        Objective:   Physical Exam Thin male in no acute distress Nose without purulence or discharge noted Neck without lymphadenopathy or thyromegaly Lower extremities without edema, no cyanosis Alert, does not appear to be sleepy, moves all 4 extremities.       Assessment & Plan:

## 2012-10-09 NOTE — Assessment & Plan Note (Signed)
The pt has very mild osa by his recent sleep study, but this does not represent a significant CV risk for him.  The question is whether or not it is affecting his QOL.  The pt is retiring today, and is going to take a year off before getting a "regular job".  I would like him to see how he does over the next 4+ weeks of normalizing his sleep schedule before deciding whether to treat his osa aggressively or not.  I discussed with him surgery, dental appliance, and cpap.

## 2012-10-09 NOTE — Patient Instructions (Addendum)
Let's take about 4-6 weeks now that you are retiring and can normalize your schedule, and see if consistent sleep 7hrs a night or more will improve your symptoms.  If you feel snoring, abnormal behaviors at night, daytime sleepiness continue to be a problem, will consider more aggressive treatment. Please call me in 4-6 weeks with an update.

## 2012-10-12 ENCOUNTER — Other Ambulatory Visit: Payer: Self-pay | Admitting: Internal Medicine

## 2012-10-13 NOTE — Telephone Encounter (Signed)
Pt seen 10/09/12 by [facility]

## 2012-10-19 ENCOUNTER — Other Ambulatory Visit: Payer: Self-pay | Admitting: Internal Medicine

## 2012-11-01 ENCOUNTER — Other Ambulatory Visit: Payer: Self-pay | Admitting: Internal Medicine

## 2012-11-11 ENCOUNTER — Other Ambulatory Visit: Payer: Self-pay | Admitting: *Deleted

## 2012-11-11 MED ORDER — TELMISARTAN 80 MG PO TABS: ORAL_TABLET | ORAL | Status: DC

## 2013-03-09 ENCOUNTER — Other Ambulatory Visit (INDEPENDENT_AMBULATORY_CARE_PROVIDER_SITE_OTHER): Payer: 59

## 2013-03-09 ENCOUNTER — Encounter: Payer: Self-pay | Admitting: Internal Medicine

## 2013-03-09 ENCOUNTER — Ambulatory Visit (INDEPENDENT_AMBULATORY_CARE_PROVIDER_SITE_OTHER): Payer: 59 | Admitting: Internal Medicine

## 2013-03-09 VITALS — BP 182/120 | HR 58 | Temp 98.1°F | Wt 213.0 lb

## 2013-03-09 DIAGNOSIS — G479 Sleep disorder, unspecified: Secondary | ICD-10-CM | POA: Insufficient documentation

## 2013-03-09 DIAGNOSIS — E785 Hyperlipidemia, unspecified: Secondary | ICD-10-CM

## 2013-03-09 DIAGNOSIS — Z23 Encounter for immunization: Secondary | ICD-10-CM

## 2013-03-09 DIAGNOSIS — N184 Chronic kidney disease, stage 4 (severe): Secondary | ICD-10-CM

## 2013-03-09 DIAGNOSIS — I10 Essential (primary) hypertension: Secondary | ICD-10-CM

## 2013-03-09 LAB — LDL CHOLESTEROL, DIRECT: Direct LDL: 128.5 mg/dL

## 2013-03-09 LAB — BASIC METABOLIC PANEL
CO2: 27 mEq/L (ref 19–32)
Chloride: 105 mEq/L (ref 96–112)
Creatinine, Ser: 2.4 mg/dL — ABNORMAL HIGH (ref 0.4–1.5)
Glucose, Bld: 95 mg/dL (ref 70–99)
Potassium: 3.6 mEq/L (ref 3.5–5.1)
Sodium: 138 mEq/L (ref 135–145)

## 2013-03-09 LAB — LIPID PANEL: Triglycerides: 125 mg/dL (ref 0.0–149.0)

## 2013-03-09 LAB — TSH: TSH: 1.52 u[IU]/mL (ref 0.35–5.50)

## 2013-03-09 MED ORDER — CLONAZEPAM 0.5 MG PO TABS: 0.5000 mg | ORAL_TABLET | Freq: Every evening | ORAL | Status: DC | PRN

## 2013-03-09 MED ORDER — AMLODIPINE BESYLATE 10 MG PO TABS: 10.0000 mg | ORAL_TABLET | Freq: Every day | ORAL | Status: DC

## 2013-03-09 NOTE — Assessment & Plan Note (Signed)
On lovaza -  Check lipids annually The current medical regimen is effective;  continue present plan and medications.

## 2013-03-09 NOTE — Patient Instructions (Signed)
It was good to see you today. Medications reviewed, will increase amlodipine to 10mg  once daily for blood pressure control and begin clonazepam 1 tablet at night as needed for sleep problems. Your prescription(s) and refills have been submitted to your pharmacy. Please take as directed and contact our office if you believe you are having problem(s) with the medication(s). Test(s) ordered today. Your results will be released to [place] (or called to you) after review, usually within 72hours after test completion. If any changes need to be made, you will be notified at that same time. Continue working with other specialists ([facility], [place], [place]) as ongoing Also make an appointment with the dental medicine specialist to consider a nighttime bite guard to help with writing which may improve sleep Please schedule followup in 6 months for recheck on blood pressure, call sooner if problems.

## 2013-03-09 NOTE — Assessment & Plan Note (Addendum)
Sleep study 08/2012 with mild OSA, bruxims, sleep talking noted.   CPAP not recommended 09/2012 at pulm f/u, but Klonipin recommended - then declined by patient with plans for normalizing sleep patterns before adding medication. Add Clonzepam 0.5mg  po qhs prn for sleep now - new rx done.   Also advised dental follow up for evaluation and rx of of bite guard.

## 2013-03-09 NOTE — Assessment & Plan Note (Addendum)
Due to see renal ([facility]) -  Renal fx stable Need to continue to work on HTN control - med compliance reviewed, Amlodipine increased to 10mg  daily today

## 2013-03-09 NOTE — Assessment & Plan Note (Addendum)
BP Readings from Last 3 Encounters:  03/09/13 182/120  10/09/12 112/80  09/11/12 130/92  labile and resistance control overall -  Also follows with cards and renal for same -  Increase Amlodipine to 10mg  daily.  BMET today.

## 2013-03-09 NOTE — Progress Notes (Signed)
Subjective:    Patient ID: [name], male    DOB: [birth year], 57 y.o.   MRN: [id number]  HPI  Patient here today for follow up.  Chronic medical issues reviewed.  HTN - renovascular and difficult to control.  Pt reports compliance with medications.  Denies headache, chest pain, or shortness of breath. Exercising 5 days per week.  Some orthostasis at night occasionally.  Follows with renal also.    Dyslipidemia - on Lovaza, due for recheck of lipids  CKD Stage 4 - follows with renal ([facility]), recent labs creatinine stable in mid 2's.  On calcium for secondary hyperparathyroidism.   Insomnia - issues with staying asleep; vivid dreams, frequent nocturnal urination.  Naps frequently during the day.  Rarely able to sleep more than 3 hours at a time.  Sleep study March of this year with mild OSA, CPAP not recommended at that time.  Occasional sleep talking and bruxism noted, pt declined Klonipin when seen by pulmonary ([facility]) 09/2012.   Past Medical History  Diagnosis Date  . Chronic systolic heart failure     NICM - LVEF 30% echo 12/2008  . ERECTILE DYSFUNCTION, ORGANIC   . ANEMIA   . DYSLIPIDEMIA   . HYPERTENSION   . CHRONIC KIDNEY DISEASE STAGE III (MODERATE)   . Hyperparathyroidism, secondary   . Anxiety     Review of Systems  Constitutional: Negative for fever, chills, activity change, fatigue and unexpected weight change.  Respiratory: Negative for cough, chest tightness and shortness of breath.   Cardiovascular: Negative for chest pain, palpitations and leg swelling.       Occasional orthostasis  Gastrointestinal: Negative for nausea, vomiting, abdominal pain and constipation.  Skin: Negative for rash.  Neurological: Negative for dizziness, syncope, light-headedness, numbness and headaches.  Psychiatric/Behavioral: Positive for sleep disturbance.       Objective:   Physical Exam  Constitutional: He is oriented to person, place, and time. He appears well-developed and  well-nourished. No distress.  HENT:  Head: Normocephalic.  Mouth/Throat: Oropharynx is clear and moist.  Neck: Normal range of motion. Neck supple. No thyromegaly present.  Cardiovascular: Normal rate, regular rhythm and normal heart sounds.  Exam reveals no gallop.   No murmur heard. No carotid bruit  Pulmonary/Chest: Effort normal and breath sounds normal. No respiratory distress. He has no wheezes. He has no rales.  Abdominal: Soft. Bowel sounds are normal. There is no tenderness.  Lymphadenopathy:    He has no cervical adenopathy.  Neurological: He is alert and oriented to person, place, and time.  Skin: Skin is warm and dry.  Psychiatric: He has a normal mood and affect.     BP Readings from Last 3 Encounters:  03/09/13 182/120  10/09/12 112/80  09/11/12 130/92   Wt Readings from Last 3 Encounters:  03/09/13 213 lb (96.616 kg)  10/09/12 211 lb 6.4 oz (95.89 kg)  09/24/12 200 lb (90.719 kg)   Labs from [facility] 11/2012 reviewed  Lab Results  Component Value Date   WBC 5.4 03/13/2012   HGB 12.6* 03/13/2012   HCT 37.6* 03/13/2012   PLT 194.0 03/13/2012   GLUCOSE 98 03/13/2012   CHOL 187 03/13/2012   TRIG 112.0 03/13/2012   HDL 37.50* 03/13/2012   LDLCALC 127* 03/13/2012   ALT 19 08/19/2012   AST 29 08/19/2012   NA 139 08/19/2012   K 4.1 08/19/2012   CL 103 03/13/2012   CREATININE 2.0* 08/19/2012   BUN 26* 08/19/2012   CO2 28 03/13/2012  TSH 1.30 03/13/2012       Assessment & Plan:   See problem list. Medications and labs reviewed today.

## 2013-03-16 ENCOUNTER — Other Ambulatory Visit: Payer: Self-pay | Admitting: *Deleted

## 2013-03-16 MED ORDER — AMLODIPINE BESYLATE 10 MG PO TABS: 10.0000 mg | ORAL_TABLET | Freq: Every day | ORAL | Status: DC

## 2013-03-16 MED ORDER — SILDENAFIL CITRATE 100 MG PO TABS: 100.0000 mg | ORAL_TABLET | Freq: Every day | ORAL | Status: DC | PRN

## 2013-03-16 MED ORDER — TELMISARTAN 80 MG PO TABS: ORAL_TABLET | ORAL | Status: DC

## 2013-03-16 NOTE — Telephone Encounter (Signed)
Received fax stating md was going to send rx for pt norvasc. Did not received. Per chart [facility]vasc was [facility] to [facility] resend to new pharmacy in [place]...lmb

## 2013-04-14 ENCOUNTER — Other Ambulatory Visit: Payer: Self-pay | Admitting: Internal Medicine

## 2013-05-12 ENCOUNTER — Other Ambulatory Visit: Payer: Self-pay | Admitting: Internal Medicine

## 2013-05-21 ENCOUNTER — Other Ambulatory Visit: Payer: Self-pay | Admitting: Internal Medicine

## 2013-06-04 ENCOUNTER — Other Ambulatory Visit: Payer: Self-pay | Admitting: Internal Medicine

## 2013-07-06 ENCOUNTER — Other Ambulatory Visit: Payer: Self-pay | Admitting: Internal Medicine

## 2013-07-06 NOTE — Telephone Encounter (Signed)
Faxed script back to [facility].../lmb 

## 2013-07-08 ENCOUNTER — Other Ambulatory Visit: Payer: Self-pay | Admitting: Internal Medicine

## 2013-08-06 ENCOUNTER — Other Ambulatory Visit: Payer: Self-pay | Admitting: Internal Medicine

## 2013-08-12 ENCOUNTER — Other Ambulatory Visit: Payer: Self-pay | Admitting: Internal Medicine

## 2013-09-07 ENCOUNTER — Encounter: Payer: Self-pay | Admitting: *Deleted

## 2013-09-07 ENCOUNTER — Encounter: Payer: Self-pay | Admitting: Internal Medicine

## 2013-09-07 ENCOUNTER — Ambulatory Visit (INDEPENDENT_AMBULATORY_CARE_PROVIDER_SITE_OTHER): Payer: 59 | Admitting: Internal Medicine

## 2013-09-07 VITALS — BP 110/80 | HR 66 | Temp 98.0°F | Wt 225.4 lb

## 2013-09-07 DIAGNOSIS — I10 Essential (primary) hypertension: Secondary | ICD-10-CM

## 2013-09-07 DIAGNOSIS — E785 Hyperlipidemia, unspecified: Secondary | ICD-10-CM

## 2013-09-07 DIAGNOSIS — N184 Chronic kidney disease, stage 4 (severe): Secondary | ICD-10-CM

## 2013-09-07 DIAGNOSIS — J309 Allergic rhinitis, unspecified: Secondary | ICD-10-CM

## 2013-09-07 MED ORDER — LORATADINE 10 MG PO TABS: 10.0000 mg | ORAL_TABLET | Freq: Every day | ORAL | Status: DC | PRN

## 2013-09-07 MED ORDER — FLUTICASONE PROPIONATE 50 MCG/ACT NA SUSP: 1.0000 | Freq: Every day | NASAL | Status: DC

## 2013-09-07 MED ORDER — FISH OIL 1200 MG PO CPDR: 1.0000 | DELAYED_RELEASE_CAPSULE | Freq: Two times a day (BID) | ORAL | Status: DC

## 2013-09-07 NOTE — Progress Notes (Signed)
Subjective:    Patient ID: [name], male    DOB: Jun 04, 1956, 58 y.o.   MRN: [id number]  HPI  Patient here today for follow up.  Chronic medical issues reviewed.  HTN - renovascular and difficult to control.  Pt reports compliance with medications.  Denies headache, chest pain, or shortness of breath. Exercising 5 days per week.   Follows with renal also.    Dyslipidemia - previously on Lovaza, stopped same due to cost - on OTC omega 3  CKD Stage 4 - follows with renal ([facility]), recent labs creatinine stable in mid 2's.  On calcium for secondary hyperparathyroidism.    Past Medical History  Diagnosis Date  . Chronic systolic heart failure     NICM - LVEF 30% echo 12/2008  . ERECTILE DYSFUNCTION, ORGANIC   . ANEMIA   . DYSLIPIDEMIA   . HYPERTENSION   . CHRONIC KIDNEY DISEASE STAGE III (MODERATE)   . Hyperparathyroidism, secondary   . Anxiety   . OSA (obstructive sleep apnea)     mild, CPAP not recommended 08/2012    Review of Systems  Constitutional: Negative for fever, chills, activity change, fatigue and unexpected weight change.  Respiratory: Negative for cough, chest tightness and shortness of breath.   Cardiovascular: Negative for chest pain, palpitations and leg swelling.       Occasional orthostasis  Gastrointestinal: Negative for nausea, vomiting, abdominal pain and constipation.  Skin: Negative for rash.  Neurological: Negative for dizziness, syncope, light-headedness, numbness and headaches.  Psychiatric/Behavioral: Positive for sleep disturbance.       Objective:   Physical Exam  Constitutional: He is oriented to person, place, and time. He appears well-developed and well-nourished. No distress.  HENT:  Head: Normocephalic.  Mouth/Throat: Oropharynx is clear and moist.  Neck: Normal range of motion. Neck supple. No thyromegaly present.  Cardiovascular: Normal rate, regular rhythm and normal heart sounds.  Exam reveals no gallop.   No murmur heard. No  carotid bruit  Pulmonary/Chest: Effort normal and breath sounds normal. No respiratory distress. He has no wheezes. He has no rales.  Abdominal: Soft. Bowel sounds are normal. There is no tenderness.  Lymphadenopathy:    He has no cervical adenopathy.  Neurological: He is alert and oriented to person, place, and time.  Skin: Skin is warm and dry.  Psychiatric: He has a normal mood and affect.     BP Readings from Last 3 Encounters:  09/07/13 110/80  03/09/13 182/120  10/09/12 112/80   Wt Readings from Last 3 Encounters:  09/07/13 225 lb 6.4 oz (102.241 kg)  03/09/13 213 lb (96.616 kg)  10/09/12 211 lb 6.4 oz (95.89 kg)    Lab Results  Component Value Date   WBC 5.4 03/13/2012   HGB 12.6* 03/13/2012   HCT 37.6* 03/13/2012   PLT 194.0 03/13/2012   GLUCOSE 95 03/09/2013   CHOL 212* 03/09/2013   TRIG 125.0 03/09/2013   HDL 41.90 03/09/2013   LDLDIRECT 128.5 03/09/2013   LDLCALC 127* 03/13/2012   ALT 19 08/19/2012   AST 29 08/19/2012   NA 138 03/09/2013   K 3.6 03/09/2013   CL 105 03/09/2013   CREATININE 2.4* 03/09/2013   BUN 28* 03/09/2013   CO2 27 03/09/2013   TSH 1.52 03/09/2013       Assessment & Plan:   Problem List Items Addressed This Visit   Allergic rhinitis - Primary     Seasonal symptoms - recommended OTC nasal steroid and antihistamine tx No infection so  no indication for antibiotics    CKD (chronic kidney disease) stage 4, GFR 15-29 ml/min     Follows with renal ([facility]) -  Renal fx stable by report The current medical regimen is effective;  continue present plan and medications.     HYPERTENSION      BP Readings from Last 3 Encounters:  09/07/13 110/80  03/09/13 182/120  10/09/12 112/80  labile and resistance control overall -  Also follows with cards and renal for same -  Increased Amlodipine to 10mg  daily 02/2013.

## 2013-09-07 NOTE — Assessment & Plan Note (Signed)
Seasonal symptoms - recommended OTC nasal steroid and antihistamine tx No infection so no indication for antibiotics

## 2013-09-07 NOTE — Assessment & Plan Note (Signed)
Follows with renal ([facility]) -  Renal fx stable by report The current medical regimen is effective;  continue present plan and medications.

## 2013-09-07 NOTE — Patient Instructions (Signed)
It was good to see you today.  We have reviewed your prior records including labs and tests today  Medications reviewed and updated Use over-the-counter Claritin once daily and Flonase each day for allergy and congestion symptoms as needed  Please schedule followup in 6 months for annual exam, call sooner if problems.

## 2013-09-07 NOTE — Progress Notes (Signed)
Pre visit review using our clinic review tool, if applicable. No additional management support is needed unless otherwise documented below in the visit note. 

## 2013-09-07 NOTE — Assessment & Plan Note (Addendum)
BP Readings from Last 3 Encounters:  09/07/13 110/80  03/09/13 182/120  10/09/12 112/80  labile and resistance control overall -  Also follows with cards and renal for same -  Increased Amlodipine to 10mg  daily 02/2013.

## 2013-09-07 NOTE — Assessment & Plan Note (Signed)
Prev on lovaza - stopped of cost -  Now on OTC fish oil Check lipids annually

## 2013-09-07 NOTE — Progress Notes (Signed)
[name] [id number] 09/07/2013   Chief Complaint  Patient presents with  . Follow-up    6 months- C/o cold symptoms as well    Subjective  HPI Comments: Here today for 62mo follow up. Chronic medical issues and interval medical events discussed.  He has been compliant with all prescribed medications with desired response and without adverse reactions.  Allergic Rhinitis: 1 week history of Mildly productive rhinorrhea (yellow).  Throat is a little sore and coughed a couple times.  Denies HA, fever, frontal/maxillary sinus pain, tooth pain, or known exposure.  No known seasonal allergies, but says that he often has similar symptoms at this time of year during the season change.  He feels like it is resolving because he feels better now.    HTN - At home average has been stable, but he has not been monitoring it regularly.   Renovascular and difficult to control.  Pt reports compliance with medications.  Denies headache, chest pain, or shortness of breath. Exercising 5 days per week.  Some orthostasis occasionally if he stand to quickly.  Follows with renal also.    Dyslipidemia - d/c Lovaza, due to cost.  Taking fish oil now.  CKD Stage 4 - follows with renal ([facility]), Last vist ~2 months ago.  On calcium for secondary hyperparathyroidism.   Insomnia - Takes clonazepam occasionally (<2 per week).  It is working very well to fall asleep, but does feel a little more tired the next day.  Patient was also counseled on age-appropriate routine health concerns for screening and prevention. These are reviewed and up-to-date. Immunizations are up-to-date or declined. Labs  reviewed.     Past Medical History  Diagnosis Date  . Chronic systolic heart failure     NICM - LVEF 30% echo 12/2008  . ERECTILE DYSFUNCTION, ORGANIC   . ANEMIA   . DYSLIPIDEMIA   . HYPERTENSION   . CHRONIC KIDNEY DISEASE STAGE III (MODERATE)   . Hyperparathyroidism, secondary   . Anxiety   . OSA (obstructive  sleep apnea)     mild, CPAP not recommended 08/2012    Past Surgical History  Procedure Laterality Date  . Vasectomy      Family History  Problem Relation Age of Onset  . Hypertension Mother   . Hypertension Father   . COPD Father   . Coronary artery disease Father     History  Substance Use Topics  . Smoking status: Never Smoker   . Smokeless tobacco: Not on file     Comment: Lives alone, divorced x's 3  . Alcohol Use: No    Current Outpatient Prescriptions on File Prior to Visit  Medication Sig Dispense Refill  . amLODipine (NORVASC) 10 MG tablet Take 1 tablet (10 mg total) by mouth daily.  30 tablet  5  . aspirin 81 MG tablet Take 81 mg by mouth daily.        . calcium gluconate 500 MG tablet Take 2 tablets (1,000 mg total) by mouth daily. Alternate 1-2 pills every other day  60 tablet  6  . clonazePAM (KLONOPIN) 0.5 MG tablet TAKE 1 TABLET BY MOUTH AT BEDTIME AS NEEDED ANXIETY  30 tablet  0  . cloNIDine (CATAPRES) 0.3 MG tablet Take 0.3 mg by mouth 3 (three) times daily.        . furosemide (LASIX) 80 MG tablet Take 80 mg by mouth 2 (two) times daily.        . hydrALAZINE (APRESOLINE) 50 MG tablet  TAKE 1 TABLET BY MOUTH FOUR TIMES DAILY  120 tablet  11  . labetalol (NORMODYNE) 200 MG tablet TAKE 2 TABLETS BY MOUTH THREE TIMES DAILY  720 tablet  0  . potassium chloride SA (KLOR-CON M20) 20 MEQ tablet Take 1 tablet (20 mEq total) by mouth daily.  30 tablet  11  . sertraline (ZOLOFT) 100 MG tablet Take 1 tablet (100 mg total) by mouth daily.  30 tablet  11  . telmisartan (MICARDIS) 80 MG tablet TAKE 1 TABLET BY MOUTH EVERY DAY  30 tablet  5  . VIAGRA 100 MG tablet TAKE 1 TABLET BY MOUTH DAILY AS NEEDED  10 tablet  0   No current facility-administered medications on file prior to visit.    Allergies: No Known Allergies  Review of Systems  Constitutional: Negative for fever, chills, weight loss, malaise/fatigue and diaphoresis.  HENT: Negative for ear pain.   Eyes:  Negative for blurred vision and double vision.  Respiratory: Negative for shortness of breath and wheezing.   Cardiovascular: Negative for chest pain and palpitations.  Gastrointestinal: Negative for nausea, vomiting, diarrhea, constipation and blood in stool.  Musculoskeletal: Negative for joint pain and myalgias.  Neurological: Negative for dizziness and headaches.       Objective  Filed Vitals:   09/07/13 1028 09/07/13 1114  BP:  110/80  Pulse: 66   Temp: 98 F (36.7 C)   TempSrc: Oral   Weight: 225 lb 6.4 oz (102.241 kg)   SpO2: 96%     Physical Exam  Constitutional: He is oriented to person, place, and time. He appears well-developed and well-nourished. No distress.  Nontoxic  HENT:  Head: Normocephalic and atraumatic.  Turbinates moderately swollen and injected b/l.  Clear exudate.  No blood.  Throat non erythematous without swelling or exudate.  Eyes: Pupils are equal, round, and reactive to light. Right eye exhibits no discharge. Left eye exhibits no discharge.  Neck: Normal range of motion. Neck supple. No JVD present. No thyromegaly present.  Cardiovascular: Normal rate and regular rhythm.  Exam reveals no gallop and no friction rub.   No murmur heard. No edema b/l   Respiratory: Effort normal and breath sounds normal. No stridor. No respiratory distress. He has no wheezes. He has no rales.  GI: Soft. Bowel sounds are normal. He exhibits no distension. There is no tenderness. There is no rebound and no guarding.  Genitourinary:  Deferred  Musculoskeletal: Normal range of motion. He exhibits no edema and no tenderness.  Lymphadenopathy:    He has no cervical adenopathy.  Neurological: He is alert and oriented to person, place, and time.  Skin: He is not diaphoretic.  Psychiatric: He has a normal mood and affect. His behavior is normal. Judgment and thought content normal.    BP Readings from Last 3 Encounters:  09/07/13 110/80  03/09/13 182/120  10/09/12  112/80    Wt Readings from Last 3 Encounters:  09/07/13 225 lb 6.4 oz (102.241 kg)  03/09/13 213 lb (96.616 kg)  10/09/12 211 lb 6.4 oz (95.89 kg)    Lab Results  Component Value Date   WBC 5.4 03/13/2012   HGB 12.6* 03/13/2012   HCT 37.6* 03/13/2012   PLT 194.0 03/13/2012   GLUCOSE 95 03/09/2013   CHOL 212* 03/09/2013   TRIG 125.0 03/09/2013   HDL 41.90 03/09/2013   LDLDIRECT 128.5 03/09/2013   LDLCALC 127* 03/13/2012   ALT 19 08/19/2012   AST 29 08/19/2012   NA 138 03/09/2013  K 3.6 03/09/2013   CL 105 03/09/2013   CREATININE 2.4* 03/09/2013   BUN 28* 03/09/2013   CO2 27 03/09/2013   TSH 1.52 03/09/2013    No results found.   Assessment and Plan Allergic Rhinitis: Discussed manually cleansing nasal passages daily when showing and OTC use of nasal corticosteroid.  Take OTC Claritin as needed.   HTN - BP 110/80 today, but is historically variable.  Continue with prescribed medications and contact if home measurements regularly exceed 140/90 or if he has associated symptoms.  Dyslipidemia - Continue taking fish oil in place of lovaza (d/c'd due to cost).  CKD Stage 4 - Continue to follow with renal ([facility])  Insomnia - Takes clonazepam occasionally (<2 per week) with desired effect.  Also practice good sleep hygiene as discussed.  Patient was instructed to notify the office if any new symptoms emerged or the current symptoms become worse.   Return in about 6 months (around 03/10/2014) for annual exam and labs.  Wynelle Cleveland    I have personally reviewed this case with PA student. I also personally examined this patient. I agree with history and findings as documented above. I reviewed, discussed and approve of the assessment and plan as listed above. Gwendolyn Grant, MD

## 2013-09-08 ENCOUNTER — Telehealth: Payer: Self-pay | Admitting: Internal Medicine

## 2013-09-08 NOTE — Telephone Encounter (Signed)
Relevant patient education assigned to patient using Emmi. ° °

## 2013-09-30 ENCOUNTER — Other Ambulatory Visit: Payer: Self-pay | Admitting: Internal Medicine

## 2013-10-07 ENCOUNTER — Other Ambulatory Visit: Payer: Self-pay | Admitting: Internal Medicine

## 2013-11-06 ENCOUNTER — Telehealth: Payer: Self-pay | Admitting: *Deleted

## 2013-11-06 MED ORDER — CLONAZEPAM 0.5 MG PO TABS: 0.5000 mg | ORAL_TABLET | Freq: Every evening | ORAL | Status: DC | PRN

## 2013-11-06 MED ORDER — SERTRALINE HCL 100 MG PO TABS: 100.0000 mg | ORAL_TABLET | Freq: Every day | ORAL | Status: DC

## 2013-11-06 NOTE — Telephone Encounter (Signed)
Trish from pharmacy called requesting refills on Chlonazepam and Sertraline.  States they never received the fax on [phone] for the Sertraline.  Fax number is [phone]. Please advise

## 2013-11-06 NOTE — Telephone Encounter (Signed)
Rx complete.

## 2013-11-06 NOTE — Telephone Encounter (Signed)
Ok done

## 2013-12-10 ENCOUNTER — Other Ambulatory Visit: Payer: Self-pay | Admitting: Internal Medicine

## 2013-12-14 ENCOUNTER — Other Ambulatory Visit: Payer: Self-pay | Admitting: Internal Medicine

## 2014-02-12 ENCOUNTER — Telehealth: Payer: Self-pay | Admitting: Internal Medicine

## 2014-02-12 ENCOUNTER — Other Ambulatory Visit: Payer: Self-pay

## 2014-02-12 MED ORDER — LORAZEPAM 0.5 MG PO TABS: 0.5000 mg | ORAL_TABLET | Freq: Three times a day (TID) | ORAL | Status: DC

## 2014-02-12 NOTE — Telephone Encounter (Signed)
Informed pt of the temporary change in rx to Lorazepam.   Pt stated that he is having nightmares (since a child), stopped the Zoloft 3 days ago and also is experiencing sleep paralysis.   Pt stated that he only sleeps about 2 hours at a time. Instructed pt that he should dedicate more hours to sleep a night and to take earlier in the night and not at 3am.

## 2014-02-12 NOTE — Telephone Encounter (Signed)
Agree with trial lorazepam as recommended by Hopp thanks

## 2014-02-12 NOTE — Telephone Encounter (Signed)
Pt states he's still having severe sleep disturbances.  He does not like clonazePAM (KLONOPIN) 0.5 MG tablet, it keeps him groggy through out the next day.  He has heard bad reviews about Lorrin Mais and would prefer not to try it.  He is scheduled to see Dr. Asa Lente on 03/10/14 but would like to know if he can have a medication for sleep until he sees her??

## 2014-02-12 NOTE — Telephone Encounter (Signed)
Lorazepam 0.25 mg qhs prn  #10

## 2014-03-10 ENCOUNTER — Ambulatory Visit: Payer: 59 | Admitting: Internal Medicine

## 2014-03-10 DIAGNOSIS — Z0289 Encounter for other administrative examinations: Secondary | ICD-10-CM

## 2014-04-04 ENCOUNTER — Other Ambulatory Visit: Payer: Self-pay | Admitting: Internal Medicine

## 2014-05-10 ENCOUNTER — Other Ambulatory Visit: Payer: Self-pay | Admitting: Internal Medicine

## 2014-06-03 ENCOUNTER — Other Ambulatory Visit: Payer: Self-pay | Admitting: Internal Medicine

## 2014-06-07 ENCOUNTER — Other Ambulatory Visit: Payer: Self-pay | Admitting: Geriatric Medicine

## 2014-06-07 ENCOUNTER — Other Ambulatory Visit: Payer: Self-pay

## 2014-06-07 NOTE — Telephone Encounter (Signed)
Pt is request refill for clonazepam. Is this okay to refill?

## 2014-06-08 MED ORDER — CLONAZEPAM 0.5 MG PO TABS: 0.5000 mg | ORAL_TABLET | Freq: Two times a day (BID) | ORAL | Status: DC | PRN

## 2014-06-08 NOTE — Telephone Encounter (Signed)
RX sent to pharmacy  

## 2014-07-02 ENCOUNTER — Other Ambulatory Visit: Payer: Self-pay | Admitting: Internal Medicine

## 2014-07-14 ENCOUNTER — Other Ambulatory Visit: Payer: Self-pay | Admitting: Internal Medicine

## 2014-08-04 ENCOUNTER — Other Ambulatory Visit: Payer: Self-pay | Admitting: Internal Medicine

## 2014-08-22 ENCOUNTER — Other Ambulatory Visit: Payer: Self-pay | Admitting: Internal Medicine

## 2014-10-17 ENCOUNTER — Other Ambulatory Visit: Payer: Self-pay | Admitting: Internal Medicine

## 2014-10-18 ENCOUNTER — Other Ambulatory Visit: Payer: Self-pay

## 2014-10-18 MED ORDER — HYDRALAZINE HCL 50 MG PO TABS: ORAL_TABLET | ORAL | Status: DC

## 2014-10-18 NOTE — Telephone Encounter (Signed)
Called patient, advised we have refilled rx, and i have scheduled patient for follow up appt with greg calone for bp med management

## 2014-10-26 ENCOUNTER — Ambulatory Visit: Payer: Self-pay | Admitting: Family

## 2014-11-03 ENCOUNTER — Other Ambulatory Visit (INDEPENDENT_AMBULATORY_CARE_PROVIDER_SITE_OTHER): Payer: 59

## 2014-11-03 ENCOUNTER — Encounter: Payer: Self-pay | Admitting: Family

## 2014-11-03 ENCOUNTER — Ambulatory Visit (INDEPENDENT_AMBULATORY_CARE_PROVIDER_SITE_OTHER): Payer: 59 | Admitting: Family

## 2014-11-03 VITALS — BP 122/90 | HR 62 | Temp 97.9°F | Resp 18 | Ht 73.0 in | Wt 216.0 lb

## 2014-11-03 DIAGNOSIS — I10 Essential (primary) hypertension: Secondary | ICD-10-CM

## 2014-11-03 DIAGNOSIS — F419 Anxiety disorder, unspecified: Secondary | ICD-10-CM

## 2014-11-03 LAB — COMPREHENSIVE METABOLIC PANEL
ALK PHOS: 77 U/L (ref 39–117)
ALT: 17 U/L (ref 0–53)
AST: 23 U/L (ref 0–37)
Albumin: 4.2 g/dL (ref 3.5–5.2)
BUN: 36 mg/dL — AB (ref 6–23)
CO2: 29 mEq/L (ref 19–32)
Calcium: 9.9 mg/dL (ref 8.4–10.5)
Chloride: 100 mEq/L (ref 96–112)
Creatinine, Ser: 2.81 mg/dL — ABNORMAL HIGH (ref 0.40–1.50)
GFR: 30.05 mL/min — ABNORMAL LOW (ref >60.00)
Glucose, Bld: 101 mg/dL — ABNORMAL HIGH (ref 70–99)
POTASSIUM: 3.8 meq/L (ref 3.5–5.1)
SODIUM: 135 meq/L (ref 135–145)
TOTAL PROTEIN: 8 g/dL (ref 6.0–8.3)
Total Bilirubin: 0.3 mg/dL (ref 0.2–1.2)

## 2014-11-03 MED ORDER — HYDRALAZINE HCL 50 MG PO TABS: ORAL_TABLET | ORAL | Status: DC

## 2014-11-03 MED ORDER — CLONAZEPAM 0.5 MG PO TABS: 0.5000 mg | ORAL_TABLET | Freq: Two times a day (BID) | ORAL | Status: DC | PRN

## 2014-11-03 MED ORDER — AMLODIPINE BESYLATE 10 MG PO TABS: 10.0000 mg | ORAL_TABLET | Freq: Every day | ORAL | Status: DC

## 2014-11-03 MED ORDER — CLONIDINE HCL 0.3 MG PO TABS: 0.3000 mg | ORAL_TABLET | Freq: Three times a day (TID) | ORAL | Status: DC

## 2014-11-03 MED ORDER — FUROSEMIDE 80 MG PO TABS: 80.0000 mg | ORAL_TABLET | Freq: Two times a day (BID) | ORAL | Status: DC

## 2014-11-03 NOTE — Assessment & Plan Note (Signed)
Hypertension is well controlled with current regimen and blood pressure remains below goal of 140/90. Continue current dosages of amlodipine, clonidine, furosemide, hydralazine, labetalol, and telmisartan. Obtain CMET to check kidney and electrolyte function. Follow up in 3-6 months.

## 2014-11-03 NOTE — Progress Notes (Signed)
Pre visit review using our clinic review tool, if applicable. No additional management support is needed unless otherwise documented below in the visit note. 

## 2014-11-03 NOTE — Assessment & Plan Note (Signed)
Anxiety is controlled with current regimen of clonazepam. Was prescribed lorazepam and has since run out. Discontinue lorazepam. Refill clonazepam. Follow up if symptoms worsen or fail to improve.

## 2014-11-03 NOTE — Patient Instructions (Signed)
Thank you for choosing [facility].  Summary/Instructions:  Your prescription(s) have been submitted to your pharmacy or been printed and provided for you. Please take as directed and contact our office if you believe you are having problem(s) with the medication(s) or have any questions.  Please stop by the lab on the basement level of the building for your blood work. Your results will be released to [place] (or called to you) after review, usually within 72 hours after test completion. If any changes need to be made, you will be notified at that same time.  Please continue to take your medications as prescribed.

## 2014-11-03 NOTE — Progress Notes (Signed)
   Subjective:    Patient ID: [name], male    DOB: 1956/02/08, 59 y.o.   MRN: [id number]  Chief Complaint  Patient presents with  . Follow-up    would like a refill of lorazepam, needs a refill on BP    HPI:  [name] is a 59 y.o. male with a PMH of hypertension, heart failure, obstructive sleep apnea, dyslipidemia, anemia, and hyperparathyroidism who presents today for an office visit.  1) Hypertension - Blood pressure is stable at home and averages around 120/80. Current medications clonidine, furosemide, hydralzine, labetalol,  And telmisartan. Taking the medications as prescribed and denies adverse side effects. Does note increased urination with the furosemide.   BP Readings from Last 3 Encounters:  11/03/14 122/90  09/07/13 110/80  03/09/13 182/120    2) Anxiety - Stable with current medication regimen of lorazepam and controls his anxiety. Takes his medication as prescribed and denies any adverse side effects.   No Known Allergies   Current Outpatient Prescriptions on File Prior to Visit  Medication Sig Dispense Refill  . aspirin 81 MG tablet Take 81 mg by mouth daily.      . calcium gluconate 500 MG tablet Take 2 tablets (1,000 mg total) by mouth daily. Alternate 1-2 pills every other day 60 tablet 6  . labetalol (NORMODYNE) 200 MG tablet Take 2 tablets (400 mg total) by mouth 3 (three) times daily. 720 tablet 1  . loratadine (CLARITIN) 10 MG tablet Take 1 tablet (10 mg total) by mouth daily as needed for allergies. 30 tablet 2  . LORazepam (ATIVAN) 0.5 MG tablet Take 1 tablet (0.5 mg total) by mouth every 8 (eight) hours. Take 1/2 to 1 tablet (0.25mg  total) by mouth at bedtime as needed for sleep. 10 tablet 0  . Omega-3 Fatty Acids (FISH OIL) 1200 MG CPDR Take 1 capsule by mouth 2 (two) times daily. 60 capsule   . potassium chloride SA (KLOR-CON M20) 20 MEQ tablet Take 1 tablet (20 mEq total) by mouth daily. 30 tablet 11  . telmisartan (MICARDIS) 80 MG tablet  Take 1 tablet (80 mg total) by mouth daily. 30 tablet 5  . VIAGRA 100 MG tablet TAKE 1 TABLET BY MOUTH DAILY AS NEEDED 10 tablet 0   No current facility-administered medications on file prior to visit.    Review of Systems  Eyes:       Negative for changes in vision.   Respiratory: Negative for chest tightness and shortness of breath.   Cardiovascular: Negative for chest pain, palpitations and leg swelling.  Neurological: Negative for headaches.      Objective:    BP 122/90 mmHg  Pulse 62  Temp(Src) 97.9 F (36.6 C) (Oral)  Resp 18  Ht 6\' 1"  (1.854 m)  Wt 216 lb (97.977 kg)  BMI 28.50 kg/m2  SpO2 98% Nursing note and vital signs reviewed.  Physical Exam  Constitutional: He is oriented to person, place, and time. He appears well-developed and well-nourished. No distress.  Cardiovascular: Normal rate, regular rhythm, normal heart sounds and intact distal pulses.   Pulmonary/Chest: Effort normal and breath sounds normal.  Neurological: He is alert and oriented to person, place, and time.  Skin: Skin is warm and dry.  Psychiatric: He has a normal mood and affect. His behavior is normal. Judgment and thought content normal.       Assessment & Plan:

## 2014-11-04 ENCOUNTER — Telehealth: Payer: Self-pay | Admitting: Family

## 2014-11-04 NOTE — Telephone Encounter (Signed)
Please inform patient that his blood work indicates that his electrolytes are within the normal range and his kidney function is stable at this time.

## 2014-11-08 NOTE — Telephone Encounter (Signed)
Pt aware of results 

## 2014-11-12 ENCOUNTER — Other Ambulatory Visit: Payer: Self-pay | Admitting: Internal Medicine

## 2015-01-26 ENCOUNTER — Other Ambulatory Visit: Payer: Self-pay | Admitting: Internal Medicine

## 2015-03-17 ENCOUNTER — Other Ambulatory Visit: Payer: Self-pay | Admitting: Internal Medicine

## 2015-05-16 ENCOUNTER — Ambulatory Visit (INDEPENDENT_AMBULATORY_CARE_PROVIDER_SITE_OTHER): Payer: 59 | Admitting: Internal Medicine

## 2015-05-16 ENCOUNTER — Other Ambulatory Visit (INDEPENDENT_AMBULATORY_CARE_PROVIDER_SITE_OTHER): Payer: 59

## 2015-05-16 ENCOUNTER — Encounter: Payer: Self-pay | Admitting: Internal Medicine

## 2015-05-16 VITALS — BP 128/80 | HR 67 | Temp 97.8°F | Ht 73.0 in | Wt 214.0 lb

## 2015-05-16 DIAGNOSIS — M1000 Idiopathic gout, unspecified site: Secondary | ICD-10-CM | POA: Diagnosis not present

## 2015-05-16 DIAGNOSIS — E785 Hyperlipidemia, unspecified: Secondary | ICD-10-CM | POA: Diagnosis not present

## 2015-05-16 DIAGNOSIS — I5022 Chronic systolic (congestive) heart failure: Secondary | ICD-10-CM

## 2015-05-16 DIAGNOSIS — Z1159 Encounter for screening for other viral diseases: Secondary | ICD-10-CM

## 2015-05-16 DIAGNOSIS — D649 Anemia, unspecified: Secondary | ICD-10-CM

## 2015-05-16 DIAGNOSIS — N184 Chronic kidney disease, stage 4 (severe): Secondary | ICD-10-CM

## 2015-05-16 DIAGNOSIS — Z0000 Encounter for general adult medical examination without abnormal findings: Secondary | ICD-10-CM | POA: Diagnosis not present

## 2015-05-16 DIAGNOSIS — Z114 Encounter for screening for human immunodeficiency virus [HIV]: Secondary | ICD-10-CM

## 2015-05-16 DIAGNOSIS — M109 Gout, unspecified: Secondary | ICD-10-CM | POA: Insufficient documentation

## 2015-05-16 LAB — CBC WITH DIFFERENTIAL/PLATELET
BASOS ABS: 0 10*3/uL (ref 0.0–0.1)
Basophils Relative: 0.4 % (ref 0.0–3.0)
EOS ABS: 0.3 10*3/uL (ref 0.0–0.7)
Eosinophils Relative: 3.3 % (ref 0.0–5.0)
HEMATOCRIT: 35.2 % — AB (ref 39.0–52.0)
Hemoglobin: 11.9 g/dL — ABNORMAL LOW (ref 13.0–17.0)
LYMPHS PCT: 19.9 % (ref 12.0–46.0)
Lymphs Abs: 1.6 10*3/uL (ref 0.7–4.0)
MCHC: 33.8 g/dL (ref 30.0–36.0)
MCV: 83.9 fl (ref 78.0–100.0)
MONOS PCT: 6.4 % (ref 3.0–12.0)
Monocytes Absolute: 0.5 10*3/uL (ref 0.1–1.0)
NEUTROS ABS: 5.5 10*3/uL (ref 1.4–7.7)
Neutrophils Relative %: 70 % (ref 43.0–77.0)
PLATELETS: 265 10*3/uL (ref 150.0–400.0)
RBC: 4.19 Mil/uL — AB (ref 4.22–5.81)
RDW: 13.3 % (ref 11.5–15.5)
WBC: 7.9 10*3/uL (ref 4.0–10.5)

## 2015-05-16 LAB — LIPID PANEL
Cholesterol: 143 mg/dL (ref 0–200)
HDL: 33 mg/dL — AB (ref >39.00)
LDL Cholesterol: 77 mg/dL (ref 0–99)
NonHDL: 110.02
TRIGLYCERIDES: 167 mg/dL — AB (ref 0.0–149.0)
Total CHOL/HDL Ratio: 4
VLDL: 33.4 mg/dL (ref 0.0–40.0)

## 2015-05-16 LAB — HEPATIC FUNCTION PANEL
ALBUMIN: 3.7 g/dL (ref 3.5–5.2)
ALT: 25 U/L (ref 0–53)
AST: 23 U/L (ref 0–37)
Alkaline Phosphatase: 68 U/L (ref 39–117)
BILIRUBIN DIRECT: 0.1 mg/dL (ref 0.0–0.3)
TOTAL PROTEIN: 6.9 g/dL (ref 6.0–8.3)
Total Bilirubin: 0.4 mg/dL (ref 0.2–1.2)

## 2015-05-16 LAB — BASIC METABOLIC PANEL
BUN: 24 mg/dL — AB (ref 6–23)
CHLORIDE: 101 meq/L (ref 96–112)
CO2: 30 meq/L (ref 19–32)
Calcium: 9.2 mg/dL (ref 8.4–10.5)
Creatinine, Ser: 2.28 mg/dL — ABNORMAL HIGH (ref 0.40–1.50)
GFR: 38.17 mL/min — AB (ref >60.00)
GLUCOSE: 99 mg/dL (ref 70–99)
POTASSIUM: 3.3 meq/L — AB (ref 3.5–5.1)
SODIUM: 138 meq/L (ref 135–145)

## 2015-05-16 LAB — HEPATITIS C ANTIBODY: HCV Ab: NEGATIVE

## 2015-05-16 LAB — TSH: TSH: 1.65 u[IU]/mL (ref 0.35–4.50)

## 2015-05-16 LAB — HIV ANTIBODY (ROUTINE TESTING W REFLEX): HIV: NONREACTIVE

## 2015-05-16 MED ORDER — HYDRALAZINE HCL 50 MG PO TABS: 50.0000 mg | ORAL_TABLET | Freq: Four times a day (QID) | ORAL | Status: DC

## 2015-05-16 NOTE — Progress Notes (Signed)
Subjective:    Patient ID: [name], male    DOB: 1955-09-10, 59 y.o.   MRN: [id number]  HPI  patient is here today for annual physical. Patient feels well and has no complaints. Also reviewed chronic medical conditions, interval events and current concerns  Past Medical History  Diagnosis Date  . Chronic systolic heart failure (HCC)     NICM - LVEF 30% echo 12/2008; improved to 50% on echo 03/2012  . ERECTILE DYSFUNCTION, ORGANIC   . ANEMIA   . DYSLIPIDEMIA   . HYPERTENSION   . CHRONIC KIDNEY DISEASE STAGE III (MODERATE)   . Hyperparathyroidism, secondary ([place])   . Anxiety   . OSA (obstructive sleep apnea)     mild, CPAP not recommended 08/2012  . Gout     1st attack 04/2015, diet changes to treat same   Family History  Problem Relation Age of Onset  . Hypertension Mother   . Hypertension Father   . COPD Father   . Coronary artery disease Father   . Coronary artery disease Brother 43    sudden death with MI   Social History  Substance Use Topics  . Smoking status: Never Smoker   . Smokeless tobacco: None  . Alcohol Use: No    Review of Systems  Constitutional: Negative for fever, activity change, appetite change, fatigue and unexpected weight change.  Respiratory: Negative for cough, chest tightness, shortness of breath and wheezing.   Cardiovascular: Negative for chest pain, palpitations and leg swelling.  Neurological: Negative for dizziness, weakness and headaches.  Psychiatric/Behavioral: Negative for dysphoric mood. The patient is not nervous/anxious.   All other systems reviewed and are negative.      Objective:    Physical Exam  Constitutional: He is oriented to person, place, and time. He appears well-developed and well-nourished. No distress.  HENT:  Head: Normocephalic and atraumatic.  Nose: Nose normal.  Mouth/Throat: Oropharynx is clear and moist.  Hearing grossly normal.  Eyes: Conjunctivae and EOM are normal. Pupils are equal, round, and  reactive to light. No scleral icterus.  Neck: Normal range of motion. Neck supple. No JVD present. No thyromegaly present.  Cardiovascular: Normal rate, regular rhythm, normal heart sounds and intact distal pulses.  Exam reveals no friction rub.   No murmur heard. No edema.  Pulmonary/Chest: Effort normal and breath sounds normal. No respiratory distress. He has no wheezes.  Abdominal: Soft. Bowel sounds are normal. He exhibits no distension and no mass. There is no tenderness. There is no guarding.  Genitourinary:  defer  Musculoskeletal: Normal range of motion. He exhibits no edema or tenderness.  Lymphadenopathy:    He has no cervical adenopathy.  Neurological: He is alert and oriented to person, place, and time. He has normal reflexes. No cranial nerve deficit.  Skin: Skin is warm and dry. No rash noted. No erythema.  Psychiatric: He has a normal mood and affect. His behavior is normal. Thought content normal.    BP 128/80 mmHg  Pulse 67  Temp(Src) 97.8 F (36.6 C) (Oral)  Ht 6\' 1"  (1.854 m)  Wt 214 lb (97.07 kg)  BMI 28.24 kg/m2  SpO2 97% Wt Readings from Last 3 Encounters:  05/16/15 214 lb (97.07 kg)  11/03/14 216 lb (97.977 kg)  09/07/13 225 lb 6.4 oz (102.241 kg)     Lab Results  Component Value Date   WBC 5.4 03/13/2012   HGB 12.6* 03/13/2012   HCT 37.6* 03/13/2012   PLT 194.0 03/13/2012  GLUCOSE 101* 11/03/2014   CHOL 212* 03/09/2013   TRIG 125.0 03/09/2013   HDL 41.90 03/09/2013   LDLDIRECT 128.5 03/09/2013   LDLCALC 127* 03/13/2012   ALT 17 11/03/2014   AST 23 11/03/2014   NA 135 11/03/2014   K 3.8 11/03/2014   CL 100 11/03/2014   CREATININE 2.81* 11/03/2014   BUN 36* 11/03/2014   CO2 29 11/03/2014   TSH 1.52 03/09/2013    No results found.     Assessment & Plan:   CPX/z00.00 - Patient has been counseled on age-appropriate routine health concerns for screening and prevention. These are reviewed and up-to-date. Immunizations are up-to-date or  declined. Labs ordered and reviewed.  Problem List Items Addressed This Visit    ANEMIA    Related to chronic kidney disease Mgmt per renal      Relevant Orders   CBC with Differential/Platelet   CHRONIC SYSTOLIC HEART FAILURE    NICM - (LVEF 30% echo 12/2008 in setting of uncontrolled BP); improved on 03/2012 echo with LVEF 50-55% Euvolemic, symptoms stable -  No longer follows with cards - will schedule follow up echo now The current medical regimen is effective;  continue present plan and medications.      Relevant Medications   hydrALAZINE (APRESOLINE) 50 MG tablet   Other Relevant Orders   Hepatic function panel   TSH   ECHOCARDIOGRAM COMPLETE   CKD (chronic kidney disease) stage 4, GFR 15-29 ml/min (HCC)    Follows with renal ([place]) -  Renal fx stable by report The current medical regimen is effective;  continue present plan and medications.       Relevant Orders   Basic metabolic panel   CBC with Differential/Platelet   Dyslipidemia    Prev on lovaza - stopped because of cost -  Now on OTC fish oil and aerobic exercise for weight control/reduction Check lipids annually for risk reduction      Relevant Orders   Lipid panel   Gout    Dx with first flare 04/2015 following excess asparagus intake - Med mgmt with pred, also colchicine per renal symptoms abated for now Managing with diet modification for now      Relevant Orders   Hepatic function panel    Other Visit Diagnoses    Routine general medical examination at a health care facility    -  Primary    Need for hepatitis C screening test        Relevant Orders    Hepatitis C antibody    Screening for HIV (human immunodeficiency virus)        Relevant Orders    HIV antibody        Gwendolyn Grant, MD

## 2015-05-16 NOTE — Patient Instructions (Addendum)
It was good to see you today.  We have reviewed your prior records including labs and tests today  Health Maintenance reviewed - all recommended immunizations and age-appropriate screenings are up-to-date.  Test(s) ordered today. Your results will be released to [place] (or called to you) after review, usually within 72hours after test completion. If any changes need to be made, you will be notified at that same time.  Medications reviewed and updated, no changes recommended at this time.  we'll make referral for heart ultrasound (echocardiogram) to look at pump function . Our office will contact you regarding appointment(s) once made.  Please schedule followup in 6 months for semiannual exam with Dr. Quay Burow, call sooner if problems.  Health Maintenance, Male A healthy lifestyle and preventative care can promote health and wellness.  Maintain regular health, dental, and eye exams.  Eat a healthy diet. Foods like vegetables, fruits, whole grains, low-fat dairy products, and lean protein foods contain the nutrients you need and are low in calories. Decrease your intake of foods high in solid fats, added sugars, and salt. Get information about a proper diet from your health care provider, if necessary.  Regular physical exercise is one of the most important things you can do for your health. Most adults should get at least 150 minutes of moderate-intensity exercise (any activity that increases your heart rate and causes you to sweat) each week. In addition, most adults need muscle-strengthening exercises on 2 or more days a week.   Maintain a healthy weight. The body mass index (BMI) is a screening tool to identify possible weight problems. It provides an estimate of body fat based on height and weight. Your health care provider can find your BMI and can help you achieve or maintain a healthy weight. For males 20 years and older:  A BMI below 18.5 is considered underweight.  A BMI of 18.5 to  24.9 is normal.  A BMI of 25 to 29.9 is considered overweight.  A BMI of 30 and above is considered obese.  Maintain normal blood lipids and cholesterol by exercising and minimizing your intake of saturated fat. Eat a balanced diet with plenty of fruits and vegetables. Blood tests for lipids and cholesterol should begin at age 68 and be repeated every 5 years. If your lipid or cholesterol levels are high, you are over age 31, or you are at high risk for heart disease, you may need your cholesterol levels checked more frequently.Ongoing high lipid and cholesterol levels should be treated with medicines if diet and exercise are not working.  If you smoke, find out from your health care provider how to quit. If you do not use tobacco, do not start.  Lung cancer screening is recommended for adults aged 66-80 years who are at high risk for developing lung cancer because of a history of smoking. A yearly low-dose CT scan of the lungs is recommended for people who have at least a 30-pack-year history of smoking and are current smokers or have quit within the past 15 years. A pack year of smoking is smoking an average of 1 pack of cigarettes a day for 1 year (for example, a 30-pack-year history of smoking could mean smoking 1 pack a day for 30 years or 2 packs a day for 15 years). Yearly screening should continue until the smoker has stopped smoking for at least 15 years. Yearly screening should be stopped for people who develop a health problem that would prevent them from having lung cancer  treatment.  If you choose to drink alcohol, do not have more than 2 drinks per day. One drink is considered to be 12 oz (360 mL) of beer, 5 oz (150 mL) of wine, or 1.5 oz (45 mL) of liquor.  Avoid the use of street drugs. Do not share needles with anyone. Ask for help if you need support or instructions about stopping the use of drugs.  High blood pressure causes heart disease and increases the risk of stroke. High  blood pressure is more likely to develop in:  People who have blood pressure in the end of the normal range (100-139/85-89 mm Hg).  People who are overweight or obese.  People who are African American.  If you are 56-90 years of age, have your blood pressure checked every 3-5 years. If you are 73 years of age or older, have your blood pressure checked every year. You should have your blood pressure measured twice--once when you are at a hospital or clinic, and once when you are not at a hospital or clinic. Record the average of the two measurements. To check your blood pressure when you are not at a hospital or clinic, you can use:  An automated blood pressure machine at a pharmacy.  A home blood pressure monitor.  If you are 18-25 years old, ask your health care provider if you should take aspirin to prevent heart disease.  Diabetes screening involves taking a blood sample to check your fasting blood sugar level. This should be done once every 3 years after age 61 if you are at a normal weight and without risk factors for diabetes. Testing should be considered at a younger age or be carried out more frequently if you are overweight and have at least 1 risk factor for diabetes.  Colorectal cancer can be detected and often prevented. Most routine colorectal cancer screening begins at the age of 35 and continues through age 69. However, your health care provider may recommend screening at an earlier age if you have risk factors for colon cancer. On a yearly basis, your health care provider may provide home test kits to check for hidden blood in the stool. A small camera at the end of a tube may be used to directly examine the colon (sigmoidoscopy or colonoscopy) to detect the earliest forms of colorectal cancer. Talk to your health care provider about this at age 48 when routine screening begins. A direct exam of the colon should be repeated every 5-10 years through age 52, unless early forms of  precancerous polyps or small growths are found.  People who are at an increased risk for hepatitis B should be screened for this virus. You are considered at high risk for hepatitis B if:  You were born in a country where hepatitis B occurs often. Talk with your health care provider about which countries are considered high risk.  Your parents were born in a high-risk country and you have not received a shot to protect against hepatitis B (hepatitis B vaccine).  You have HIV or AIDS.  You use needles to inject street drugs.  You live with, or have sex with, someone who has hepatitis B.  You are a man who has sex with other men (MSM).  You get hemodialysis treatment.  You take certain medicines for conditions like cancer, organ transplantation, and autoimmune conditions.  Hepatitis C blood testing is recommended for all people born from [birth year] through 1963 and any individual with known risk factors for  hepatitis C.  Healthy men should no longer receive prostate-specific antigen (PSA) blood tests as part of routine cancer screening. Talk to your health care provider about prostate cancer screening.  Testicular cancer screening is not recommended for adolescents or adult males who have no symptoms. Screening includes self-exam, a health care provider exam, and other screening tests. Consult with your health care provider about any symptoms you have or any concerns you have about testicular cancer.  Practice safe sex. Use condoms and avoid high-risk sexual practices to reduce the spread of sexually transmitted infections (STIs).  You should be screened for STIs, including gonorrhea and chlamydia if:  You are sexually active and are younger than 24 years.  You are older than 24 years, and your health care provider tells you that you are at risk for this type of infection.  Your sexual activity has changed since you were last screened, and you are at an increased risk for chlamydia or  gonorrhea. Ask your health care provider if you are at risk.  If you are at risk of being infected with HIV, it is recommended that you take a prescription medicine daily to prevent HIV infection. This is called pre-exposure prophylaxis (PrEP). You are considered at risk if:  You are a man who has sex with other men (MSM).  You are a heterosexual man who is sexually active with multiple partners.  You take drugs by injection.  You are sexually active with a partner who has HIV.  Talk with your health care provider about whether you are at high risk of being infected with HIV. If you choose to begin PrEP, you should first be tested for HIV. You should then be tested every 3 months for as long as you are taking PrEP.  Use sunscreen. Apply sunscreen liberally and repeatedly throughout the day. You should seek shade when your shadow is shorter than you. Protect yourself by wearing long sleeves, pants, a wide-brimmed hat, and sunglasses year round whenever you are outdoors.  Tell your health care provider of new moles or changes in moles, especially if there is a change in shape or color. Also, tell your health care provider if a mole is larger than the size of a pencil eraser.  A one-time screening for abdominal aortic aneurysm (AAA) and surgical repair of large AAAs by ultrasound is recommended for men aged 82-75 years who are current or former smokers.  Stay current with your vaccines (immunizations).   This information is not intended to replace advice given to you by your health care provider. Make sure you discuss any questions you have with your health care provider.   Document Released: 12/01/2007 Document Revised: 06/25/2014 Document Reviewed: 10/30/2010 Elsevier Interactive Patient Education [employer].

## 2015-05-16 NOTE — Assessment & Plan Note (Signed)
Related to chronic kidney disease Mgmt per renal

## 2015-05-16 NOTE — Assessment & Plan Note (Signed)
Prev on lovaza - stopped because of cost -  Now on OTC fish oil and aerobic exercise for weight control/reduction Check lipids annually for risk reduction

## 2015-05-16 NOTE — Assessment & Plan Note (Signed)
NICM - (LVEF 30% echo 12/2008 in setting of uncontrolled BP); improved on 03/2012 echo with LVEF 50-55% Euvolemic, symptoms stable -  No longer follows with cards - will schedule follow up echo now The current medical regimen is effective;  continue present plan and medications.

## 2015-05-16 NOTE — Progress Notes (Signed)
Pre visit review using our clinic review tool, if applicable. No additional management support is needed unless otherwise documented below in the visit note. 

## 2015-05-16 NOTE — Assessment & Plan Note (Signed)
Follows with renal ([facility]) -  Renal fx stable by report The current medical regimen is effective;  continue present plan and medications.

## 2015-05-16 NOTE — Assessment & Plan Note (Signed)
Dx with first flare 04/2015 following excess asparagus intake - Med mgmt with pred, also colchicine per renal symptoms abated for now Managing with diet modification for now

## 2015-05-27 ENCOUNTER — Other Ambulatory Visit: Payer: Self-pay

## 2015-05-27 ENCOUNTER — Ambulatory Visit (HOSPITAL_COMMUNITY): Payer: 59 | Attending: Cardiology

## 2015-05-27 DIAGNOSIS — G4733 Obstructive sleep apnea (adult) (pediatric): Secondary | ICD-10-CM | POA: Diagnosis not present

## 2015-05-27 DIAGNOSIS — I421 Obstructive hypertrophic cardiomyopathy: Secondary | ICD-10-CM | POA: Diagnosis present

## 2015-05-27 DIAGNOSIS — Z8249 Family history of ischemic heart disease and other diseases of the circulatory system: Secondary | ICD-10-CM | POA: Diagnosis not present

## 2015-05-27 DIAGNOSIS — I10 Essential (primary) hypertension: Secondary | ICD-10-CM | POA: Diagnosis not present

## 2015-05-27 DIAGNOSIS — I5022 Chronic systolic (congestive) heart failure: Secondary | ICD-10-CM | POA: Insufficient documentation

## 2015-05-27 DIAGNOSIS — I351 Nonrheumatic aortic (valve) insufficiency: Secondary | ICD-10-CM | POA: Insufficient documentation

## 2015-05-27 DIAGNOSIS — I77810 Thoracic aortic ectasia: Secondary | ICD-10-CM | POA: Diagnosis not present

## 2015-07-06 ENCOUNTER — Other Ambulatory Visit: Payer: Self-pay | Admitting: Internal Medicine

## 2015-07-06 ENCOUNTER — Other Ambulatory Visit: Payer: Self-pay | Admitting: Family

## 2015-08-20 ENCOUNTER — Other Ambulatory Visit: Payer: Self-pay | Admitting: Family

## 2015-10-30 ENCOUNTER — Other Ambulatory Visit: Payer: Self-pay | Admitting: Family

## 2015-10-31 ENCOUNTER — Ambulatory Visit (INDEPENDENT_AMBULATORY_CARE_PROVIDER_SITE_OTHER): Payer: 59 | Admitting: Internal Medicine

## 2015-10-31 ENCOUNTER — Encounter: Payer: Self-pay | Admitting: Internal Medicine

## 2015-10-31 VITALS — BP 158/104 | HR 57 | Temp 98.2°F | Resp 16 | Ht 73.0 in | Wt 213.0 lb

## 2015-10-31 DIAGNOSIS — M1000 Idiopathic gout, unspecified site: Secondary | ICD-10-CM | POA: Diagnosis not present

## 2015-10-31 DIAGNOSIS — I10 Essential (primary) hypertension: Secondary | ICD-10-CM | POA: Diagnosis not present

## 2015-10-31 DIAGNOSIS — I5022 Chronic systolic (congestive) heart failure: Secondary | ICD-10-CM

## 2015-10-31 DIAGNOSIS — N184 Chronic kidney disease, stage 4 (severe): Secondary | ICD-10-CM | POA: Diagnosis not present

## 2015-10-31 DIAGNOSIS — F419 Anxiety disorder, unspecified: Secondary | ICD-10-CM

## 2015-10-31 MED ORDER — AMLODIPINE BESYLATE 5 MG PO TABS: 5.0000 mg | ORAL_TABLET | Freq: Every day | ORAL | Status: DC

## 2015-10-31 MED ORDER — HYDRALAZINE HCL 50 MG PO TABS: 50.0000 mg | ORAL_TABLET | Freq: Three times a day (TID) | ORAL | Status: DC

## 2015-10-31 NOTE — Patient Instructions (Addendum)
Medications reviewed and updated.  Restart the hydralazine and take three times a day.  You may need to add the amlodipine back if your BP is not well controlled.     Monitor your BP regularly.      Please followup in 6 months

## 2015-10-31 NOTE — Progress Notes (Signed)
Pre visit review using our clinic review tool, if applicable. No additional management support is needed unless otherwise documented below in the visit note. 

## 2015-10-31 NOTE — Assessment & Plan Note (Signed)
Following with Dr. Jimmy Footman He will work hard on getting his blood pressure better controlled-discussed in detail appropriate way of taking his medications and he cannot reduce the medications without consulting with his doctors first

## 2015-10-31 NOTE — Assessment & Plan Note (Signed)
Euvolemic, no symptoms Continue healthy diet and regular exercise Work on getting blood pressure better controlled

## 2015-10-31 NOTE — Assessment & Plan Note (Signed)
Not currently controlled and very pleasant home He is not taking his medication appropriately reviewed this in detail He stopped the hydralazine because he was experiencing headaches, which was because he reduce the dose from twice daily to once daily. Advised he should be taking his medication 3 times a day, possibly 4 times a day Restart hydralazine 3 times daily Continue his other medications Will possibly need to restart amlodipine at 5 mg daily, but he will see how his blood pressure is first He has a follow-up with his nephrologist next month and medications can be adjusted at that time He will follow-up with me in 6 months, sooner if his blood pressure is not better controlled

## 2015-10-31 NOTE — Assessment & Plan Note (Signed)
First episode 04/2015 possibly from excessive asparagus and take Treated with prednisone and colchicine Has had a few other mild flares He feels that gout is related to amlodipine and stopped the medication We will keep him off amlodipine unless his blood pressure is not controlled and then he will restart it.

## 2015-10-31 NOTE — Progress Notes (Signed)
Subjective:    Patient ID: [name], male    DOB: 09-11-1955, 60 y.o.   MRN: [id number]  HPI He is here to establish with a new pcp.    Hypertension: He is taking his medication daily. He stopped taking hydralazine because it was causing him headaches - he did go from taking it twice a day to once a day - that's when the headaches started.  He thought he could reduce it and did not discuss this with any of his doctors.  He also stopped the amlodipine due to it causing gout.  He is compliant with a low sodium diet.   He denies chest pain, palpitations, edema, shortness of breath and regular headaches. He is exercising regularly.  He does monitor his blood pressure at home and it is variable.    Chronic kidney disease: He is following closely with nephrology.  Gout: He has had a few episodes of gout has been successfully treated with prednisone. He feels the gout was related to amlodipine and since stopping the medication he has not had any recurrences. He was given a prescription for allopurinol, but does not want to take it. He is concerned about possible side effects from medication and does not want to take medication because of side effects from the medication. He has not had any recurrences of gout since stopping the amlodipine.  Medications and allergies reviewed with patient and updated if appropriate.  Patient Active Problem List   Diagnosis Date Noted  . Gout   . Allergic rhinitis   . Sleep disturbance, unspecified   . OSA (obstructive sleep apnea) 09/05/2012  . Anxiety   . CRF (chronic renal failure) 07/31/2011  . ERECTILE DYSFUNCTION, ORGANIC 04/26/2009  . HYPERPARATHYROIDISM, HX OF 02/12/2009  . Dyslipidemia 01/25/2009  . CKD (chronic kidney disease) stage 4, GFR 15-29 ml/min (HCC)   . Essential hypertension 01/24/2009  . ANEMIA 12/26/2008  . Chronic systolic heart failure ([place]) 12/25/2008    Current Outpatient Prescriptions on File Prior to Visit  Medication Sig  Dispense Refill  . aspirin 81 MG tablet Take 81 mg by mouth daily.      . calcitRIOL (ROCALTROL) 0.5 MCG capsule Take 1 capsule (0.5 mcg total) by mouth daily.  6  . calcium gluconate 500 MG tablet Take 2 tablets (1,000 mg total) by mouth daily. Alternate 1-2 pills every other day 60 tablet 6  . clonazePAM (KLONOPIN) 0.5 MG tablet Take 1 tablet (0.5 mg total) by mouth 2 (two) times daily as needed for anxiety. 20 tablet 0  . cloNIDine (CATAPRES) 0.3 MG tablet TAKE 1 TABLET(0.3 MG) BY MOUTH THREE TIMES DAILY 270 tablet 2  . furosemide (LASIX) 80 MG tablet Take 1 tablet (80 mg total) by mouth 2 (two) times daily. 60 tablet 5  . labetalol (NORMODYNE) 200 MG tablet Take 1 tablet (200 mg total) by mouth 3 (three) times daily. --patient has scheduled may/2017 appt with new pcp-dr burns 540 tablet 1  . loratadine (CLARITIN) 10 MG tablet Take 1 tablet (10 mg total) by mouth daily as needed for allergies. 30 tablet 2  . Omega-3 Fatty Acids (FISH OIL) 1200 MG CPDR Take 1 capsule by mouth 2 (two) times daily. 60 capsule   . potassium chloride SA (KLOR-CON M20) 20 MEQ tablet Take 1 tablet (20 mEq total) by mouth daily. 30 tablet 11  . sildenafil (VIAGRA) 100 MG tablet Take 1 tablet (100 mg total) by mouth as needed for erectile dysfunction. 10 tablet  0  . telmisartan (MICARDIS) 80 MG tablet Take 1 tablet (80 mg total) by mouth daily. 30 tablet 5   No current facility-administered medications on file prior to visit.    Past Medical History  Diagnosis Date  . Chronic systolic heart failure (HCC)     NICM - LVEF 30% echo 12/2008; improved to 50% on echo 03/2012  . ERECTILE DYSFUNCTION, ORGANIC   . ANEMIA   . DYSLIPIDEMIA   . HYPERTENSION   . CHRONIC KIDNEY DISEASE STAGE III (MODERATE)   . Hyperparathyroidism, secondary ([place])   . Anxiety   . OSA (obstructive sleep apnea)     mild, CPAP not recommended 08/2012  . Gout     1st attack 04/2015, diet changes to treat same    Past Surgical History    Procedure Laterality Date  . Vasectomy      Social History   Social History  . Marital Status: Legally Separated    Spouse Name: N/A  . Number of Children: N/A  . Years of Education: N/A   Social History Main Topics  . Smoking status: Never Smoker   . Smokeless tobacco: Not on file  . Alcohol Use: No  . Drug Use: No  . Sexual Activity: Not on file   Other Topics Concern  . Not on file   Social History Narrative   Lives alone, divorced x's 3    Family History  Problem Relation Age of Onset  . Hypertension Mother   . Hypertension Father   . COPD Father   . Coronary artery disease Father   . Coronary artery disease Brother 32    sudden death with MI    Review of Systems  Constitutional: Negative for fever.  Respiratory: Negative for cough, shortness of breath and wheezing.   Cardiovascular: Negative for chest pain, palpitations and leg swelling.  Gastrointestinal:       Occasional gerd  Neurological: Positive for headaches (related to hydralazine - no longer taking). Negative for dizziness and light-headedness.       Objective:   Filed Vitals:   10/31/15 1028  BP: 158/104  Pulse: 57  Temp: 98.2 F (36.8 C)  Resp: 16   Filed Weights   10/31/15 1028  Weight: 213 lb (96.616 kg)   Body mass index is 28.11 kg/(m^2).   Physical Exam Constitutional: Appears well-developed and well-nourished. No distress.  Neck: Neck supple. No tracheal deviation present. No thyromegaly present.  No carotid bruit. No cervical adenopathy.   Cardiovascular: Normal rate, regular rhythm and normal heart sounds.   No murmur heard.  No edema Pulmonary/Chest: Effort normal and breath sounds normal. No respiratory distress. No wheezes.       Assessment & Plan:   See Problem List for Assessment and Plan of chronic medical problems.  F/u in 6 months sooner if needed

## 2015-10-31 NOTE — Assessment & Plan Note (Signed)
Takes clonazepam as needed Advised not taking regularly

## 2015-12-27 ENCOUNTER — Other Ambulatory Visit: Payer: Self-pay | Admitting: Family

## 2016-01-20 ENCOUNTER — Other Ambulatory Visit: Payer: Self-pay | Admitting: Family

## 2016-02-20 ENCOUNTER — Other Ambulatory Visit: Payer: Self-pay | Admitting: Internal Medicine

## 2016-03-02 ENCOUNTER — Encounter: Payer: Self-pay | Admitting: Student

## 2016-03-16 ENCOUNTER — Other Ambulatory Visit: Payer: Self-pay | Admitting: Internal Medicine

## 2016-04-16 ENCOUNTER — Other Ambulatory Visit: Payer: Self-pay | Admitting: Internal Medicine

## 2016-04-19 ENCOUNTER — Other Ambulatory Visit: Payer: Self-pay | Admitting: Internal Medicine

## 2016-04-23 ENCOUNTER — Other Ambulatory Visit: Payer: Self-pay | Admitting: *Deleted

## 2016-04-23 MED ORDER — CLONIDINE HCL 0.3 MG PO TABS: 0.3000 mg | ORAL_TABLET | Freq: Three times a day (TID) | ORAL | 0 refills | Status: DC

## 2016-05-02 ENCOUNTER — Ambulatory Visit: Payer: 59 | Admitting: Internal Medicine

## 2016-05-24 ENCOUNTER — Other Ambulatory Visit: Payer: Self-pay | Admitting: Internal Medicine

## 2016-06-26 ENCOUNTER — Other Ambulatory Visit: Payer: Self-pay | Admitting: Family

## 2016-06-27 ENCOUNTER — Other Ambulatory Visit: Payer: Self-pay | Admitting: Internal Medicine

## 2016-08-29 ENCOUNTER — Other Ambulatory Visit: Payer: Self-pay | Admitting: Family

## 2016-09-10 DIAGNOSIS — J810 Acute pulmonary edema: Secondary | ICD-10-CM | POA: Diagnosis not present

## 2016-09-10 DIAGNOSIS — N179 Acute kidney failure, unspecified: Secondary | ICD-10-CM | POA: Diagnosis not present

## 2016-09-11 DIAGNOSIS — N184 Chronic kidney disease, stage 4 (severe): Secondary | ICD-10-CM | POA: Diagnosis not present

## 2016-09-11 DIAGNOSIS — I160 Hypertensive urgency: Secondary | ICD-10-CM | POA: Diagnosis not present

## 2016-09-11 DIAGNOSIS — R0602 Shortness of breath: Secondary | ICD-10-CM | POA: Diagnosis not present

## 2016-09-11 DIAGNOSIS — R918 Other nonspecific abnormal finding of lung field: Secondary | ICD-10-CM | POA: Diagnosis not present

## 2016-09-11 DIAGNOSIS — I130 Hypertensive heart and chronic kidney disease with heart failure and stage 1 through stage 4 chronic kidney disease, or unspecified chronic kidney disease: Secondary | ICD-10-CM | POA: Diagnosis not present

## 2016-09-11 DIAGNOSIS — J811 Chronic pulmonary edema: Secondary | ICD-10-CM | POA: Diagnosis not present

## 2016-09-11 DIAGNOSIS — N179 Acute kidney failure, unspecified: Secondary | ICD-10-CM | POA: Diagnosis not present

## 2016-09-11 DIAGNOSIS — R0603 Acute respiratory distress: Secondary | ICD-10-CM | POA: Diagnosis not present

## 2016-09-11 DIAGNOSIS — I5023 Acute on chronic systolic (congestive) heart failure: Secondary | ICD-10-CM | POA: Diagnosis not present

## 2016-09-11 DIAGNOSIS — J810 Acute pulmonary edema: Secondary | ICD-10-CM | POA: Diagnosis not present

## 2016-09-24 DIAGNOSIS — N184 Chronic kidney disease, stage 4 (severe): Secondary | ICD-10-CM | POA: Diagnosis not present

## 2016-09-24 DIAGNOSIS — D631 Anemia in chronic kidney disease: Secondary | ICD-10-CM | POA: Diagnosis not present

## 2016-09-24 DIAGNOSIS — I129 Hypertensive chronic kidney disease with stage 1 through stage 4 chronic kidney disease, or unspecified chronic kidney disease: Secondary | ICD-10-CM | POA: Diagnosis not present

## 2016-09-24 DIAGNOSIS — N2581 Secondary hyperparathyroidism of renal origin: Secondary | ICD-10-CM | POA: Diagnosis not present

## 2016-11-19 DIAGNOSIS — I129 Hypertensive chronic kidney disease with stage 1 through stage 4 chronic kidney disease, or unspecified chronic kidney disease: Secondary | ICD-10-CM | POA: Diagnosis not present

## 2016-11-19 DIAGNOSIS — D631 Anemia in chronic kidney disease: Secondary | ICD-10-CM | POA: Diagnosis not present

## 2016-11-19 DIAGNOSIS — N184 Chronic kidney disease, stage 4 (severe): Secondary | ICD-10-CM | POA: Diagnosis not present

## 2016-11-19 DIAGNOSIS — N2581 Secondary hyperparathyroidism of renal origin: Secondary | ICD-10-CM | POA: Diagnosis not present

## 2017-01-07 DIAGNOSIS — N184 Chronic kidney disease, stage 4 (severe): Secondary | ICD-10-CM | POA: Diagnosis not present

## 2017-05-02 DIAGNOSIS — I5031 Acute diastolic (congestive) heart failure: Secondary | ICD-10-CM | POA: Diagnosis not present

## 2017-05-02 DIAGNOSIS — I10 Essential (primary) hypertension: Secondary | ICD-10-CM | POA: Diagnosis not present

## 2017-05-02 DIAGNOSIS — I110 Hypertensive heart disease with heart failure: Secondary | ICD-10-CM | POA: Diagnosis not present

## 2017-05-02 DIAGNOSIS — N179 Acute kidney failure, unspecified: Secondary | ICD-10-CM | POA: Diagnosis not present

## 2017-05-02 DIAGNOSIS — D649 Anemia, unspecified: Secondary | ICD-10-CM | POA: Diagnosis not present

## 2017-05-02 DIAGNOSIS — I130 Hypertensive heart and chronic kidney disease with heart failure and stage 1 through stage 4 chronic kidney disease, or unspecified chronic kidney disease: Secondary | ICD-10-CM | POA: Diagnosis not present

## 2017-05-02 DIAGNOSIS — I509 Heart failure, unspecified: Secondary | ICD-10-CM | POA: Diagnosis not present

## 2017-05-02 DIAGNOSIS — I5033 Acute on chronic diastolic (congestive) heart failure: Secondary | ICD-10-CM | POA: Diagnosis not present

## 2017-05-02 DIAGNOSIS — R0602 Shortness of breath: Secondary | ICD-10-CM | POA: Diagnosis not present

## 2017-05-02 DIAGNOSIS — I501 Left ventricular failure: Secondary | ICD-10-CM | POA: Diagnosis not present

## 2017-05-02 DIAGNOSIS — J90 Pleural effusion, not elsewhere classified: Secondary | ICD-10-CM | POA: Diagnosis not present

## 2017-05-03 DIAGNOSIS — R0602 Shortness of breath: Secondary | ICD-10-CM | POA: Diagnosis not present

## 2017-05-03 DIAGNOSIS — J90 Pleural effusion, not elsewhere classified: Secondary | ICD-10-CM | POA: Diagnosis not present

## 2017-05-14 DIAGNOSIS — N184 Chronic kidney disease, stage 4 (severe): Secondary | ICD-10-CM | POA: Diagnosis not present

## 2017-05-14 DIAGNOSIS — I129 Hypertensive chronic kidney disease with stage 1 through stage 4 chronic kidney disease, or unspecified chronic kidney disease: Secondary | ICD-10-CM | POA: Diagnosis not present

## 2017-05-14 DIAGNOSIS — M109 Gout, unspecified: Secondary | ICD-10-CM | POA: Diagnosis not present

## 2017-05-14 DIAGNOSIS — D631 Anemia in chronic kidney disease: Secondary | ICD-10-CM | POA: Diagnosis not present

## 2017-05-14 DIAGNOSIS — N183 Chronic kidney disease, stage 3 (moderate): Secondary | ICD-10-CM | POA: Diagnosis not present

## 2017-11-25 DIAGNOSIS — M109 Gout, unspecified: Secondary | ICD-10-CM | POA: Diagnosis not present

## 2017-11-25 DIAGNOSIS — N184 Chronic kidney disease, stage 4 (severe): Secondary | ICD-10-CM | POA: Diagnosis not present

## 2017-11-25 DIAGNOSIS — I129 Hypertensive chronic kidney disease with stage 1 through stage 4 chronic kidney disease, or unspecified chronic kidney disease: Secondary | ICD-10-CM | POA: Diagnosis not present

## 2017-11-25 DIAGNOSIS — N2581 Secondary hyperparathyroidism of renal origin: Secondary | ICD-10-CM | POA: Diagnosis not present

## 2017-11-25 DIAGNOSIS — N189 Chronic kidney disease, unspecified: Secondary | ICD-10-CM | POA: Diagnosis not present

## 2017-11-25 DIAGNOSIS — D631 Anemia in chronic kidney disease: Secondary | ICD-10-CM | POA: Diagnosis not present

## 2018-03-10 DIAGNOSIS — N184 Chronic kidney disease, stage 4 (severe): Secondary | ICD-10-CM | POA: Diagnosis not present

## 2018-03-10 DIAGNOSIS — I129 Hypertensive chronic kidney disease with stage 1 through stage 4 chronic kidney disease, or unspecified chronic kidney disease: Secondary | ICD-10-CM | POA: Diagnosis not present

## 2018-03-10 DIAGNOSIS — N189 Chronic kidney disease, unspecified: Secondary | ICD-10-CM | POA: Diagnosis not present

## 2018-03-10 DIAGNOSIS — D631 Anemia in chronic kidney disease: Secondary | ICD-10-CM | POA: Diagnosis not present

## 2018-05-12 DIAGNOSIS — N184 Chronic kidney disease, stage 4 (severe): Secondary | ICD-10-CM | POA: Diagnosis not present

## 2018-05-12 DIAGNOSIS — I129 Hypertensive chronic kidney disease with stage 1 through stage 4 chronic kidney disease, or unspecified chronic kidney disease: Secondary | ICD-10-CM | POA: Diagnosis not present

## 2018-05-12 DIAGNOSIS — N2581 Secondary hyperparathyroidism of renal origin: Secondary | ICD-10-CM | POA: Diagnosis not present

## 2018-05-12 DIAGNOSIS — D631 Anemia in chronic kidney disease: Secondary | ICD-10-CM | POA: Diagnosis not present

## 2018-05-12 DIAGNOSIS — N189 Chronic kidney disease, unspecified: Secondary | ICD-10-CM | POA: Diagnosis not present

## 2018-05-26 DIAGNOSIS — N184 Chronic kidney disease, stage 4 (severe): Secondary | ICD-10-CM | POA: Diagnosis not present

## 2018-05-26 DIAGNOSIS — I129 Hypertensive chronic kidney disease with stage 1 through stage 4 chronic kidney disease, or unspecified chronic kidney disease: Secondary | ICD-10-CM | POA: Diagnosis not present

## 2018-05-26 DIAGNOSIS — D631 Anemia in chronic kidney disease: Secondary | ICD-10-CM | POA: Diagnosis not present

## 2018-05-30 DIAGNOSIS — N184 Chronic kidney disease, stage 4 (severe): Secondary | ICD-10-CM | POA: Diagnosis not present

## 2018-05-31 ENCOUNTER — Encounter (HOSPITAL_COMMUNITY): Payer: Self-pay | Admitting: Emergency Medicine

## 2018-05-31 ENCOUNTER — Other Ambulatory Visit: Payer: Self-pay

## 2018-05-31 ENCOUNTER — Inpatient Hospital Stay (HOSPITAL_COMMUNITY): Payer: 59

## 2018-05-31 ENCOUNTER — Inpatient Hospital Stay (HOSPITAL_COMMUNITY)
Admission: EM | Admit: 2018-05-31 | Discharge: 2018-06-06 | DRG: 674 | Disposition: A | Discharge status: Home / Self Care | Payer: 59 | Attending: Family Medicine | Admitting: Family Medicine

## 2018-05-31 DIAGNOSIS — Z888 Allergy status to other drugs, medicaments and biological substances status: Secondary | ICD-10-CM

## 2018-05-31 DIAGNOSIS — I5022 Chronic systolic (congestive) heart failure: Secondary | ICD-10-CM | POA: Diagnosis not present

## 2018-05-31 DIAGNOSIS — N179 Acute kidney failure, unspecified: Secondary | ICD-10-CM | POA: Diagnosis not present

## 2018-05-31 DIAGNOSIS — D631 Anemia in chronic kidney disease: Secondary | ICD-10-CM | POA: Diagnosis present

## 2018-05-31 DIAGNOSIS — E785 Hyperlipidemia, unspecified: Secondary | ICD-10-CM | POA: Diagnosis not present

## 2018-05-31 DIAGNOSIS — I5032 Chronic diastolic (congestive) heart failure: Secondary | ICD-10-CM | POA: Diagnosis not present

## 2018-05-31 DIAGNOSIS — Z79899 Other long term (current) drug therapy: Secondary | ICD-10-CM | POA: Diagnosis not present

## 2018-05-31 DIAGNOSIS — Z825 Family history of asthma and other chronic lower respiratory diseases: Secondary | ICD-10-CM | POA: Diagnosis not present

## 2018-05-31 DIAGNOSIS — N186 End stage renal disease: Secondary | ICD-10-CM

## 2018-05-31 DIAGNOSIS — M109 Gout, unspecified: Secondary | ICD-10-CM | POA: Diagnosis present

## 2018-05-31 DIAGNOSIS — N189 Chronic kidney disease, unspecified: Secondary | ICD-10-CM | POA: Diagnosis present

## 2018-05-31 DIAGNOSIS — E876 Hypokalemia: Secondary | ICD-10-CM | POA: Diagnosis present

## 2018-05-31 DIAGNOSIS — I5042 Chronic combined systolic (congestive) and diastolic (congestive) heart failure: Secondary | ICD-10-CM | POA: Diagnosis present

## 2018-05-31 DIAGNOSIS — E860 Dehydration: Secondary | ICD-10-CM | POA: Diagnosis present

## 2018-05-31 DIAGNOSIS — G4733 Obstructive sleep apnea (adult) (pediatric): Secondary | ICD-10-CM | POA: Diagnosis present

## 2018-05-31 DIAGNOSIS — N2581 Secondary hyperparathyroidism of renal origin: Secondary | ICD-10-CM | POA: Diagnosis present

## 2018-05-31 DIAGNOSIS — I130 Hypertensive heart and chronic kidney disease with heart failure and stage 1 through stage 4 chronic kidney disease, or unspecified chronic kidney disease: Secondary | ICD-10-CM | POA: Diagnosis present

## 2018-05-31 DIAGNOSIS — N184 Chronic kidney disease, stage 4 (severe): Secondary | ICD-10-CM | POA: Diagnosis not present

## 2018-05-31 DIAGNOSIS — N529 Male erectile dysfunction, unspecified: Secondary | ICD-10-CM | POA: Diagnosis present

## 2018-05-31 DIAGNOSIS — Z8249 Family history of ischemic heart disease and other diseases of the circulatory system: Secondary | ICD-10-CM | POA: Diagnosis not present

## 2018-05-31 DIAGNOSIS — I428 Other cardiomyopathies: Secondary | ICD-10-CM | POA: Diagnosis present

## 2018-05-31 DIAGNOSIS — N171 Acute kidney failure with acute cortical necrosis: Secondary | ICD-10-CM | POA: Diagnosis not present

## 2018-05-31 DIAGNOSIS — Z7982 Long term (current) use of aspirin: Secondary | ICD-10-CM

## 2018-05-31 DIAGNOSIS — I10 Essential (primary) hypertension: Secondary | ICD-10-CM | POA: Diagnosis not present

## 2018-05-31 LAB — CBC
HCT: 28.2 % — ABNORMAL LOW (ref 39.0–52.0)
HEMATOCRIT: 31.2 % — AB (ref 39.0–52.0)
HEMOGLOBIN: 10.2 g/dL — AB (ref 13.0–17.0)
Hemoglobin: 9.4 g/dL — ABNORMAL LOW (ref 13.0–17.0)
MCH: 28.4 pg (ref 26.0–34.0)
MCH: 28.5 pg (ref 26.0–34.0)
MCHC: 32.7 g/dL (ref 30.0–36.0)
MCHC: 33.3 g/dL (ref 30.0–36.0)
MCV: 86.9 fL (ref 80.0–100.0)
MCV: 85.5 fL (ref 80.0–100.0)
NRBC: 0 % (ref 0.0–0.2)
Platelets: 154 10*3/uL (ref 150–400)
Platelets: 138 10*3/uL — ABNORMAL LOW (ref 150–400)
RBC: 3.59 MIL/uL — ABNORMAL LOW (ref 4.22–5.81)
RBC: 3.3 MIL/uL — ABNORMAL LOW (ref 4.22–5.81)
RDW: 12.7 % (ref 11.5–15.5)
RDW: 12.7 % (ref 11.5–15.5)
WBC: 5.7 10*3/uL (ref 4.0–10.5)
WBC: 6.5 10*3/uL (ref 4.0–10.5)
nRBC: 0 % (ref 0.0–0.2)

## 2018-05-31 LAB — URINALYSIS, ROUTINE W REFLEX MICROSCOPIC
BILIRUBIN URINE: NEGATIVE
Glucose, UA: NEGATIVE mg/dL
Hgb urine dipstick: NEGATIVE
KETONES UR: NEGATIVE mg/dL
LEUKOCYTES UA: NEGATIVE
NITRITE: NEGATIVE
PH: 5 (ref 5.0–8.0)
PROTEIN: NEGATIVE mg/dL
Specific Gravity, Urine: 1.011 (ref 1.005–1.030)

## 2018-05-31 LAB — CREATININE, SERUM
Creatinine, Ser: 11.02 mg/dL — ABNORMAL HIGH (ref 0.61–1.24)
GFR calc Af Amer: 5 mL/min — ABNORMAL LOW (ref >60)
GFR calc non Af Amer: 4 mL/min — ABNORMAL LOW (ref >60)

## 2018-05-31 LAB — BASIC METABOLIC PANEL
ANION GAP: 23 — AB (ref 5–15)
BUN: 205 mg/dL — ABNORMAL HIGH (ref 6–20)
CALCIUM: 10.6 mg/dL — AB (ref 8.9–10.3)
CHLORIDE: 81 mmol/L — AB (ref 98–111)
CO2: 32 mmol/L (ref 22–32)
Creatinine, Ser: 11.08 mg/dL — ABNORMAL HIGH (ref 0.61–1.24)
GFR calc non Af Amer: 4 mL/min — ABNORMAL LOW (ref >60)
GFR, EST AFRICAN AMERICAN: 5 mL/min — AB (ref >60)
Glucose, Bld: 127 mg/dL — ABNORMAL HIGH (ref 70–99)
Potassium: 3 mmol/L — ABNORMAL LOW (ref 3.5–5.1)
SODIUM: 136 mmol/L (ref 135–145)

## 2018-05-31 LAB — MAGNESIUM: Magnesium: 2.8 mg/dL — ABNORMAL HIGH (ref 1.7–2.4)

## 2018-05-31 LAB — PHOSPHORUS: Phosphorus: 9.1 mg/dL — ABNORMAL HIGH (ref 2.5–4.6)

## 2018-05-31 MED ORDER — HEPARIN SODIUM (PORCINE) 5000 UNIT/ML IJ SOLN
5000.0000 [IU] | Freq: Three times a day (TID) | INTRAMUSCULAR | Status: DC
  Filled 2018-05-31 (×7): qty 1

## 2018-05-31 MED ORDER — OMEGA-3-ACID ETHYL ESTERS 1 G PO CAPS
1.0000 g | ORAL_CAPSULE | Freq: Two times a day (BID) | ORAL | Status: DC
  Administered 2018-05-31 – 2018-06-06 (×12): 1 g via ORAL
  Filled 2018-05-31 (×12): qty 1

## 2018-05-31 MED ORDER — CLONIDINE HCL 0.2 MG PO TABS
0.3000 mg | ORAL_TABLET | Freq: Three times a day (TID) | ORAL | Status: DC
  Administered 2018-05-31 – 2018-06-06 (×18): 0.3 mg via ORAL
  Filled 2018-05-31 (×19): qty 1

## 2018-05-31 MED ORDER — FISH OIL 1200 MG PO CPDR: 1.0000 | DELAYED_RELEASE_CAPSULE | Freq: Two times a day (BID) | ORAL | Status: DC

## 2018-05-31 MED ORDER — LACTATED RINGERS IV BOLUS
500.0000 mL | Freq: Once | INTRAVENOUS | Status: AC
  Administered 2018-05-31: 500 mL via INTRAVENOUS

## 2018-05-31 MED ORDER — AMLODIPINE BESYLATE 5 MG PO TABS
5.0000 mg | ORAL_TABLET | Freq: Every day | ORAL | Status: DC
  Filled 2018-05-31 (×3): qty 1

## 2018-05-31 MED ORDER — HYDRALAZINE HCL 50 MG PO TABS
50.0000 mg | ORAL_TABLET | Freq: Three times a day (TID) | ORAL | Status: DC
  Administered 2018-05-31 – 2018-06-06 (×18): 50 mg via ORAL
  Filled 2018-05-31 (×9): qty 1
  Filled 2018-05-31: qty 2
  Filled 2018-05-31 (×8): qty 1

## 2018-05-31 MED ORDER — POTASSIUM CHLORIDE 10 MEQ/100ML IV SOLN
10.0000 meq | Freq: Once | INTRAVENOUS | Status: AC
  Administered 2018-05-31: 10 meq via INTRAVENOUS
  Filled 2018-05-31: qty 100

## 2018-05-31 MED ORDER — CLONAZEPAM 0.5 MG PO TABS: 0.5000 mg | ORAL_TABLET | Freq: Two times a day (BID) | ORAL | Status: DC | PRN

## 2018-05-31 MED ORDER — POTASSIUM CHLORIDE CRYS ER 20 MEQ PO TBCR
80.0000 meq | EXTENDED_RELEASE_TABLET | Freq: Once | ORAL | Status: AC
  Administered 2018-05-31: 80 meq via ORAL
  Filled 2018-05-31: qty 4

## 2018-05-31 MED ORDER — LABETALOL HCL 200 MG PO TABS
200.0000 mg | ORAL_TABLET | Freq: Three times a day (TID) | ORAL | Status: DC
  Administered 2018-05-31 – 2018-06-06 (×19): 200 mg via ORAL
  Filled 2018-05-31 (×19): qty 1

## 2018-05-31 MED ORDER — SODIUM CHLORIDE 0.9 % IV SOLN
INTRAVENOUS | Status: DC
  Administered 2018-05-31 – 2018-06-02 (×3): via INTRAVENOUS

## 2018-05-31 MED ORDER — ASPIRIN EC 81 MG PO TBEC
81.0000 mg | DELAYED_RELEASE_TABLET | Freq: Every day | ORAL | Status: DC
  Administered 2018-05-31 – 2018-06-06 (×7): 81 mg via ORAL
  Filled 2018-05-31 (×8): qty 1

## 2018-05-31 NOTE — H&P (Addendum)
History and Physical  Brayden Brodhead XHB:[id number] DOB: 05-24-1956 DOA: 05/31/2018  Referring physician:  Merrily Pew, MD PCP: Binnie Rail, MD  Outpatient Specialists:  Patient coming from: Home & is able to ambulate   Chief Complaint: Renal failure  HPI: [name] is a 62 y.o. male with medical history significant for multiple medical problems including hypertension, diastolic heart failure chronic kidney disease stage III, dyslipidemia erectile dysfunction gout who presented to the emergency room due to worsening renal failure a couple of weeks ago he started to have lower extremity swelling and his nephrologist added metolazone for 9 days and he stated that his renal function got worse so he was sent to the emergency department for evaluation he denies any shortness of breath chest pain no headache lower extremity swelling has resolved.  He still making urine.  ED Course: Patient was given a bolus of lactated Ringer solution 500 mils.  Also potassium chloride was given  Review of Systems:  Pt complains of no complain essentially  Pt denies any headaches chest pain diarrhea abdominal pain lightheadedness.  Review of systems are otherwise negative   Past Medical History:  Diagnosis Date  . ANEMIA   . Anxiety   . CHRONIC KIDNEY DISEASE STAGE III (MODERATE)   . Chronic systolic heart failure (HCC)    NICM - LVEF 30% echo 12/2008; improved to 50% on echo 03/2012  . DYSLIPIDEMIA   . ERECTILE DYSFUNCTION, ORGANIC   . Gout    1st attack 04/2015, diet changes to treat same  . Hyperparathyroidism, secondary ([place])   . HYPERTENSION   . OSA (obstructive sleep apnea)    mild, CPAP not recommended 08/2012   Past Surgical History:  Procedure Laterality Date  . VASECTOMY      Social History:  reports that he has never smoked. He does not have any smokeless tobacco history on file. He reports that he does not drink alcohol or use drugs.   Allergies  Allergen Reactions  .  Amlodipine Swelling  . Metolazone Other (See Comments)    Made kidney function worse    Family History  Problem Relation Age of Onset  . Hypertension Mother   . Hypertension Father   . COPD Father   . Coronary artery disease Father   . Coronary artery disease Brother 25       sudden death with MI      Prior to Admission medications   Medication Sig Start Date End Date Taking? Authorizing Provider  allopurinol (ZYLOPRIM) 100 MG tablet Take 200 mg by mouth daily.   Yes [provider]  aspirin EC 81 MG tablet Take 81 mg by mouth daily.   Yes [provider]  calcitRIOL (ROCALTROL) 0.5 MCG capsule Take 1 capsule (0.5 mcg total) by mouth daily. 05/16/15  Yes Rowe Clack, MD  cloNIDine (CATAPRES) 0.3 MG tablet TAKE 1 TABLET(0.3 MG) BY MOUTH THREE TIMES DAILY Patient taking differently: Take 0.3 mg by mouth 3 (three) times daily.  10/31/15  Yes Golden Circle, FNP  cloNIDine (CATAPRES) 0.3 MG tablet Take 1 tablet (0.3 mg total) by mouth 3 (three) times daily. Must keep 05/02/16 appt before future refills 04/23/16  Yes Burns, Claudina Lick, MD  furosemide (LASIX) 80 MG tablet Take 1 tablet (80 mg total) by mouth 2 (two) times daily. Yearly physical due in may must see MD for refills 06/27/16  Yes Burns, Claudina Lick, MD  hydrALAZINE (APRESOLINE) 50 MG tablet Take 1 tablet (50 mg total)  by mouth 3 (three) times daily. 10/31/15  Yes Burns, Claudina Lick, MD  labetalol (NORMODYNE) 200 MG tablet Take 1 tablet (200 mg total) by mouth 3 (three) times daily. --patient has scheduled may/2017 appt with new pcp-dr burns 07/06/15  Yes Burns, Claudina Lick, MD  potassium chloride SA (KLOR-CON M20) 20 MEQ tablet Take 1 tablet (20 mEq total) by mouth daily. 03/13/12  Yes Rowe Clack, MD  amLODipine (NORVASC) 5 MG tablet Take 1 tablet (5 mg total) by mouth daily. Patient not taking: Reported on 05/31/2018 10/31/15   Binnie Rail, MD  clonazePAM (KLONOPIN) 0.5 MG tablet Take 1 tablet (0.5 mg total)  by mouth 2 (two) times daily as needed for anxiety. Patient not taking: Reported on 05/31/2018 11/03/14   Golden Circle, FNP  loratadine (CLARITIN) 10 MG tablet Take 1 tablet (10 mg total) by mouth daily as needed for allergies. Patient not taking: Reported on 05/31/2018 09/07/13   Rowe Clack, MD  Omega-3 Fatty Acids (FISH OIL) 1200 MG CPDR Take 1 capsule by mouth 2 (two) times daily. Patient not taking: Reported on 05/31/2018 09/07/13   Rowe Clack, MD  sildenafil (VIAGRA) 100 MG tablet Take 1 tablet (100 mg total) by mouth as needed for erectile dysfunction. Patient not taking: Reported on 05/31/2018 03/17/15   Rowe Clack, MD  telmisartan (MICARDIS) 80 MG tablet Take 1 tablet (80 mg total) by mouth daily. Patient not taking: Reported on 05/31/2018 08/04/14   Rowe Clack, MD    Physical Exam: BP (!) 150/98   Pulse 69   Temp 98.3 F (36.8 C) (Oral)   Resp 11   Ht 6\' 2"  (1.88 m)   Wt 79.4 kg   SpO2 96%   BMI 22.47 kg/m   Exam:  . General: 62 y.o. year-old male well developed well nourished in no acute distress.  Alert and oriented x3. . Cardiovascular: Regular rate and rhythm with no rubs or gallops.  No thyromegaly or JVD noted.   Marland Kitchen Respiratory: Clear to auscultation with no wheezes or rales. Good inspiratory effort. . Abdomen: Soft nontender nondistended with normal bowel sounds x4 quadrants. . Musculoskeletal: No lower extremity edema. 2/4 pulses in all 4 extremities. . Skin: No ulcerative lesions noted or rashes, . Psychiatry: Mood is appropriate for condition and setting           Labs on Admission:  Basic Metabolic Panel: Recent Labs  Lab 05/31/18 0043  NA 136  K 3.0*  CL 81*  CO2 32  GLUCOSE 127*  BUN 205*  CREATININE 11.08*  CALCIUM 10.6*   Liver Function Tests: No results for input(s): AST, ALT, ALKPHOS, BILITOT, PROT, ALBUMIN in the last 168 hours. No results for input(s): LIPASE, AMYLASE in the last 168 hours. No results  for input(s): AMMONIA in the last 168 hours. CBC: Recent Labs  Lab 05/31/18 0043  WBC 6.5  HGB 10.2*  HCT 31.2*  MCV 86.9  PLT 154   Cardiac Enzymes: No results for input(s): CKTOTAL, CKMB, CKMBINDEX, TROPONINI in the last 168 hours.  BNP (last 3 results) No results for input(s): BNP in the last 8760 hours.  ProBNP (last 3 results) No results for input(s): PROBNP in the last 8760 hours.  CBG: No results for input(s): GLUCAP in the last 168 hours.  Radiological Exams on Admission: US Renal  Result Date: 05/31/2018 CLINICAL DATA:  Acute renal failure.  Chronic kidney disease. EXAM: RENAL / URINARY TRACT ULTRASOUND COMPLETE COMPARISON:  12/19/2008  FINDINGS: Right Kidney: Renal measurements: 9.4 x 3.5 x 5.1 cm. = volume: 88.6. mL. No mass or hydronephrosis. Increased echogenicity. Left Kidney: Renal measurements: 10.3 x 4.6 x 4.7 cm = volume: 107.8 mL. Increased parenchymal echogenicity. Tiny cyst within the upper pole of left kidney measures 5 x 4 x 4 mm. Bladder: Appears normal for degree of bladder distention. IMPRESSION: 1. Bilateral echogenic kidneys consistent with chronic medical renal disease. 2. No hydronephrosis. Electronically Signed   By: Kerby Moors M.D.   On: 05/31/2018 07:22    EKG: No ischemia Assessment/Plan Present on Admission: . ARF (acute renal failure) ([place]) . Dyslipidemia . Chronic systolic heart failure ([place]) . CKD (chronic kidney disease) stage 4, GFR 15-29 ml/min (HCC) . OSA (obstructive sleep apnea) . Acute on chronic renal failure (HCC)  Principal Problem:   ARF (acute renal failure) (HCC) Active Problems:   Dyslipidemia   Chronic systolic heart failure (HCC)   CKD (chronic kidney disease) stage 4, GFR 15-29 ml/min (HCC)   OSA (obstructive sleep apnea)   Acute on chronic renal failure (HCC)  1.  Acute on chronic renal failure secondary to metolazone and dehydration patient will be rehydrated with normal saline.  I have held his ARB which is  the Micardis also his Lasix is on hold I have discontinued the metolazone.  Renal ultrasound has been ordered  2.  Hypokalemia we will replace his potassium  3.  Diastolic heart failure is stable and well compensated as a result he would not be needing his diuretic at this time  4.  Hypertension uncontrolled his ARB is on hold we will continue hydralazine amlodipine and clonidine  5.  Hypercalcemia discontinue Ro Calcitrol  6.  History of gout continue allopurinol    Severity of Illness: The appropriate patient status for this patient is INPATIENT. Inpatient status is judged to be reasonable and necessary in order to provide the required intensity of service to ensure the patient's safety. The patient's presenting symptoms, physical exam findings, and initial radiographic and laboratory data in the context of their chronic comorbidities is felt to place them at high risk for further clinical deterioration. Furthermore, it is not anticipated that the patient will be medically stable for discharge from the hospital within 2 midnights of admission. The following factors support the patient status of inpatient.   " The patient's presenting symptoms include acute renal failure. " The worrisome physical exam findings include elevated BUN and creatinine. " The initial radiographic and laboratory data are worrisome because of elevated BUN and creatinine. " The chronic co-morbidities include chronic renal failure.    * I certify that at the point of admission it is my clinical judgment that the patient will require inpatient hospital care spanning beyond 2 midnights from the point of admission due to high intensity of service, high risk for further deterioration and high frequency of surveillance required.*    DVT prophylaxis: Heparin  Code Status: full  Family Communication: Daughter and son at bedside Disposition Plan: home  Consults called: Nephrology , DR Justin Mend by  Dr. Dayna Barker  Admission  status: Inpatient    Cristal Deer MD [facility] Pager [phone]  If 7PM-7AM, please contact night-coverage [email or web address] Password [email or web address]  05/31/2018, 8:15 AM

## 2018-05-31 NOTE — ED Notes (Signed)
Discussed plan of care with Dr Kyung Bacca, attending regards to Pt receiving IV potassium. Made aware of Pt's current BUN.  Per Dr Kyung Bacca, instructed to give iv potassium due to current potassium level of 3.0.

## 2018-05-31 NOTE — ED Notes (Signed)
The pt is here from [place] recent renal failure  labd abnormal  Caused by a med he was given.  He has no fistula yet  He urinates normally  .  Alert oriented skin warm and dry  No distress

## 2018-05-31 NOTE — ED Notes (Signed)
Patient came up to me asking for the Nurse in charge of the entire hospital. I told them I did not know that information but asked them to tell me what was going on and I would go to charge about the situation. Patient and family got rude with me stating that they have talked to numerous people and no one has done anything. Patient then told me not to talk to them and wouldn't give me any information. I reported that to the charge Nurse.

## 2018-05-31 NOTE — ED Provider Notes (Addendum)
Emergency Department Provider Note   I have reviewed the triage vital signs and the nursing notes.   HISTORY  Chief Complaint Renal Failure   HPI [name] is a 62 y.o. male with multiple medical problems as documented below the presents to the emergency department today secondary to worsening renal failure.  Patient has baseline chronic kidney disease and it sounds like a couple weeks ago he had some lower extremity edema so they added on metolazone for 9 days and he came back and was in acute renal failure so sent here for further evaluation.  Patient is asymptomatic.  Patient still making urine.  He had discussed with Dr. Justin Mend with nephrology prior to arrival. No other associated or modifying symptoms.    Past Medical History:  Diagnosis Date  . ANEMIA   . Anxiety   . CHRONIC KIDNEY DISEASE STAGE III (MODERATE)   . Chronic systolic heart failure (HCC)    NICM - LVEF 30% echo 12/2008; improved to 50% on echo 03/2012  . DYSLIPIDEMIA   . ERECTILE DYSFUNCTION, ORGANIC   . Gout    1st attack 04/2015, diet changes to treat same  . Hyperparathyroidism, secondary ([place])   . HYPERTENSION   . OSA (obstructive sleep apnea)    mild, CPAP not recommended 08/2012    Patient Active Problem List   Diagnosis Date Noted  . ARF (acute renal failure) ([place]) 05/31/2018  . Gout   . Allergic rhinitis   . Sleep disturbance, unspecified   . OSA (obstructive sleep apnea) 09/05/2012  . Anxiety   . CRF (chronic renal failure) 07/31/2011  . ERECTILE DYSFUNCTION, ORGANIC 04/26/2009  . HYPERPARATHYROIDISM, HX OF 02/12/2009  . Dyslipidemia 01/25/2009  . CKD (chronic kidney disease) stage 4, GFR 15-29 ml/min (HCC)   . Essential hypertension 01/24/2009  . ANEMIA 12/26/2008  . Chronic systolic heart failure ([place]) 12/25/2008    Past Surgical History:  Procedure Laterality Date  . VASECTOMY      Current Outpatient Rx  . Order #: [id number] Class: Historical Med  . Order #: [id number] Class:  Historical Med  . Order #: [id number] Class: Historical Med  . Order #: [id number] Class: Normal  . Order #: [id number] Class: Normal  . Order #: [id number] Class: Normal  . Order #: [id number] Class: No Print  . Order #: [id number] Class: Normal  . Order #: [id number] Class: Normal  . Order #: [id number] Class: Normal  . Order #: [id number] Class: Print  . Order #: [id number] Class: Normal  . Order #: [id number] Class: OTC  . Order #: [id number] Class: Normal  . Order #: [id number] Class: Normal    Allergies Amlodipine and Metolazone  Family History  Problem Relation Age of Onset  . Hypertension Mother   . Hypertension Father   . COPD Father   . Coronary artery disease Father   . Coronary artery disease Brother 55       sudden death with MI    Social History Social History   Tobacco Use  . Smoking status: Never Smoker  Substance Use Topics  . Alcohol use: No    Alcohol/week: 0.0 standard drinks  . Drug use: No    Review of Systems  All other systems negative except as documented in the HPI. All pertinent positives and negatives as reviewed in the HPI. ____________________________________________   PHYSICAL EXAM:  VITAL SIGNS: ED Triage Vitals  Enc Vitals Group     BP 05/31/18 0030 129/84     Pulse Rate 05/31/18 0030 67     Resp 05/31/18 0030 18  Temp 05/31/18 0030 98.3 F (36.8 C)     Temp Source 05/31/18 0030 Oral     SpO2 05/31/18 0030 98 %     Weight 05/31/18 0034 175 lb (79.4 kg)     Height 05/31/18 0034 6\' 2"  (1.88 m)    Constitutional: Alert and oriented. Well appearing and in no acute distress. Eyes: Conjunctivae are normal. PERRL. EOMI. Head: Atraumatic. Nose: No congestion/rhinnorhea. Mouth/Throat: Mucous membranes are moist.  Oropharynx non-erythematous. Neck: No stridor.  No meningeal signs.   Cardiovascular: Normal rate, regular rhythm. Good peripheral circulation. Grossly normal heart sounds.   Respiratory: Normal respiratory effort.  No retractions. Lungs  CTAB. Gastrointestinal: Soft and nontender. No distention.  Musculoskeletal: No lower extremity tenderness nor edema. No gross deformities of extremities. Neurologic:  Normal speech and language. No gross focal neurologic deficits are appreciated.  Skin:  Skin is warm, dry and intact. No rash noted.   ____________________________________________   LABS (all labs ordered are listed, but only abnormal results are displayed)  Labs Reviewed  CBC - Abnormal; Notable for the following components:      Result Value   RBC 3.59 (*)    Hemoglobin 10.2 (*)    HCT 31.2 (*)    All other components within normal limits  BASIC METABOLIC PANEL - Abnormal; Notable for the following components:   Potassium 3.0 (*)    Chloride 81 (*)    Glucose, Bld 127 (*)    BUN 205 (*)    Creatinine, Ser 11.08 (*)    Calcium 10.6 (*)    GFR calc non Af Amer 4 (*)    GFR calc Af Amer 5 (*)    Anion gap 23 (*)    All other components within normal limits  URINALYSIS, ROUTINE W REFLEX MICROSCOPIC  MAGNESIUM  PHOSPHORUS   ____________________________________________  EKG   EKG Interpretation  Date/Time:  Saturday May 31 2018 05:55:36 EST Ventricular Rate:  65 PR Interval:    QRS Duration: 116 QT Interval:  448 QTC Calculation: 466 R Axis:   -72 Text Interpretation:  Sinus rhythm Prolonged PR interval Left anterior fascicular block Probable left ventricular hypertrophy Borderline T abnormalities, lateral leads Baseline wander in lead(s) V3 Confirmed by Merrily Pew [phone]) on 05/31/2018 9:30:46 PM       ____________________________________________  RADIOLOGY  US Renal  Result Date: 05/31/2018 CLINICAL DATA:  Acute renal failure.  Chronic kidney disease. EXAM: RENAL / URINARY TRACT ULTRASOUND COMPLETE COMPARISON:  12/19/2008 FINDINGS: Right Kidney: Renal measurements: 9.4 x 3.5 x 5.1 cm. = volume: 88.6. mL. No mass or hydronephrosis. Increased echogenicity. Left Kidney: Renal measurements:  10.3 x 4.6 x 4.7 cm = volume: 107.8 mL. Increased parenchymal echogenicity. Tiny cyst within the upper pole of left kidney measures 5 x 4 x 4 mm. Bladder: Appears normal for degree of bladder distention. IMPRESSION: 1. Bilateral echogenic kidneys consistent with chronic medical renal disease. 2. No hydronephrosis. Electronically Signed   By: Kerby Moors M.D.   On: 05/31/2018 07:22    ____________________________________________   PROCEDURES  Procedure(s) performed:   Procedures  CRITICAL CARE Performed by: Merrily Pew Total critical care time: 35 minutes Critical care time was exclusive of separately billable procedures and treating other patients. Critical care was necessary to treat or prevent imminent or life-threatening deterioration. Critical care was time spent personally by me on the following activities: development of treatment plan with patient and/or surrogate as well as nursing, discussions with consultants, evaluation of patient's response to treatment,  examination of patient, obtaining history from patient or surrogate, ordering and performing treatments and interventions, ordering and review of laboratory studies, ordering and review of radiographic studies, pulse oximetry and re-evaluation of patient's condition.  ____________________________________________   INITIAL IMPRESSION / ASSESSMENT AND PLAN / ED COURSE  AoCKD likely 2/2 overdiuresis with Metolazone and Lasix concurrently. Discussed with DR. Justin Mend who wil see in AM. Suggests small bolus, electrolyte replacement and hospitalist admission in mean time.  Will add on ECG.      Pertinent labs & imaging results that were available during my care of the patient were reviewed by me and considered in my medical decision making (see chart for details).  ____________________________________________  FINAL CLINICAL IMPRESSION(S) / ED DIAGNOSES  Final diagnoses:  Acute renal failure, unspecified acute renal failure  type ([place])     MEDICATIONS GIVEN DURING THIS VISIT:  Medications  lactated ringers bolus 500 mL (has no administration in time range)  potassium chloride SA (K-DUR,KLOR-CON) CR tablet 80 mEq (has no administration in time range)  potassium chloride 10 mEq in 100 mL IVPB (has no administration in time range)     NEW OUTPATIENT MEDICATIONS STARTED DURING THIS VISIT:  New Prescriptions   No medications on file    Note:  This note was prepared with assistance of Dragon voice recognition software. Occasional wrong-word or sound-a-like substitutions may have occurred due to the inherent limitations of voice recognition software.   Merrily Pew, MD 05/31/18 2131    Merrily Pew, MD 06/23/18 [phone]

## 2018-05-31 NOTE — Consult Note (Signed)
Referring Provider: No ref. provider found Primary Care Physician:  Binnie Rail, MD Primary Nephrologist:  Dr. Jimmy Footman  Reason for Consultation: Azotemia with progressive renal insufficiency hyperlipidemia congestive heart failure with diastolic dysfunction, volume overload,  HPI: This is a very pleasant 62 year old gentleman that was asked to come to the emergency room after it was found that he had some abnormal labs at [facility].  He is also been complaining of some nausea some loss of appetite general malaise.  It was found that his BUN was 205 creatinine 11.0 potassium was low at 3.0 bicarbonate was increased at 32 his hemoglobin was stable at 10.2 his urinalysis was bland.  His renal ultrasound revealed 9.4 cm kidney on the right and a 10.3 cm kidney on the left he had a tiny cyst on the left kidney.  He had bilateral echogenic kidneys.  His creatinine in November 2016 was 2.28.  It appears that he has been refusing dialysis or preparation for dialysis.  Is only now that he presents to the emergency room with abnormal labs after having been seen by the PA in the clinic.  His home medications include Lasix 80 mg twice daily, as well as Micardis 80 mg daily.  Blood pressure 112/72 pulse 78 temperature 98.4 O2 sats 98% on room air  Sodium 136 potassium 3.0 chloride 81 CO2 32 glucose 127 BUN 205 creatinine 11.08 calcium 10.6 phosphorus 9.1 magnesium 2.8 WBC 6.5 hemoglobin 10.2 platelets 154.  Urinalysis bland renal ultrasound no evidence of any hydronephrosis  Past Medical History:  Diagnosis Date  . ANEMIA   . Anxiety   . CHRONIC KIDNEY DISEASE STAGE III (MODERATE)   . Chronic systolic heart failure (HCC)    NICM - LVEF 30% echo 12/2008; improved to 50% on echo 03/2012  . DYSLIPIDEMIA   . ERECTILE DYSFUNCTION, ORGANIC   . Gout    1st attack 04/2015, diet changes to treat same  . Hyperparathyroidism, secondary ([place])   . HYPERTENSION   . OSA (obstructive sleep apnea)     mild, CPAP not recommended 08/2012    Past Surgical History:  Procedure Laterality Date  . VASECTOMY      Prior to Admission medications   Medication Sig Start Date End Date Taking? Authorizing Provider  allopurinol (ZYLOPRIM) 100 MG tablet Take 200 mg by mouth daily.   Yes [provider]  aspirin EC 81 MG tablet Take 81 mg by mouth daily.   Yes [provider]  calcitRIOL (ROCALTROL) 0.5 MCG capsule Take 1 capsule (0.5 mcg total) by mouth daily. 05/16/15  Yes Rowe Clack, MD  cloNIDine (CATAPRES) 0.3 MG tablet TAKE 1 TABLET(0.3 MG) BY MOUTH THREE TIMES DAILY Patient taking differently: Take 0.3 mg by mouth 3 (three) times daily.  10/31/15  Yes Golden Circle, FNP  cloNIDine (CATAPRES) 0.3 MG tablet Take 1 tablet (0.3 mg total) by mouth 3 (three) times daily. Must keep 05/02/16 appt before future refills 04/23/16  Yes Burns, Claudina Lick, MD  furosemide (LASIX) 80 MG tablet Take 1 tablet (80 mg total) by mouth 2 (two) times daily. Yearly physical due in may must see MD for refills 06/27/16  Yes Burns, Claudina Lick, MD  hydrALAZINE (APRESOLINE) 50 MG tablet Take 1 tablet (50 mg total) by mouth 3 (three) times daily. 10/31/15  Yes Burns, Claudina Lick, MD  labetalol (NORMODYNE) 200 MG tablet Take 1 tablet (200 mg total) by mouth 3 (three) times daily. --patient has scheduled may/2017 appt with new  pcp-dr burns 07/06/15  Yes Burns, Claudina Lick, MD  potassium chloride SA (KLOR-CON M20) 20 MEQ tablet Take 1 tablet (20 mEq total) by mouth daily. 03/13/12  Yes Rowe Clack, MD  amLODipine (NORVASC) 5 MG tablet Take 1 tablet (5 mg total) by mouth daily. Patient not taking: Reported on 05/31/2018 10/31/15   Binnie Rail, MD  clonazePAM (KLONOPIN) 0.5 MG tablet Take 1 tablet (0.5 mg total) by mouth 2 (two) times daily as needed for anxiety. Patient not taking: Reported on 05/31/2018 11/03/14   Golden Circle, FNP  loratadine (CLARITIN) 10 MG tablet Take 1 tablet (10 mg total) by mouth  daily as needed for allergies. Patient not taking: Reported on 05/31/2018 09/07/13   Rowe Clack, MD  Omega-3 Fatty Acids (FISH OIL) 1200 MG CPDR Take 1 capsule by mouth 2 (two) times daily. Patient not taking: Reported on 05/31/2018 09/07/13   Rowe Clack, MD  sildenafil (VIAGRA) 100 MG tablet Take 1 tablet (100 mg total) by mouth as needed for erectile dysfunction. Patient not taking: Reported on 05/31/2018 03/17/15   Rowe Clack, MD  telmisartan (MICARDIS) 80 MG tablet Take 1 tablet (80 mg total) by mouth daily. Patient not taking: Reported on 05/31/2018 08/04/14   Rowe Clack, MD    Current Facility-Administered Medications  Medication Dose Route Frequency Provider Last Rate Last Dose  . 0.9 %  sodium chloride infusion   Intravenous Continuous Cristal Deer, MD 75 mL/hr at 05/31/18 1103    . amLODipine (NORVASC) tablet 5 mg  5 mg Oral Daily Cristal Deer, MD      . aspirin EC tablet 81 mg  81 mg Oral Daily Cristal Deer, MD   81 mg at 05/31/18 1059  . clonazePAM (KLONOPIN) tablet 0.5 mg  0.5 mg Oral BID PRN Cristal Deer, MD      . cloNIDine (CATAPRES) tablet 0.3 mg  0.3 mg Oral TID Cristal Deer, MD   0.3 mg at 05/31/18 1059  . Fish Oil CPDR 1 capsule  1 capsule Oral BID Cristal Deer, MD      . heparin injection 5,000 Units  5,000 Units Subcutaneous Q8H Cristal Deer, MD      . hydrALAZINE (APRESOLINE) tablet 50 mg  50 mg Oral TID Cristal Deer, MD   50 mg at 05/31/18 1100  . labetalol (NORMODYNE) tablet 200 mg  200 mg Oral TID Cristal Deer, MD   200 mg at 05/31/18 1100   Current Outpatient Medications  Medication Sig Dispense Refill  . allopurinol (ZYLOPRIM) 100 MG tablet Take 200 mg by mouth daily.    Marland Kitchen aspirin EC 81 MG tablet Take 81 mg by mouth daily.    . calcitRIOL (ROCALTROL) 0.5 MCG capsule Take 1 capsule (0.5 mcg total) by mouth daily.  6  . cloNIDine (CATAPRES) 0.3 MG tablet TAKE 1 TABLET(0.3 MG) BY MOUTH THREE TIMES  DAILY (Patient taking differently: Take 0.3 mg by mouth 3 (three) times daily. ) 270 tablet 2  . cloNIDine (CATAPRES) 0.3 MG tablet Take 1 tablet (0.3 mg total) by mouth 3 (three) times daily. Must keep 05/02/16 appt before future refills 90 tablet 0  . furosemide (LASIX) 80 MG tablet Take 1 tablet (80 mg total) by mouth 2 (two) times daily. Yearly physical due in may must see MD for refills 180 tablet 0  . hydrALAZINE (APRESOLINE) 50 MG tablet Take 1 tablet (50 mg total) by mouth 3 (three) times daily. 90 tablet 5  . labetalol (  NORMODYNE) 200 MG tablet Take 1 tablet (200 mg total) by mouth 3 (three) times daily. --patient has scheduled may/2017 appt with new pcp-dr burns 540 tablet 1  . potassium chloride SA (KLOR-CON M20) 20 MEQ tablet Take 1 tablet (20 mEq total) by mouth daily. 30 tablet 11  . amLODipine (NORVASC) 5 MG tablet Take 1 tablet (5 mg total) by mouth daily. (Patient not taking: Reported on 05/31/2018) 90 tablet 3  . clonazePAM (KLONOPIN) 0.5 MG tablet Take 1 tablet (0.5 mg total) by mouth 2 (two) times daily as needed for anxiety. (Patient not taking: Reported on 05/31/2018) 20 tablet 0  . loratadine (CLARITIN) 10 MG tablet Take 1 tablet (10 mg total) by mouth daily as needed for allergies. (Patient not taking: Reported on 05/31/2018) 30 tablet 2  . Omega-3 Fatty Acids (FISH OIL) 1200 MG CPDR Take 1 capsule by mouth 2 (two) times daily. (Patient not taking: Reported on 05/31/2018) 60 capsule   . sildenafil (VIAGRA) 100 MG tablet Take 1 tablet (100 mg total) by mouth as needed for erectile dysfunction. (Patient not taking: Reported on 05/31/2018) 10 tablet 0  . telmisartan (MICARDIS) 80 MG tablet Take 1 tablet (80 mg total) by mouth daily. (Patient not taking: Reported on 05/31/2018) 30 tablet 5    Allergies as of 05/31/2018 - Review Complete 05/31/2018  Allergen Reaction Noted  . Amlodipine Swelling 05/31/2018  . Metolazone Other (See Comments) 05/31/2018    Family History   Problem Relation Age of Onset  . Hypertension Mother   . Hypertension Father   . COPD Father   . Coronary artery disease Father   . Coronary artery disease Brother 54       sudden death with MI    Social History   Socioeconomic History  . Marital status: Legally Separated    Spouse name: Not on file  . Number of children: Not on file  . Years of education: Not on file  . Highest education level: Not on file  Occupational History  . Not on file  Social Needs  . Financial resource strain: Not on file  . Food insecurity:    Worry: Not on file    Inability: Not on file  . Transportation needs:    Medical: Not on file    Non-medical: Not on file  Tobacco Use  . Smoking status: Never Smoker  Substance and Sexual Activity  . Alcohol use: No    Alcohol/week: 0.0 standard drinks  . Drug use: No  . Sexual activity: Not on file  Lifestyle  . Physical activity:    Days per week: Not on file    Minutes per session: Not on file  . Stress: Not on file  Relationships  . Social connections:    Talks on phone: Not on file    Gets together: Not on file    Attends religious service: Not on file    Active member of club or organization: Not on file    Attends meetings of clubs or organizations: Not on file    Relationship status: Not on file  . Intimate partner violence:    Fear of current or ex partner: Not on file    Emotionally abused: Not on file    Physically abused: Not on file    Forced sexual activity: Not on file  Other Topics Concern  . Not on file  Social History Narrative   Lives alone, divorced x's 3    Review of Systems: Gen: No  fever sweats chills admits to fatigue weakness and malaise HEENT: No visual complaints, No history of Retinopathy. Normal external appearance No Epistaxis or Sore throat. No sinusitis.   CV: Denies chest pain, angina, palpitations, syncope, orthopnea, PND, peripheral edema, and claudication. Resp: Denies dyspnea at rest, dyspnea with  exercise, cough, sputum, wheezing, coughing up blood, and pleurisy. GI: Denies vomiting blood, jaundice, and fecal incontinence.   Denies dysphagia or odynophagia. GU : Denies urinary burning, blood in urine, urinary frequency, urinary hesitancy, nocturnal urination, and urinary incontinence.  No renal calculi. MS: Denies joint pain, limitation of movement, and swelling, stiffness, low back pain, extremity pain. Denies muscle weakness, cramps, atrophy.  No use of non steroidal antiinflammatory drugs. Derm: Denies rash, itching, dry skin, hives, moles, warts, or unhealing ulcers.  Psych: Denies depression, anxiety, memory loss, suicidal ideation, hallucinations, paranoia, and confusion. Heme: Denies bruising, bleeding, and enlarged lymph nodes. Neuro: No headache.  No diplopia. No dysarthria.  No dysphasia.  No history of CVA.  No Seizures. No paresthesias.  No weakness. Endocrine No DM.  No Thyroid disease.  No Adrenal disease.  Physical Exam: Vital signs in last 24 hours: Temp:  [98.3 F (36.8 C)-98.4 F (36.9 C)] 98.4 F (36.9 C) (12/14 1304) Pulse Rate:  [67-157] 78 (12/14 1304) Resp:  [9-25] 18 (12/14 1304) BP: (112-158)/(73-109) 112/73 (12/14 1304) SpO2:  [88 %-100 %] 96 % (12/14 1304) Weight:  [79.4 kg] 79.4 kg (12/14 0034)   General:   Alert,  Well-developed, well-nourished, pleasant and cooperative in NAD Head:  Normocephalic and atraumatic. Eyes:  Sclera clear, no icterus.   Conjunctiva pink. Ears:  Normal auditory acuity. Nose:  No deformity, discharge,  or lesions. Mouth:  No deformity or lesions, dentition normal. Neck:  Supple; no masses or thyromegaly. JVP not elevated Lungs:  Clear throughout to auscultation.   No wheezes, crackles, or rhonchi. No acute distress. Heart:  Regular rate and rhythm; no murmurs, clicks, rubs,  or gallops. Abdomen:  Soft, nontender and nondistended. No masses, hepatosplenomegaly or hernias noted. Normal bowel sounds, without guarding, and  without rebound.   Msk:  Symmetrical without gross deformities. Normal posture. Pulses:  No carotid, renal, femoral bruits. DP and PT symmetrical and equal Extremities:  Without clubbing or edema. Neurologic:  Alert and  oriented x4;  grossly normal neurologically. Skin:  Intact without significant lesions or rashes.   Intake/Output from previous day: No intake/output data recorded. Intake/Output this shift: No intake/output data recorded.  Lab Results: Recent Labs    05/31/18 0043  WBC 6.5  HGB 10.2*  HCT 31.2*  PLT 154   BMET Recent Labs    05/31/18 0043  NA 136  K 3.0*  CL 81*  CO2 32  GLUCOSE 127*  BUN 205*  CREATININE 11.08*  CALCIUM 10.6*  PHOS 9.1*   LFT No results for input(s): PROT, ALBUMIN, AST, ALT, ALKPHOS, BILITOT, BILIDIR, IBILI in the last 72 hours. PT/INR No results for input(s): LABPROT, INR in the last 72 hours. Hepatitis Panel No results for input(s): HEPBSAG, HCVAB, HEPAIGM, HEPBIGM in the last 72 hours.  Studies/Results: US Renal  Result Date: 05/31/2018 CLINICAL DATA:  Acute renal failure.  Chronic kidney disease. EXAM: RENAL / URINARY TRACT ULTRASOUND COMPLETE COMPARISON:  12/19/2008 FINDINGS: Right Kidney: Renal measurements: 9.4 x 3.5 x 5.1 cm. = volume: 88.6. mL. No mass or hydronephrosis. Increased echogenicity. Left Kidney: Renal measurements: 10.3 x 4.6 x 4.7 cm = volume: 107.8 mL. Increased parenchymal echogenicity. Tiny cyst within the  upper pole of left kidney measures 5 x 4 x 4 mm. Bladder: Appears normal for degree of bladder distention. IMPRESSION: 1. Bilateral echogenic kidneys consistent with chronic medical renal disease. 2. No hydronephrosis. Electronically Signed   By: Kerby Moors M.D.   On: 05/31/2018 07:22    Assessment/Plan:  Chronic renal insufficiency possibly acute on chronic with the use of ARB and diuretic volume depletion.  Agree with discontinuation of the ARB Micardis no ACE inhibitors with hold Lasix at this  time.  Avoid any other nephrotoxins and IV contrast.  Urinalysis is bland renal ultrasound does not reveal any evidence of hydronephrosis although renal size is rather diminutive.  The kidneys were also echodense.  He has been refusing dialysis in the past although he states he will consider it now.  We shall see how his renal function improves although I suspect he is going to need dialysis acutely in the hospital.  We may want or initiate clip process on dialysis if his renal function fails to improve by tomorrow.  Hypertension/volume appears to be euvolemic.  Will discontinue ARB and diuretic at this time  Diastolic heart failure appears to be stable and compensated  Anemia does not appear to be an issue at this time  Hypercalcemia will discontinue his Rocaltrol  Hypo-kalemia replete potassium supplementation    LOS: 0 Sherril Croon @TODAY @1 :52 PM

## 2018-06-01 DIAGNOSIS — N184 Chronic kidney disease, stage 4 (severe): Secondary | ICD-10-CM

## 2018-06-01 DIAGNOSIS — I10 Essential (primary) hypertension: Secondary | ICD-10-CM

## 2018-06-01 DIAGNOSIS — E785 Hyperlipidemia, unspecified: Secondary | ICD-10-CM

## 2018-06-01 DIAGNOSIS — N171 Acute kidney failure with acute cortical necrosis: Secondary | ICD-10-CM

## 2018-06-01 LAB — HIV ANTIBODY (ROUTINE TESTING W REFLEX): HIV Screen 4th Generation wRfx: NONREACTIVE

## 2018-06-01 LAB — BASIC METABOLIC PANEL
ANION GAP: 19 — AB (ref 5–15)
BUN: 195 mg/dL — ABNORMAL HIGH (ref 6–20)
CO2: 31 mmol/L (ref 22–32)
Calcium: 9.8 mg/dL (ref 8.9–10.3)
Chloride: 89 mmol/L — ABNORMAL LOW (ref 98–111)
Creatinine, Ser: 10.54 mg/dL — ABNORMAL HIGH (ref 0.61–1.24)
GFR calc Af Amer: 5 mL/min — ABNORMAL LOW (ref >60)
GFR calc non Af Amer: 5 mL/min — ABNORMAL LOW (ref >60)
GLUCOSE: 115 mg/dL — AB (ref 70–99)
Potassium: 3 mmol/L — ABNORMAL LOW (ref 3.5–5.1)
Sodium: 139 mmol/L (ref 135–145)

## 2018-06-01 MED ORDER — POTASSIUM CHLORIDE CRYS ER 20 MEQ PO TBCR
40.0000 meq | EXTENDED_RELEASE_TABLET | Freq: Once | ORAL | Status: AC
  Administered 2018-06-01: 40 meq via ORAL
  Filled 2018-06-01: qty 2

## 2018-06-01 MED ORDER — RAMELTEON 8 MG PO TABS
8.0000 mg | ORAL_TABLET | Freq: Every day | ORAL | Status: DC
  Administered 2018-06-01 – 2018-06-05 (×5): 8 mg via ORAL
  Filled 2018-06-01 (×5): qty 1

## 2018-06-01 NOTE — Progress Notes (Signed)
PROGRESS NOTE  [name] EFE:[id number] DOB: 05/18/1956 DOA: 05/31/2018 PCP: Binnie Rail, MD   LOS: 1 day   Brief Narrative / Interim history: 62 year old male with history of hypertension, chronic diastolic CHF, chronic kidney disease stage III with baseline creatinine around 2 followed by Dr. Jimmy Footman, hyperlipidemia who came to the hospital with worsening renal failure.  He was seen as an outpatient few weeks ago, felt to be fluid overloaded and started on metolazone.  He took that for about 9 days, fluid status got better but his creatinine went up for which she was admitted.  Subjective: Feels a lot better this morning, no issues.  States that he does not think he needs dialysis right now but is willing to consider it only when his life is in immediate danger.  He is concerned about AV fistula or temporary cath placements due to potential for infections  Assessment & Plan: Principal Problem:   ARF (acute renal failure) (HCC) Active Problems:   Dyslipidemia   Chronic systolic heart failure (HCC)   CKD (chronic kidney disease) stage 4, GFR 15-29 ml/min (HCC)   OSA (obstructive sleep apnea)   Acute on chronic renal failure (HCC)   Principal Problem Acute kidney injury in the setting of chronic kidney disease stage III -Felt to be dehydrated on admission, his diuretics were held and he was placed on IV fluids.  Creatinine slightly better but not much improvement, discussed with Dr. Gale Journey with nephrology and for now we will continue IV fluids and continue to monitor.  If no significant improvement may need dialysis  Additional Problems Hypertension -Continue Norvasc, clonidine, hydralazine and labetalol.  Hold ARB and diuretic -Blood pressure stable today  Chronic diastolic CHF -Appears euvolemic without evidence of fluid overload.  Will place on daily weights  Anemia in the setting of chronic kidney disease -Hemoglobin stable, no evidence of  bleed  Hypokalemia -Continue to replete potassium  Scheduled Meds: . amLODipine  5 mg Oral Daily  . aspirin EC  81 mg Oral Daily  . cloNIDine  0.3 mg Oral TID  . heparin  5,000 Units Subcutaneous Q8H  . hydrALAZINE  50 mg Oral TID  . labetalol  200 mg Oral TID  . omega-3 acid ethyl esters  1 g Oral BID  . potassium chloride  40 mEq Oral Once   Continuous Infusions: . sodium chloride 75 mL/hr at 05/31/18 2357   PRN Meds:.clonazePAM  DVT prophylaxis: Heparin Code Status: Full code Family Communication: No family present at bedside Disposition Plan: Home when cleared by nephrology  Consultants:   Nephrology  Procedures:   None   Antimicrobials:  None    Objective: Vitals:   05/31/18 1304 05/31/18 1405 05/31/18 1623 06/01/18 0033  BP: 112/73 100/65 124/77 (!) 144/83  Pulse: 78 81 74 64  Resp: 18 16 15 16   Temp: 98.4 F (36.9 C) 98.1 F (36.7 C) 98.5 F (36.9 C) 97.8 F (36.6 C)  TempSrc: Oral Oral Oral Oral  SpO2: 96% 99% 95% 100%  Weight:      Height:        Intake/Output Summary (Last 24 hours) at 06/01/2018 1016 Last data filed at 06/01/2018 0851 Gross per 24 hour  Intake 602.02 ml  Output -  Net 602.02 ml   Filed Weights   05/31/18 0034  Weight: 79.4 kg    Examination:  Constitutional: NAD Eyes: PERRL, lids and conjunctivae normal ENMT: Mucous membranes are moist Neck: normal, supple Respiratory: clear to auscultation bilaterally, no  wheezing, no crackles. Normal respiratory effort. No accessory muscle use.  Cardiovascular: Regular rate and rhythm, no murmurs / rubs / gallops. No LE edema. 2+ pedal pulses.  Abdomen: no tenderness. Bowel sounds positive.  Musculoskeletal: no clubbing / cyanosis.  Skin: no rashes Neurologic: No focal deficits, ambulatory Psychiatric: Normal judgment and insight. Alert and oriented x 3. Normal mood.   Data Reviewed: I have independently reviewed following labs and imaging studies renal ultrasound images  personally reviewed, no evidence of hydronephrosis  CBC: Recent Labs  Lab 05/31/18 0043 05/31/18 1451  WBC 6.5 5.7  HGB 10.2* 9.4*  HCT 31.2* 28.2*  MCV 86.9 85.5  PLT 154 542*   Basic Metabolic Panel: Recent Labs  Lab 05/31/18 0043 05/31/18 1451 06/01/18 0603  NA 136  --  139  K 3.0*  --  3.0*  CL 81*  --  89*  CO2 32  --  31  GLUCOSE 127*  --  115*  BUN 205*  --  195*  CREATININE 11.08* 11.02* 10.54*  CALCIUM 10.6*  --  9.8  MG 2.8*  --   --   PHOS 9.1*  --   --    GFR: Estimated Creatinine Clearance: 8.4 mL/min (A) (by C-G formula based on SCr of 10.54 mg/dL (H)). Liver Function Tests: No results for input(s): AST, ALT, ALKPHOS, BILITOT, PROT, ALBUMIN in the last 168 hours. No results for input(s): LIPASE, AMYLASE in the last 168 hours. No results for input(s): AMMONIA in the last 168 hours. Coagulation Profile: No results for input(s): INR, PROTIME in the last 168 hours. Cardiac Enzymes: No results for input(s): CKTOTAL, CKMB, CKMBINDEX, TROPONINI in the last 168 hours. BNP (last 3 results) No results for input(s): PROBNP in the last 8760 hours. HbA1C: No results for input(s): HGBA1C in the last 72 hours. CBG: No results for input(s): GLUCAP in the last 168 hours. Lipid Profile: No results for input(s): CHOL, HDL, LDLCALC, TRIG, CHOLHDL, LDLDIRECT in the last 72 hours. Thyroid Function Tests: No results for input(s): TSH, T4TOTAL, FREET4, T3FREE, THYROIDAB in the last 72 hours. Anemia Panel: No results for input(s): VITAMINB12, FOLATE, FERRITIN, TIBC, IRON, RETICCTPCT in the last 72 hours. Urine analysis:    Component Value Date/Time   COLORURINE YELLOW 05/31/2018 0040   APPEARANCEUR CLEAR 05/31/2018 0040   LABSPEC 1.011 05/31/2018 0040   PHURINE 5.0 05/31/2018 0040   GLUCOSEU NEGATIVE 05/31/2018 0040   GLUCOSEU NEGATIVE 03/13/2012 0902   HGBUR NEGATIVE 05/31/2018 0040   BILIRUBINUR NEGATIVE 05/31/2018 0040   KETONESUR NEGATIVE 05/31/2018 0040    PROTEINUR NEGATIVE 05/31/2018 0040   UROBILINOGEN 0.2 03/13/2012 0902   NITRITE NEGATIVE 05/31/2018 0040   LEUKOCYTESUR NEGATIVE 05/31/2018 0040   Sepsis Labs: Invalid input(s): PROCALCITONIN, LACTICIDVEN  No results found for this or any previous visit (from the past 240 hour(s)).    Radiology Studies: US Renal  Result Date: 05/31/2018 CLINICAL DATA:  Acute renal failure.  Chronic kidney disease. EXAM: RENAL / URINARY TRACT ULTRASOUND COMPLETE COMPARISON:  12/19/2008 FINDINGS: Right Kidney: Renal measurements: 9.4 x 3.5 x 5.1 cm. = volume: 88.6. mL. No mass or hydronephrosis. Increased echogenicity. Left Kidney: Renal measurements: 10.3 x 4.6 x 4.7 cm = volume: 107.8 mL. Increased parenchymal echogenicity. Tiny cyst within the upper pole of left kidney measures 5 x 4 x 4 mm. Bladder: Appears normal for degree of bladder distention. IMPRESSION: 1. Bilateral echogenic kidneys consistent with chronic medical renal disease. 2. No hydronephrosis. Electronically Signed   By: Kerby Moors  M.D.   On: 05/31/2018 07:22      Marzetta Board, MD, PhD [facility] Pager [phone]  If 7PM-7AM, please contact night-coverage [email or web address] Password [email or web address] 06/01/2018, 10:16 AM

## 2018-06-01 NOTE — Progress Notes (Signed)
[facility] ROUNDING NOTE   Subjective:   Patient is fully clothed and sitting out of bed.  He is wearing his boots and is adamant about not starting dialysis.  He states he does not want to have an AV fistula placed at this time.  Despite educating him about the importance of placement of fistula in advance of requiring dialysis.  Blood pressure 144/83 pulse 64 temperature 97.8 O2 sats 100% room air  Sodium 139 potassium 3.0 chloride 99 CO2 31 BUN 195 creatinine 10.54 calcium 9.8.  WBC 5.7 hemoglobin 9.4 platelets 138 HIV screen nonreactive  No urine output recorded   Objective:  Vital signs in last 24 hours:  Temp:  [97.8 F (36.6 C)-98.5 F (36.9 C)] 97.8 F (36.6 C) (12/15 0033) Pulse Rate:  [64-81] 64 (12/15 0033) Resp:  [15-18] 16 (12/15 0033) BP: (100-144)/(65-83) 144/83 (12/15 0033) SpO2:  [95 %-100 %] 100 % (12/15 0033)  Weight change:  Filed Weights   05/31/18 0034  Weight: 79.4 kg    Intake/Output: I/O last 3 completed shifts: In: 362 [I.V.:362] Out: -    Intake/Output this shift:  Total I/O In: 240 [P.O.:240] Out: -  Awake alert oriented no distress CVS- RRR no murmurs rubs gallops RS- CTA no wheezes rales lung fields were clear ABD- BS present soft non-distended EXT- no edema   Basic Metabolic Panel: Recent Labs  Lab 05/31/18 0043 05/31/18 1451 06/01/18 0603  NA 136  --  139  K 3.0*  --  3.0*  CL 81*  --  89*  CO2 32  --  31  GLUCOSE 127*  --  115*  BUN 205*  --  195*  CREATININE 11.08* 11.02* 10.54*  CALCIUM 10.6*  --  9.8  MG 2.8*  --   --   PHOS 9.1*  --   --     Liver Function Tests: No results for input(s): AST, ALT, ALKPHOS, BILITOT, PROT, ALBUMIN in the last 168 hours. No results for input(s): LIPASE, AMYLASE in the last 168 hours. No results for input(s): AMMONIA in the last 168 hours.  CBC: Recent Labs  Lab 05/31/18 0043 05/31/18 1451  WBC 6.5 5.7  HGB 10.2* 9.4*  HCT 31.2* 28.2*  MCV 86.9 85.5  PLT  154 138*    Cardiac Enzymes: No results for input(s): CKTOTAL, CKMB, CKMBINDEX, TROPONINI in the last 168 hours.  BNP: Invalid input(s): POCBNP  CBG: No results for input(s): GLUCAP in the last 168 hours.  Microbiology: No results found for this or any previous visit.  Coagulation Studies: No results for input(s): LABPROT, INR in the last 72 hours.  Urinalysis: Recent Labs    05/31/18 0040  COLORURINE YELLOW  LABSPEC 1.011  PHURINE 5.0  GLUCOSEU NEGATIVE  HGBUR NEGATIVE  BILIRUBINUR NEGATIVE  KETONESUR NEGATIVE  PROTEINUR NEGATIVE  NITRITE NEGATIVE  LEUKOCYTESUR NEGATIVE      Imaging: US Renal  Result Date: 05/31/2018 CLINICAL DATA:  Acute renal failure.  Chronic kidney disease. EXAM: RENAL / URINARY TRACT ULTRASOUND COMPLETE COMPARISON:  12/19/2008 FINDINGS: Right Kidney: Renal measurements: 9.4 x 3.5 x 5.1 cm. = volume: 88.6. mL. No mass or hydronephrosis. Increased echogenicity. Left Kidney: Renal measurements: 10.3 x 4.6 x 4.7 cm = volume: 107.8 mL. Increased parenchymal echogenicity. Tiny cyst within the upper pole of left kidney measures 5 x 4 x 4 mm. Bladder: Appears normal for degree of bladder distention. IMPRESSION: 1. Bilateral echogenic kidneys consistent with chronic medical renal disease. 2. No hydronephrosis. Electronically Signed  By: Kerby Moors M.D.   On: 05/31/2018 07:22     Medications:   . sodium chloride 75 mL/hr at 05/31/18 2357   . amLODipine  5 mg Oral Daily  . aspirin EC  81 mg Oral Daily  . cloNIDine  0.3 mg Oral TID  . heparin  5,000 Units Subcutaneous Q8H  . hydrALAZINE  50 mg Oral TID  . labetalol  200 mg Oral TID  . omega-3 acid ethyl esters  1 g Oral BID  . potassium chloride  40 mEq Oral Once   [email or web address]  Assessment/ Plan:   Chronic renal insufficiency which may be acute on chronic with the use of ARB and diuretics.  Both of these have been discontinued.  His blood pressure appears to be well controlled his urinalysis  is bland his renal ultrasound does not reveal hydronephrosis there is kidneys are echodense.  He is still refusing dialysis he refuses any attempts at education and refuses placement of AV fistula.  His renal function does slowly improve.  Hypertension/volume is euvolemic no longer on ARB or diuretic.  Is continuing to rehydrate  Diastolic dysfunction appears to be stable  Anemia not issue at this time  Hypocalcemia have discontinued his Rocaltrol  Hypokalemia will replete discussed with Dr.Gherghe    LOS: 1 Sherril Croon @TODAY @10 :41 AM

## 2018-06-02 LAB — CBC
HCT: 28.7 % — ABNORMAL LOW (ref 39.0–52.0)
Hemoglobin: 9.5 g/dL — ABNORMAL LOW (ref 13.0–17.0)
MCH: 28.4 pg (ref 26.0–34.0)
MCHC: 33.1 g/dL (ref 30.0–36.0)
MCV: 85.7 fL (ref 80.0–100.0)
PLATELETS: 129 10*3/uL — AB (ref 150–400)
RBC: 3.35 MIL/uL — ABNORMAL LOW (ref 4.22–5.81)
RDW: 12.7 % (ref 11.5–15.5)
WBC: 5.6 10*3/uL (ref 4.0–10.5)
nRBC: 0 % (ref 0.0–0.2)

## 2018-06-02 LAB — BASIC METABOLIC PANEL
Anion gap: 19 — ABNORMAL HIGH (ref 5–15)
BUN: 188 mg/dL — ABNORMAL HIGH (ref 6–20)
CO2: 29 mmol/L (ref 22–32)
Calcium: 9.6 mg/dL (ref 8.9–10.3)
Chloride: 91 mmol/L — ABNORMAL LOW (ref 98–111)
Creatinine, Ser: 9.83 mg/dL — ABNORMAL HIGH (ref 0.61–1.24)
GFR calc non Af Amer: 5 mL/min — ABNORMAL LOW (ref >60)
GFR, EST AFRICAN AMERICAN: 6 mL/min — AB (ref >60)
Glucose, Bld: 116 mg/dL — ABNORMAL HIGH (ref 70–99)
Potassium: 3 mmol/L — ABNORMAL LOW (ref 3.5–5.1)
Sodium: 139 mmol/L (ref 135–145)

## 2018-06-02 MED ORDER — POTASSIUM CHLORIDE CRYS ER 20 MEQ PO TBCR
30.0000 meq | EXTENDED_RELEASE_TABLET | ORAL | Status: AC
  Administered 2018-06-02 (×2): 30 meq via ORAL
  Filled 2018-06-02 (×2): qty 1

## 2018-06-02 NOTE — Progress Notes (Signed)
Subjective:  1850 UOP but 3100 in- BUN and crt trending down but still markedly abnormal  - overall he says subjectively he is feeling better, appetite back Objective Vital signs in last 24 hours: Vitals:   06/01/18 0033 06/01/18 1653 06/01/18 2357 06/02/18 0839  BP: (!) 144/83 137/86 (!) 155/79 (!) 165/101  Pulse: 64 69 (!) 39 71  Resp: 16 15 16    Temp: 97.8 F (36.6 C) 98.4 F (36.9 C) (!) 97.5 F (36.4 C) 98 F (36.7 C)  TempSrc: Oral Oral Oral Oral  SpO2: 100% 97% 96% 99%  Weight:      Height:       Weight change:   Intake/Output Summary (Last 24 hours) at 06/02/2018 1132 Last data filed at 06/02/2018 1000 Gross per 24 hour  Intake 2915 ml  Output 2350 ml  Net 565 ml    Assessment/ Plan: Pt is a 63 y.o. yo male with advanced CKD at baseline f/b Dr. Jimmy Footman who was admitted on 05/31/2018 with  A on CRF in the setting of diuresis   Assessment/Plan: 1. Renal - A on chronic in the setting of diuresis  - baseline seems to be between  4 and 5.  Has refused to participate in any pre dialysis planning.  He says he was fine until he got that medicine (metolazone) understands he is on the edge but does not want to do dialysis unless absolutely necessary.  Will not get AVF.  Truth be told is is pending better even though far from normal and subjectively he reports improvement.  I offered him to go home and follow as an OP but he feels better if he stays here.  I think plan as OP will be more frequent labs to monitor even though I am sure he will not consent to HD until he feels badly  2. HTN/volume- do not think is dry given high BP- stop IVF - cont clonidine/hydralazine and labetalol- will need diuretic restarted at some point, not yet today  3. Anemia- check iron stores  4. Secondary hyperparathyroidism- last PTH was good as OP- first phos was 9- will check tomorrow and treat as needed  5. Hypokalemia- replete- got 60 meq today for 3.0- cont to follow  [place]: Basic Metabolic Panel: Recent Labs  Lab 05/31/18 0043 05/31/18 1451 06/01/18 0603 06/02/18 0222  NA 136  --  139 139  K 3.0*  --  3.0* 3.0*  CL 81*  --  89* 91*  CO2 32  --  31 29  GLUCOSE 127*  --  115* 116*  BUN 205*  --  195* 188*  CREATININE 11.08* 11.02* 10.54* 9.83*  CALCIUM 10.6*  --  9.8 9.6  PHOS 9.1*  --   --   --    Liver Function Tests: No results for input(s): AST, ALT, ALKPHOS, BILITOT, PROT, ALBUMIN in the last 168 hours. No results for input(s): LIPASE, AMYLASE in the last 168 hours. No results for input(s): AMMONIA in the last 168 hours. CBC: Recent Labs  Lab 05/31/18 0043 05/31/18 1451 06/02/18 0222  WBC 6.5 5.7 5.6  HGB 10.2* 9.4* 9.5*  HCT 31.2* 28.2* 28.7*  MCV 86.9 85.5 85.7  PLT 154 138* 129*   Cardiac Enzymes: No results for input(s): CKTOTAL, CKMB, CKMBINDEX, TROPONINI in the last 168 hours. CBG: No results for input(s): GLUCAP in the last 168 hours.  Iron Studies: No results for input(s): IRON, TIBC, TRANSFERRIN, FERRITIN in the  last 72 hours. Studies/Results: No results found. Medications: Infusions: . sodium chloride 75 mL/hr at 06/02/18 0321    Scheduled Medications: . aspirin EC  81 mg Oral Daily  . cloNIDine  0.3 mg Oral TID  . heparin  5,000 Units Subcutaneous Q8H  . hydrALAZINE  50 mg Oral TID  . labetalol  200 mg Oral TID  . omega-3 acid ethyl esters  1 g Oral BID  . ramelteon  8 mg Oral QHS    have reviewed scheduled and prn medications.  Physical Exam: General: NAD Heart: RRR Lungs: mostly clear Abdomen: soft, non tender Extremities: min edema    06/02/2018,11:32 AM  LOS: 2 days

## 2018-06-02 NOTE — Progress Notes (Signed)
PROGRESS NOTE  [name] NFA:[id number] DOB: 05-Dec-1955 DOA: 05/31/2018 PCP: Binnie Rail, MD   LOS: 2 days   Brief Narrative / Interim history: 62 year old male with history of hypertension, chronic diastolic CHF, chronic kidney disease stage III with baseline creatinine around 2 followed by Dr. Jimmy Footman, hyperlipidemia who came to the hospital with worsening renal failure.  He was seen as an outpatient few weeks ago, felt to be fluid overloaded and started on metolazone.  He took that for about 9 days, fluid status got better but his creatinine went up for which she was admitted.  Subjective: -No shortness of breath, no chest pain, no abdominal pain, nausea or vomiting.  Overall he feels well.  Still somewhat weak when compared to baseline but improving  Assessment & Plan: Principal Problem:   ARF (acute renal failure) (HCC) Active Problems:   Dyslipidemia   Chronic systolic heart failure (HCC)   CKD (chronic kidney disease) stage 4, GFR 15-29 ml/min (HCC)   OSA (obstructive sleep apnea)   Acute on chronic renal failure (HCC)   Principal Problem Acute kidney injury in the setting of chronic kidney disease stage III -Felt to be dehydrated on admission, his diuretics were held and he was placed on IV fluids. -Creatinine with slight improvement today, he makes good amounts of urine -Appreciate nephrology follow-up  Additional Problems Hypertension -Continue clonidine, hydralazine and labetalol.  Hold ARB and diuretic -Patient does not want to control the pain as she apparently had lower extremity swelling with it, will discontinue  Chronic diastolic CHF -Appears euvolemic without evidence of fluid overload.  Continue IV fluids per nephrology  Anemia in the setting of chronic kidney disease -Hemoglobin stable, no evidence of bleed  Hypokalemia -Continue to replete potassium again today  Scheduled Meds: . amLODipine  5 mg Oral Daily  . aspirin EC  81 mg Oral Daily  .  cloNIDine  0.3 mg Oral TID  . heparin  5,000 Units Subcutaneous Q8H  . hydrALAZINE  50 mg Oral TID  . labetalol  200 mg Oral TID  . omega-3 acid ethyl esters  1 g Oral BID  . potassium chloride  30 mEq Oral Q2H  . ramelteon  8 mg Oral QHS   Continuous Infusions: . sodium chloride 75 mL/hr at 06/02/18 0321   PRN Meds:.clonazePAM  DVT prophylaxis: Heparin Code Status: Full code Family Communication: No family present at bedside Disposition Plan: Home when cleared by nephrology  Consultants:   Nephrology  Procedures:   None   Antimicrobials:  None    Objective: Vitals:   06/01/18 0033 06/01/18 1653 06/01/18 2357 06/02/18 0839  BP: (!) 144/83 137/86 (!) 155/79 (!) 165/101  Pulse: 64 69 (!) 39 71  Resp: 16 15 16    Temp: 97.8 F (36.6 C) 98.4 F (36.9 C) (!) 97.5 F (36.4 C) 98 F (36.7 C)  TempSrc: Oral Oral Oral Oral  SpO2: 100% 97% 96% 99%  Weight:      Height:        Intake/Output Summary (Last 24 hours) at 06/02/2018 1011 Last data filed at 06/02/2018 4696 Gross per 24 hour  Intake 2915 ml  Output 1850 ml  Net 1065 ml   Filed Weights   05/31/18 0034  Weight: 79.4 kg    Examination:  Constitutional: Is no distress, pleasant male Eyes: No scleral icterus ENMT: mmm Respiratory: Clear to auscultation bilaterally without wheezing or crackles.  Normal respiratory effort Cardiovascular: Regular rate and rhythm, no murmurs appreciated.  No  peripheral edema Abdomen: Soft, nontender, nondistended, bowel sounds positive Neurologic: Nonfocal, equal strength Psychiatric: Normal judgment and insight. Alert and oriented x 3. Normal mood.   Data Reviewed: I have independently reviewed following labs and imaging studies   CBC: Recent Labs  Lab 05/31/18 0043 05/31/18 1451 06/02/18 0222  WBC 6.5 5.7 5.6  HGB 10.2* 9.4* 9.5*  HCT 31.2* 28.2* 28.7*  MCV 86.9 85.5 85.7  PLT 154 138* 433*   Basic Metabolic Panel: Recent Labs  Lab 05/31/18 0043  05/31/18 1451 06/01/18 0603 06/02/18 0222  NA 136  --  139 139  K 3.0*  --  3.0* 3.0*  CL 81*  --  89* 91*  CO2 32  --  31 29  GLUCOSE 127*  --  115* 116*  BUN 205*  --  195* 188*  CREATININE 11.08* 11.02* 10.54* 9.83*  CALCIUM 10.6*  --  9.8 9.6  MG 2.8*  --   --   --   PHOS 9.1*  --   --   --    GFR: Estimated Creatinine Clearance: 9 mL/min (A) (by C-G formula based on SCr of 9.83 mg/dL (H)). Liver Function Tests: No results for input(s): AST, ALT, ALKPHOS, BILITOT, PROT, ALBUMIN in the last 168 hours. No results for input(s): LIPASE, AMYLASE in the last 168 hours. No results for input(s): AMMONIA in the last 168 hours. Coagulation Profile: No results for input(s): INR, PROTIME in the last 168 hours. Cardiac Enzymes: No results for input(s): CKTOTAL, CKMB, CKMBINDEX, TROPONINI in the last 168 hours. BNP (last 3 results) No results for input(s): PROBNP in the last 8760 hours. HbA1C: No results for input(s): HGBA1C in the last 72 hours. CBG: No results for input(s): GLUCAP in the last 168 hours. Lipid Profile: No results for input(s): CHOL, HDL, LDLCALC, TRIG, CHOLHDL, LDLDIRECT in the last 72 hours. Thyroid Function Tests: No results for input(s): TSH, T4TOTAL, FREET4, T3FREE, THYROIDAB in the last 72 hours. Anemia Panel: No results for input(s): VITAMINB12, FOLATE, FERRITIN, TIBC, IRON, RETICCTPCT in the last 72 hours. Urine analysis:    Component Value Date/Time   COLORURINE YELLOW 05/31/2018 0040   APPEARANCEUR CLEAR 05/31/2018 0040   LABSPEC 1.011 05/31/2018 0040   PHURINE 5.0 05/31/2018 0040   GLUCOSEU NEGATIVE 05/31/2018 0040   GLUCOSEU NEGATIVE 03/13/2012 0902   HGBUR NEGATIVE 05/31/2018 0040   BILIRUBINUR NEGATIVE 05/31/2018 0040   KETONESUR NEGATIVE 05/31/2018 0040   PROTEINUR NEGATIVE 05/31/2018 0040   UROBILINOGEN 0.2 03/13/2012 0902   NITRITE NEGATIVE 05/31/2018 0040   LEUKOCYTESUR NEGATIVE 05/31/2018 0040   Sepsis Labs: Invalid input(s):  PROCALCITONIN, LACTICIDVEN  No results found for this or any previous visit (from the past 240 hour(s)).    Radiology Studies: No results found.    Marzetta Board, MD, PhD [facility] Pager [phone]  If 7PM-7AM, please contact night-coverage [email or web address] Password [email or web address] 06/02/2018, 10:11 AM

## 2018-06-03 ENCOUNTER — Inpatient Hospital Stay (HOSPITAL_COMMUNITY): Payer: 59

## 2018-06-03 DIAGNOSIS — N186 End stage renal disease: Secondary | ICD-10-CM

## 2018-06-03 LAB — RENAL FUNCTION PANEL
ALBUMIN: 3.6 g/dL (ref 3.5–5.0)
Anion gap: 17 — ABNORMAL HIGH (ref 5–15)
BUN: 169 mg/dL — ABNORMAL HIGH (ref 6–20)
CO2: 30 mmol/L (ref 22–32)
Calcium: 9.8 mg/dL (ref 8.9–10.3)
Chloride: 92 mmol/L — ABNORMAL LOW (ref 98–111)
Creatinine, Ser: 8.91 mg/dL — ABNORMAL HIGH (ref 0.61–1.24)
GFR calc Af Amer: 7 mL/min — ABNORMAL LOW (ref >60)
GFR calc non Af Amer: 6 mL/min — ABNORMAL LOW (ref >60)
Glucose, Bld: 114 mg/dL — ABNORMAL HIGH (ref 70–99)
PHOSPHORUS: 6.1 mg/dL — AB (ref 2.5–4.6)
Potassium: 2.9 mmol/L — ABNORMAL LOW (ref 3.5–5.1)
Sodium: 139 mmol/L (ref 135–145)

## 2018-06-03 LAB — FERRITIN: Ferritin: 91 ng/mL (ref 24–336)

## 2018-06-03 LAB — IRON AND TIBC
Iron: 54 ug/dL (ref 45–182)
Saturation Ratios: 15 % — ABNORMAL LOW (ref 17.9–39.5)
TIBC: 353 ug/dL (ref 250–450)
UIBC: 299 ug/dL

## 2018-06-03 MED ORDER — DARBEPOETIN ALFA 100 MCG/0.5ML IJ SOSY
100.0000 ug | PREFILLED_SYRINGE | INTRAMUSCULAR | Status: DC
  Filled 2018-06-03 (×2): qty 0.5

## 2018-06-03 MED ORDER — SODIUM CHLORIDE 0.9 % IV SOLN
250.0000 mg | Freq: Every day | INTRAVENOUS | Status: DC
  Administered 2018-06-03 – 2018-06-04 (×2): 250 mg via INTRAVENOUS
  Filled 2018-06-03 (×4): qty 20

## 2018-06-03 MED ORDER — POTASSIUM CHLORIDE CRYS ER 20 MEQ PO TBCR
40.0000 meq | EXTENDED_RELEASE_TABLET | Freq: Two times a day (BID) | ORAL | Status: AC
  Administered 2018-06-03 (×2): 40 meq via ORAL
  Filled 2018-06-03 (×2): qty 2

## 2018-06-03 MED ORDER — FUROSEMIDE 80 MG PO TABS
80.0000 mg | ORAL_TABLET | Freq: Two times a day (BID) | ORAL | Status: DC
  Administered 2018-06-03 – 2018-06-04 (×3): 80 mg via ORAL
  Filled 2018-06-03 (×2): qty 1

## 2018-06-03 NOTE — Progress Notes (Signed)
Subjective:  1000 UOP recorded  looks like missed a shift - BUN and crt trending down but still markedly abnormal  - overall he says subjectively he is feeling better, appetite back- afraid to leave as he will need to come right back.  Strongly considering having AVF placed this hosp  Objective Vital signs in last 24 hours: Vitals:   06/02/18 0839 06/02/18 1726 06/03/18 0010 06/03/18 0809  BP: (!) 165/101 (!) 160/89 (!) 162/93 (!) 161/91  Pulse: 71 71  75  Resp:  15  20  Temp: 98 F (36.7 C) 98.1 F (36.7 C) 98.8 F (37.1 C) 98.8 F (37.1 C)  TempSrc: Oral Oral Oral Oral  SpO2: 99% 100% 98% 98%  Weight:      Height:       Weight change:   Intake/Output Summary (Last 24 hours) at 06/03/2018 0836 Last data filed at 06/02/2018 1700 Gross per 24 hour  Intake 445 ml  Output 1000 ml  Net -555 ml    Assessment/ Plan: Pt is a 62 y.o. yo male with advanced CKD at baseline f/b Dr. Jimmy Footman who was admitted on 05/31/2018 with  A on CRF in the setting of diuresis   Assessment/Plan: 1. Renal - A on chronic in the setting of diuresis  - baseline seems to be between  4 and 5.  Has refused to participate in any pre dialysis planning.  He says he was fine until he got that medicine (metolazone) understands he is on the edge but does not want to do dialysis unless absolutely necessary.  Will not get AVF although reconsidering that decision.  Truth be told function is trending better even though far from normal and subjectively he reports improvement. Plan eventually as OP will be more frequent labs to monitor even though I am sure he will not consent to HD until he feels badly  2. HTN/volume- do not think is dry given high BP- have stopped IVF - cont clonidine/hydralazine and labetalol- will add back diuretic at 80 BID- half of OP dose - would like to see BP better for discharge 3. Anemia- iron stores low, will replete- also add ESA - discussed with pt 4. Secondary hyperparathyroidism- last PTH was  good as OP- first phos was 9- recheck 6.1- as improves hopefully would not need binder, will not start one 5. Hypokalemia- replete- give 80 meq today   Louis Meckel    Labs: Basic Metabolic Panel: Recent Labs  Lab 05/31/18 0043  06/01/18 0603 06/02/18 0222 06/03/18 0548  NA 136  --  139 139 139  K 3.0*  --  3.0* 3.0* 2.9*  CL 81*  --  89* 91* 92*  CO2 32  --  31 29 30   GLUCOSE 127*  --  115* 116* 114*  BUN 205*  --  195* 188* 169*  CREATININE 11.08*   < > 10.54* 9.83* 8.91*  CALCIUM 10.6*  --  9.8 9.6 9.8  PHOS 9.1*  --   --   --  6.1*   < > = values in this interval not displayed.   Liver Function Tests: Recent Labs  Lab 06/03/18 0548  ALBUMIN 3.6   No results for input(s): LIPASE, AMYLASE in the last 168 hours. No results for input(s): AMMONIA in the last 168 hours. CBC: Recent Labs  Lab 05/31/18 0043 05/31/18 1451 06/02/18 0222  WBC 6.5 5.7 5.6  HGB 10.2* 9.4* 9.5*  HCT 31.2* 28.2* 28.7*  MCV 86.9 85.5 85.7  PLT 154 138* 129*   Cardiac Enzymes: No results for input(s): CKTOTAL, CKMB, CKMBINDEX, TROPONINI in the last 168 hours. CBG: No results for input(s): GLUCAP in the last 168 hours.  Iron Studies:  Recent Labs    06/03/18 0548  IRON 54  TIBC 353  FERRITIN 91   Studies/Results: No results found. Medications: Infusions:   Scheduled Medications: . aspirin EC  81 mg Oral Daily  . cloNIDine  0.3 mg Oral TID  . heparin  5,000 Units Subcutaneous Q8H  . hydrALAZINE  50 mg Oral TID  . labetalol  200 mg Oral TID  . omega-3 acid ethyl esters  1 g Oral BID  . ramelteon  8 mg Oral QHS    have reviewed scheduled and prn medications.  Physical Exam: General: NAD Heart: RRR Lungs: mostly clear Abdomen: soft, non tender Extremities: min edema    06/03/2018,8:36 AM  LOS: 3 days

## 2018-06-03 NOTE — Progress Notes (Signed)
PROGRESS NOTE  [name] KGU:[id number] DOB: 06/10/1956 DOA: 05/31/2018 PCP: Binnie Rail, MD   LOS: 3 days   Brief Narrative / Interim history: 62 year old male with history of hypertension, chronic diastolic CHF, chronic kidney disease stage III with baseline creatinine around 2 followed by Dr. Jimmy Footman, hyperlipidemia who came to the hospital with worsening renal failure.  He was seen as an outpatient few weeks ago, felt to be fluid overloaded and started on metolazone.  He took that for about 9 days, fluid status got better but his creatinine went up for which she was admitted.  Subjective: -Feels like he is improving this morning, appetite better and eating more.  No shortness of breath, no chest pain.  Able to ambulate.  Assessment & Plan: Principal Problem:   ARF (acute renal failure) (HCC) Active Problems:   Dyslipidemia   Chronic systolic heart failure (HCC)   CKD (chronic kidney disease) stage 4, GFR 15-29 ml/min (HCC)   OSA (obstructive sleep apnea)   Acute on chronic renal failure (HCC)   Principal Problem Acute kidney injury in the setting of chronic kidney disease stage III -Felt to be dehydrated on admission, his diuretics were held and he was placed on IV fluids. -Baseline creatinine around 2 -Nephrology consulted, discussed with Dr. Moshe Cipro today, fluids discontinued 12/16 and she will resume patient's diuretics today, continue to closely monitor renal function.  Creatinine significantly elevated when compared to his baseline  Additional Problems Hypertension -Continue clonidine, hydralazine and labetalol.  Hold ARB and diuretic -Patient does not want amlodipine as she apparently had lower extremity swelling with it, will discontinue -Resumed Lasix today, continue to monitor blood pressure  Chronic diastolic CHF -Appears euvolemic without evidence of fluid overload.  Now on Lasix  Anemia in the setting of chronic kidney disease -Hemoglobin stable,  no evidence of bleed  Hypokalemia -Remains significantly decreased to 2.9 this morning, aggressively replete  Scheduled Meds: . aspirin EC  81 mg Oral Daily  . cloNIDine  0.3 mg Oral TID  . darbepoetin (ARANESP) injection - NON-DIALYSIS  100 mcg Subcutaneous Q Tue-1800  . furosemide  80 mg Oral BID  . heparin  5,000 Units Subcutaneous Q8H  . hydrALAZINE  50 mg Oral TID  . labetalol  200 mg Oral TID  . omega-3 acid ethyl esters  1 g Oral BID  . potassium chloride  40 mEq Oral BID  . ramelteon  8 mg Oral QHS   Continuous Infusions: . ferric gluconate (FERRLECIT/NULECIT) IV     PRN Meds:.clonazePAM  DVT prophylaxis: Heparin Code Status: Full code Family Communication: No family present at bedside Disposition Plan: Home when cleared by nephrology  Consultants:   Nephrology  Procedures:   None   Antimicrobials:  None    Objective: Vitals:   06/02/18 0839 06/02/18 1726 06/03/18 0010 06/03/18 0809  BP: (!) 165/101 (!) 160/89 (!) 162/93 (!) 161/91  Pulse: 71 71  75  Resp:  15  20  Temp: 98 F (36.7 C) 98.1 F (36.7 C) 98.8 F (37.1 C) 98.8 F (37.1 C)  TempSrc: Oral Oral Oral Oral  SpO2: 99% 100% 98% 98%  Weight:      Height:        Intake/Output Summary (Last 24 hours) at 06/03/2018 1117 Last data filed at 06/02/2018 1700 Gross per 24 hour  Intake 445 ml  Output 500 ml  Net -55 ml   Filed Weights   05/31/18 0034  Weight: 79.4 kg    Examination:  Constitutional: NAD Eyes: No icterus ENMT: Moist mucous membranes Respiratory: Clear to auscultation bilaterally without wheezing or crackles, normal respiratory effort Cardiovascular: Regular rate and rhythm, no murmurs.  No peripheral edema Abdomen: Soft, nontender, nondistended, positive bowel sounds Neurologic: No focal deficits, equal strength Psychiatric: Normal judgment and insight. Alert and oriented x 3. Normal mood.   Data Reviewed: I have independently reviewed following labs and imaging  studies   CBC: Recent Labs  Lab 05/31/18 0043 05/31/18 1451 06/02/18 0222  WBC 6.5 5.7 5.6  HGB 10.2* 9.4* 9.5*  HCT 31.2* 28.2* 28.7*  MCV 86.9 85.5 85.7  PLT 154 138* 419*   Basic Metabolic Panel: Recent Labs  Lab 05/31/18 0043 05/31/18 1451 06/01/18 0603 06/02/18 0222 06/03/18 0548  NA 136  --  139 139 139  K 3.0*  --  3.0* 3.0* 2.9*  CL 81*  --  89* 91* 92*  CO2 32  --  31 29 30   GLUCOSE 127*  --  115* 116* 114*  BUN 205*  --  195* 188* 169*  CREATININE 11.08* 11.02* 10.54* 9.83* 8.91*  CALCIUM 10.6*  --  9.8 9.6 9.8  MG 2.8*  --   --   --   --   PHOS 9.1*  --   --   --  6.1*   GFR: Estimated Creatinine Clearance: 9.9 mL/min (A) (by C-G formula based on SCr of 8.91 mg/dL (H)). Liver Function Tests: Recent Labs  Lab 06/03/18 0548  ALBUMIN 3.6   No results for input(s): LIPASE, AMYLASE in the last 168 hours. No results for input(s): AMMONIA in the last 168 hours. Coagulation Profile: No results for input(s): INR, PROTIME in the last 168 hours. Cardiac Enzymes: No results for input(s): CKTOTAL, CKMB, CKMBINDEX, TROPONINI in the last 168 hours. BNP (last 3 results) No results for input(s): PROBNP in the last 8760 hours. HbA1C: No results for input(s): HGBA1C in the last 72 hours. CBG: No results for input(s): GLUCAP in the last 168 hours. Lipid Profile: No results for input(s): CHOL, HDL, LDLCALC, TRIG, CHOLHDL, LDLDIRECT in the last 72 hours. Thyroid Function Tests: No results for input(s): TSH, T4TOTAL, FREET4, T3FREE, THYROIDAB in the last 72 hours. Anemia Panel: Recent Labs    06/03/18 0548  FERRITIN 91  TIBC 353  IRON 54   Urine analysis:    Component Value Date/Time   COLORURINE YELLOW 05/31/2018 0040   APPEARANCEUR CLEAR 05/31/2018 0040   LABSPEC 1.011 05/31/2018 0040   PHURINE 5.0 05/31/2018 0040   GLUCOSEU NEGATIVE 05/31/2018 0040   GLUCOSEU NEGATIVE 03/13/2012 0902   HGBUR NEGATIVE 05/31/2018 0040   BILIRUBINUR NEGATIVE 05/31/2018  0040   KETONESUR NEGATIVE 05/31/2018 0040   PROTEINUR NEGATIVE 05/31/2018 0040   UROBILINOGEN 0.2 03/13/2012 0902   NITRITE NEGATIVE 05/31/2018 0040   LEUKOCYTESUR NEGATIVE 05/31/2018 0040   Sepsis Labs: Invalid input(s): PROCALCITONIN, LACTICIDVEN  No results found for this or any previous visit (from the past 240 hour(s)).    Radiology Studies: No results found.    Marzetta Board, MD, PhD [facility] Pager [phone]  If 7PM-7AM, please contact night-coverage [email or web address] Password [email or web address] 06/03/2018, 11:17 AM

## 2018-06-04 DIAGNOSIS — N184 Chronic kidney disease, stage 4 (severe): Secondary | ICD-10-CM

## 2018-06-04 DIAGNOSIS — I5032 Chronic diastolic (congestive) heart failure: Secondary | ICD-10-CM

## 2018-06-04 LAB — RENAL FUNCTION PANEL
ANION GAP: 18 — AB (ref 5–15)
Albumin: 4 g/dL (ref 3.5–5.0)
BUN: 162 mg/dL — ABNORMAL HIGH (ref 6–20)
CO2: 29 mmol/L (ref 22–32)
Calcium: 10.1 mg/dL (ref 8.9–10.3)
Chloride: 91 mmol/L — ABNORMAL LOW (ref 98–111)
Creatinine, Ser: 8.34 mg/dL — ABNORMAL HIGH (ref 0.61–1.24)
GFR calc Af Amer: 7 mL/min — ABNORMAL LOW (ref >60)
GFR calc non Af Amer: 6 mL/min — ABNORMAL LOW (ref >60)
Glucose, Bld: 110 mg/dL — ABNORMAL HIGH (ref 70–99)
PHOSPHORUS: 5.4 mg/dL — AB (ref 2.5–4.6)
Potassium: 3 mmol/L — ABNORMAL LOW (ref 3.5–5.1)
Sodium: 138 mmol/L (ref 135–145)

## 2018-06-04 LAB — CBC
HCT: 32.1 % — ABNORMAL LOW (ref 39.0–52.0)
Hemoglobin: 10.4 g/dL — ABNORMAL LOW (ref 13.0–17.0)
MCH: 28 pg (ref 26.0–34.0)
MCHC: 32.4 g/dL (ref 30.0–36.0)
MCV: 86.5 fL (ref 80.0–100.0)
Platelets: 136 10*3/uL — ABNORMAL LOW (ref 150–400)
RBC: 3.71 MIL/uL — ABNORMAL LOW (ref 4.22–5.81)
RDW: 12.9 % (ref 11.5–15.5)
WBC: 5.4 10*3/uL (ref 4.0–10.5)
nRBC: 0 % (ref 0.0–0.2)

## 2018-06-04 LAB — PARATHYROID HORMONE, INTACT (NO CA): PTH: 52 pg/mL (ref 15–65)

## 2018-06-04 LAB — MAGNESIUM: Magnesium: 2.1 mg/dL (ref 1.7–2.4)

## 2018-06-04 MED ORDER — POTASSIUM CHLORIDE CRYS ER 20 MEQ PO TBCR
40.0000 meq | EXTENDED_RELEASE_TABLET | Freq: Two times a day (BID) | ORAL | Status: AC
  Administered 2018-06-04 (×2): 40 meq via ORAL
  Filled 2018-06-04 (×2): qty 2

## 2018-06-04 MED ORDER — CEFAZOLIN SODIUM-DEXTROSE 2-4 GM/100ML-% IV SOLN
2.0000 g | INTRAVENOUS | Status: AC
  Administered 2018-06-05: 2 g via INTRAVENOUS
  Filled 2018-06-04: qty 100

## 2018-06-04 MED ORDER — FUROSEMIDE 80 MG PO TABS
160.0000 mg | ORAL_TABLET | Freq: Two times a day (BID) | ORAL | Status: DC
  Administered 2018-06-04 – 2018-06-06 (×4): 160 mg via ORAL
  Filled 2018-06-04 (×5): qty 2

## 2018-06-04 NOTE — Progress Notes (Signed)
PROGRESS NOTE  [name] EZM:[id number] DOB: [birth year] DOA: 05/31/2018 PCP: Binnie Rail, MD   LOS: 4 days   Brief Narrative / Interim history: 62 year old male with history of hypertension, chronic diastolic CHF, chronic kidney disease stage III with baseline creatinine around 2 followed by Dr. Jimmy Footman, hyperlipidemia who came to the hospital with worsening renal failure.  He was seen as an outpatient few weeks ago, felt to be fluid overloaded and started on metolazone.  He took that for about 9 days, fluid status got better but his creatinine went up for which she was admitted.  Subjective: Fair no dsitress no n/v nor cp  Assessment & Plan: Principal Problem:   ARF (acute renal failure) (HCC) Active Problems:   Dyslipidemia   Chronic systolic heart failure (HCC)   CKD (chronic kidney disease) stage 4, GFR 15-29 ml/min (HCC)   OSA (obstructive sleep apnea)   Acute on chronic renal failure (HCC)   Principal Problem Acute kidney injury in the setting of chronic kidney disease stage III -Felt to be dehydrated on admission, his diuretics were held and he was placed on IV fluids. -Baseline creatinine around 2 -Nephrology Dr. Moshe Cipro on board-is on currently lasix 160 bid  Hypertension -Continue clonidine, hydralazine and labetalol.  Hold ARB and diuretic -Patient does not want amlodipine as she apparently had lower extremity swelling with it, will discontinue -Resumed Lasix today, continue to monitor blood pressure  Chronic diastolic CHF -Now on Lasix  Anemia in the setting of chronic kidney disease -Hemoglobin stable, no evidence of bleed -getting IV iron-doesn't wish to use aranesp  Hypokalemia -Remains significantly decreased to 3.0this morning, aggressively repleting -MAg normal  Scheduled Meds: . aspirin EC  81 mg Oral Daily  . cloNIDine  0.3 mg Oral TID  . darbepoetin (ARANESP) injection - NON-DIALYSIS  100 mcg Subcutaneous Q Tue-1800  . furosemide   160 mg Oral BID  . heparin  5,000 Units Subcutaneous Q8H  . hydrALAZINE  50 mg Oral TID  . labetalol  200 mg Oral TID  . omega-3 acid ethyl esters  1 g Oral BID  . potassium chloride  40 mEq Oral BID  . ramelteon  8 mg Oral QHS   Continuous Infusions: . ferric gluconate (FERRLECIT/NULECIT) IV 250 mg (06/04/18 1216)   PRN Meds:.clonazePAM  DVT prophylaxis: Heparin Code Status: Full code Family Communication: No family present at bedside Disposition Plan: Home when cleared by nephrology  Consultants:   Nephrology  Procedures:   None   Antimicrobials:  None    Objective: Vitals:   06/03/18 1733 06/03/18 1734 06/03/18 2254 06/04/18 0809  BP: (!) 144/94  (!) 162/96 (!) 172/107  Pulse:  68 75 72  Resp:   14 18  Temp:   99 F (37.2 C) 97.7 F (36.5 C)  TempSrc:   Oral   SpO2:   98% 100%  Weight:      Height:        Intake/Output Summary (Last 24 hours) at 06/04/2018 1335 Last data filed at 06/04/2018 5035 Gross per 24 hour  Intake 360 ml  Output -  Net 360 ml   Filed Weights   05/31/18 0034  Weight: 79.4 kg    Examination:  Awake pleasant alert no dsitress eomi ncat no pallor no ict abd soft nt nd no rebound no guard No le edema s1 s 2no m/r/g Psych euthymic Neuro intact  Data Reviewed: I have independently reviewed following labs and imaging studies   CBC: Recent Labs  Lab 05/31/18 0043 05/31/18 1451 06/02/18 0222 06/04/18 0530  WBC 6.5 5.7 5.6 5.4  HGB 10.2* 9.4* 9.5* 10.4*  HCT 31.2* 28.2* 28.7* 32.1*  MCV 86.9 85.5 85.7 86.5  PLT 154 138* 129* 062*   Basic Metabolic Panel: Recent Labs  Lab 05/31/18 0043 05/31/18 1451 06/01/18 0603 06/02/18 0222 06/03/18 0548 06/04/18 0530  NA 136  --  139 139 139 138  K 3.0*  --  3.0* 3.0* 2.9* 3.0*  CL 81*  --  89* 91* 92* 91*  CO2 32  --  31 29 30 29   GLUCOSE 127*  --  115* 116* 114* 110*  BUN 205*  --  195* 188* 169* 162*  CREATININE 11.08* 11.02* 10.54* 9.83* 8.91* 8.34*  CALCIUM 10.6*   --  9.8 9.6 9.8 10.1  MG 2.8*  --   --   --   --  2.1  PHOS 9.1*  --   --   --  6.1* 5.4*   GFR: Estimated Creatinine Clearance: 10.6 mL/min (A) (by C-G formula based on SCr of 8.34 mg/dL (H)). Liver Function Tests: Recent Labs  Lab 06/03/18 0548 06/04/18 0530  ALBUMIN 3.6 4.0   No results for input(s): LIPASE, AMYLASE in the last 168 hours. No results for input(s): AMMONIA in the last 168 hours. Coagulation Profile: No results for input(s): INR, PROTIME in the last 168 hours. Cardiac Enzymes: No results for input(s): CKTOTAL, CKMB, CKMBINDEX, TROPONINI in the last 168 hours. BNP (last 3 results) No results for input(s): PROBNP in the last 8760 hours. HbA1C: No results for input(s): HGBA1C in the last 72 hours. CBG: No results for input(s): GLUCAP in the last 168 hours. Lipid Profile: No results for input(s): CHOL, HDL, LDLCALC, TRIG, CHOLHDL, LDLDIRECT in the last 72 hours. Thyroid Function Tests: No results for input(s): TSH, T4TOTAL, FREET4, T3FREE, THYROIDAB in the last 72 hours. Anemia Panel: Recent Labs    06/03/18 0548  FERRITIN 91  TIBC 353  IRON 54   Urine analysis:    Component Value Date/Time   COLORURINE YELLOW 05/31/2018 0040   APPEARANCEUR CLEAR 05/31/2018 0040   LABSPEC 1.011 05/31/2018 0040   PHURINE 5.0 05/31/2018 0040   GLUCOSEU NEGATIVE 05/31/2018 0040   GLUCOSEU NEGATIVE 03/13/2012 0902   HGBUR NEGATIVE 05/31/2018 0040   BILIRUBINUR NEGATIVE 05/31/2018 0040   KETONESUR NEGATIVE 05/31/2018 0040   PROTEINUR NEGATIVE 05/31/2018 0040   UROBILINOGEN 0.2 03/13/2012 0902   NITRITE NEGATIVE 05/31/2018 0040   LEUKOCYTESUR NEGATIVE 05/31/2018 0040   Sepsis Labs: Invalid input(s): PROCALCITONIN, LACTICIDVEN  No results found for this or any previous visit (from the past 240 hour(s)).    Radiology Studies: Vas [place] Upper Ext Vein Mapping (pre-op Avf)  Result Date: 06/03/2018 UPPER EXTREMITY VEIN MAPPING  Indications: Pre-access. Performing  Technologist: Landry Mellow RDMS, RVT  Examination Guidelines: A complete evaluation includes B-mode imaging, spectral Doppler, color Doppler, and power Doppler as needed of all accessible portions of each vessel. Bilateral testing is considered an integral part of a complete examination. Limited examinations for reoccurring indications may be performed as noted. +-----------------+-------------+----------+---------+ Right Cephalic   Diameter (cm)Depth (cm)Findings  +-----------------+-------------+----------+---------+ Shoulder             0.23                         +-----------------+-------------+----------+---------+ Mid upper arm        0.23        0.16             +-----------------+-------------+----------+---------+  Dist upper arm       0.19        0.21             +-----------------+-------------+----------+---------+ Antecubital fossa    0.27                         +-----------------+-------------+----------+---------+ Prox forearm         0.22        0.32   branching +-----------------+-------------+----------+---------+ Mid forearm          0.33        0.22             +-----------------+-------------+----------+---------+ +-----------------+-------------+----------+---------+ Right Basilic    Diameter (cm)Depth (cm)Findings  +-----------------+-------------+----------+---------+ Prox upper arm       0.35        0.29             +-----------------+-------------+----------+---------+ Mid upper arm        0.44        0.21             +-----------------+-------------+----------+---------+ Dist upper arm       0.41        0.28   branching +-----------------+-------------+----------+---------+ Antecubital fossa    0.28               branching +-----------------+-------------+----------+---------+ +-----------------+-------------+----------+---------+ Left Cephalic    Diameter (cm)Depth (cm)Findings   +-----------------+-------------+----------+---------+ Shoulder             0.13                         +-----------------+-------------+----------+---------+ Mid upper arm        0.13                         +-----------------+-------------+----------+---------+ Dist upper arm       0.13                         +-----------------+-------------+----------+---------+ Antecubital fossa    0.16                         +-----------------+-------------+----------+---------+ Prox forearm         0.15               branching +-----------------+-------------+----------+---------+ +-----------------+-------------+----------+---------+ Left Basilic     Diameter (cm)Depth (cm)Findings  +-----------------+-------------+----------+---------+ Prox upper arm       0.50        0.29             +-----------------+-------------+----------+---------+ Mid upper arm        0.41        0.21             +-----------------+-------------+----------+---------+ Dist upper arm       0.48        0.17             +-----------------+-------------+----------+---------+ Antecubital fossa    0.42        0.32   branching +-----------------+-------------+----------+---------+ *See table(s) above for measurements and observations.  Diagnosing physician: Deitra Mayo MD Electronically signed by Deitra Mayo MD on 06/03/2018 at 4:31:35 PM.    Final       Marzetta Board, MD, PhD [facility] Pager [phone]  If 7PM-7AM, please contact night-coverage [email or web address] Password [email or web address] 06/04/2018, 1:35 PM

## 2018-06-04 NOTE — Consult Note (Addendum)
Hospital Consult    Reason for Consult: Need for dialysis access Requesting Physician: Dr. Moshe Cipro MRN #:  [id number]  History of Present Illness: This is a 62 y.o. male with past medical history significant for hypertension, hyperlipidemia, chronic diastolic CHF, and progressive CKD.  He is seen in consultation for evaluation for permanent dialysis access placement.  He is right arm dominant and would prefer access placement in left arm.  Vein mapping performed on 06/03/2018 demonstrates adequate left basilic vein for conduit use however inadequate cephalic vein.  He does not have a pacemaker.  He is not taking any blood thinners.  No plans for initiation of hemodialysis currently.  Denies chest pain or shortness of breath on exam.  Past Medical History:  Diagnosis Date  . ANEMIA   . Anxiety   . CHRONIC KIDNEY DISEASE STAGE III (MODERATE)   . Chronic systolic heart failure (HCC)    NICM - LVEF 30% echo 12/2008; improved to 50% on echo 03/2012  . DYSLIPIDEMIA   . ERECTILE DYSFUNCTION, ORGANIC   . Gout    1st attack 04/2015, diet changes to treat same  . Hyperparathyroidism, secondary ([place])   . HYPERTENSION   . OSA (obstructive sleep apnea)    mild, CPAP not recommended 08/2012    Past Surgical History:  Procedure Laterality Date  . VASECTOMY      Allergies  Allergen Reactions  . Amlodipine Swelling  . Metolazone Other (See Comments)    Made kidney function worse    Prior to Admission medications   Medication Sig Start Date End Date Taking? Authorizing Provider  allopurinol (ZYLOPRIM) 100 MG tablet Take 200 mg by mouth daily.   Yes [provider]  aspirin EC 81 MG tablet Take 81 mg by mouth daily.   Yes [provider]  calcitRIOL (ROCALTROL) 0.5 MCG capsule Take 1 capsule (0.5 mcg total) by mouth daily. 05/16/15  Yes Rowe Clack, MD  cloNIDine (CATAPRES) 0.3 MG tablet TAKE 1 TABLET(0.3 MG) BY MOUTH THREE TIMES DAILY Patient taking  differently: Take 0.3 mg by mouth 3 (three) times daily.  10/31/15  Yes Golden Circle, FNP  cloNIDine (CATAPRES) 0.3 MG tablet Take 1 tablet (0.3 mg total) by mouth 3 (three) times daily. Must keep 05/02/16 appt before future refills 04/23/16  Yes Burns, Claudina Lick, MD  furosemide (LASIX) 80 MG tablet Take 1 tablet (80 mg total) by mouth 2 (two) times daily. Yearly physical due in may must see MD for refills 06/27/16  Yes Burns, Claudina Lick, MD  hydrALAZINE (APRESOLINE) 50 MG tablet Take 1 tablet (50 mg total) by mouth 3 (three) times daily. 10/31/15  Yes Burns, Claudina Lick, MD  labetalol (NORMODYNE) 200 MG tablet Take 1 tablet (200 mg total) by mouth 3 (three) times daily. --patient has scheduled may/2017 appt with new pcp-dr burns 07/06/15  Yes Burns, Claudina Lick, MD  potassium chloride SA (KLOR-CON M20) 20 MEQ tablet Take 1 tablet (20 mEq total) by mouth daily. 03/13/12  Yes Rowe Clack, MD  amLODipine (NORVASC) 5 MG tablet Take 1 tablet (5 mg total) by mouth daily. Patient not taking: Reported on 05/31/2018 10/31/15   Binnie Rail, MD  clonazePAM (KLONOPIN) 0.5 MG tablet Take 1 tablet (0.5 mg total) by mouth 2 (two) times daily as needed for anxiety. Patient not taking: Reported on 05/31/2018 11/03/14   Golden Circle, FNP  loratadine (CLARITIN) 10 MG tablet Take 1 tablet (10 mg total) by mouth daily as needed for  allergies. Patient not taking: Reported on 05/31/2018 09/07/13   Rowe Clack, MD  Omega-3 Fatty Acids (FISH OIL) 1200 MG CPDR Take 1 capsule by mouth 2 (two) times daily. Patient not taking: Reported on 05/31/2018 09/07/13   Rowe Clack, MD  sildenafil (VIAGRA) 100 MG tablet Take 1 tablet (100 mg total) by mouth as needed for erectile dysfunction. Patient not taking: Reported on 05/31/2018 03/17/15   Rowe Clack, MD  telmisartan (MICARDIS) 80 MG tablet Take 1 tablet (80 mg total) by mouth daily. Patient not taking: Reported on 05/31/2018 08/04/14   Rowe Clack, MD     Social History   Socioeconomic History  . Marital status: Legally Separated    Spouse name: Not on file  . Number of children: Not on file  . Years of education: Not on file  . Highest education level: Not on file  Occupational History  . Not on file  Social Needs  . Financial resource strain: Not on file  . Food insecurity:    Worry: Not on file    Inability: Not on file  . Transportation needs:    Medical: Not on file    Non-medical: Not on file  Tobacco Use  . Smoking status: Never Smoker  . Smokeless tobacco: Never Used  Substance and Sexual Activity  . Alcohol use: No    Alcohol/week: 0.0 standard drinks  . Drug use: No  . Sexual activity: Not on file  Lifestyle  . Physical activity:    Days per week: Not on file    Minutes per session: Not on file  . Stress: Not on file  Relationships  . Social connections:    Talks on phone: Not on file    Gets together: Not on file    Attends religious service: Not on file    Active member of club or organization: Not on file    Attends meetings of clubs or organizations: Not on file    Relationship status: Not on file  . Intimate partner violence:    Fear of current or ex partner: Not on file    Emotionally abused: Not on file    Physically abused: Not on file    Forced sexual activity: Not on file  Other Topics Concern  . Not on file  Social History Narrative   Lives alone, divorced x's 3     Family History  Problem Relation Age of Onset  . Hypertension Mother   . Hypertension Father   . COPD Father   . Coronary artery disease Father   . Coronary artery disease Brother 35       sudden death with MI    ROS: Otherwise negative unless mentioned in HPI  Physical Examination  Vitals:   06/03/18 2254 06/04/18 0809  BP: (!) 162/96 (!) 172/107  Pulse: 75 72  Resp: 14 18  Temp: 99 F (37.2 C) 97.7 F (36.5 C)  SpO2: 98% 100%   Body mass index is 22.47 kg/m.  General:  WDWN in NAD Gait: Not  observed HENT: WNL, normocephalic Pulmonary: normal non-labored breathing Cardiac: regular Abdomen:  soft, NT/ND, no masses Skin: without rashes Vascular Exam/Pulses: Symmetrical radial pulses. Extremities: without ischemic changes, without Gangrene , without cellulitis; without open wounds;  Musculoskeletal: no muscle wasting or atrophy  Neurologic: A&O X 3;  No focal weakness or paresthesias are detected; speech is fluent/normal Psychiatric:  The pt has Normal affect. Lymph:  Unremarkable  CBC    Component  Value Date/Time   WBC 5.4 06/04/2018 0530   RBC 3.71 (L) 06/04/2018 0530   HGB 10.4 (L) 06/04/2018 0530   HCT 32.1 (L) 06/04/2018 0530   PLT 136 (L) 06/04/2018 0530   MCV 86.5 06/04/2018 0530   MCH 28.0 06/04/2018 0530   MCHC 32.4 06/04/2018 0530   RDW 12.9 06/04/2018 0530   LYMPHSABS 1.6 05/16/2015 1213   MONOABS 0.5 05/16/2015 1213   EOSABS 0.3 05/16/2015 1213   BASOSABS 0.0 05/16/2015 1213    BMET    Component Value Date/Time   NA 138 06/04/2018 0530   NA 139 08/19/2012   K 3.0 (L) 06/04/2018 0530   CL 91 (L) 06/04/2018 0530   CO2 29 06/04/2018 0530   GLUCOSE 110 (H) 06/04/2018 0530   BUN 162 (H) 06/04/2018 0530   BUN 26 (A) 08/19/2012   CREATININE 8.34 (H) 06/04/2018 0530   CALCIUM 10.1 06/04/2018 0530   CALCIUM 8.8 12/28/2008 1150   GFRNONAA 6 (L) 06/04/2018 0530   GFRAA 7 (L) 06/04/2018 0530    COAGS: No results found for: INR, PROTIME   Non-Invasive Vascular Imaging:   Examination Guidelines: A complete evaluation includes B-mode imaging, spectral  Doppler, color Doppler, and power Doppler as needed of all accessible portions  of each vessel. Bilateral testing is considered an integral part of a complete  examination. Limited examinations for reoccurring indications may be performed  as noted.    +-----------------+-------------+----------+---------+  Right Cephalic  Diameter (cm)Depth (cm)Findings    +-----------------+-------------+----------+---------+  Shoulder       0.23               +-----------------+-------------+----------+---------+  Mid upper arm    0.23     0.16         +-----------------+-------------+----------+---------+  Dist upper arm    0.19     0.21         +-----------------+-------------+----------+---------+  Antecubital fossa  0.27               +-----------------+-------------+----------+---------+  Prox forearm     0.22     0.32  branching  +-----------------+-------------+----------+---------+  Mid forearm     0.33     0.22         +-----------------+-------------+----------+---------+    +-----------------+-------------+----------+---------+  Right Basilic  Diameter (cm)Depth (cm)Findings   +-----------------+-------------+----------+---------+  Prox upper arm    0.35     0.29         +-----------------+-------------+----------+---------+  Mid upper arm    0.44     0.21         +-----------------+-------------+----------+---------+  Dist upper arm    0.41     0.28  branching  +-----------------+-------------+----------+---------+  Antecubital fossa  0.28        branching  +-----------------+-------------+----------+---------+    +-----------------+-------------+----------+---------+  Left Cephalic  Diameter (cm)Depth (cm)Findings   +-----------------+-------------+----------+---------+  Shoulder       0.13               +-----------------+-------------+----------+---------+  Mid upper arm    0.13               +-----------------+-------------+----------+---------+  Dist upper arm    0.13               +-----------------+-------------+----------+---------+  Antecubital fossa  0.16                +-----------------+-------------+----------+---------+  Prox forearm     0.15        branching  +-----------------+-------------+----------+---------+    +-----------------+-------------+----------+---------+  Left Basilic   Diameter (cm)Depth (cm)Findings   +-----------------+-------------+----------+---------+  Prox upper arm    0.50     0.29         +-----------------+-------------+----------+---------+  Mid upper arm    0.41     0.21         +-----------------+-------------+----------+---------+  Dist upper arm    0.48     0.17         +-----------------+-------------+----------+---------+  Antecubital fossa  0.42     0.32  branching  +-----------------+-------------+----------+---------+    *See table(s) above for measurements and observations.        Diagnosing physician: Deitra Mayo MD  Electronically signed by Deitra Mayo MD on 06/03/2018 at 4:31:35 PM.     ASSESSMENT/PLAN: This is a 62 y.o. male with progressive CKD being evaluated for permanent dialysis access  Right arm dominant Vein mapping demonstrating adequate left basilic vein Restrict left arm Plan will be for left arm AV fistula versus graft; discussed potentially a two-stage surgery if basilic vein is used On-call vascular surgeon Dr. Trula Slade will evaluate the patient later today and provide further treatment plan   Dagoberto Ligas PA-C [facility] [phone]   I agree with the above.  Have seen and evaluated the patient.  I have reviewed his vein mapping.  Since he is right-handed, we would consider left-sided access.  He appears to have an adequate left basilic vei I discussed proceeding with a for staged left basilic vein fistula tomorrow.  He will be n.p.o. after midnight.  I discussed the risks and benefits of the operation including the need for a second procedure, the risk of  non-maturity, and the risk of steal syndrome.  All of his questions have been answered.  [name]

## 2018-06-04 NOTE — Progress Notes (Signed)
Subjective:  No UOP - no weight recorded - BUN and crt trending down but still markedly abnormal  - overall he says subjectively he is feeling better, appetite back- after discussion with his family he has decided to proceed with AVF placement this hosp.  Pt refused the aranesp   "too many side effects "  Objective Vital signs in last 24 hours: Vitals:   06/03/18 1733 06/03/18 1734 06/03/18 2254 06/04/18 0809  BP: (!) 144/94  (!) 162/96 (!) 172/107  Pulse:  68 75 72  Resp:   14 18  Temp:   99 F (37.2 C) 97.7 F (36.5 C)  TempSrc:   Oral   SpO2:   98% 100%  Weight:      Height:       Weight change:   Intake/Output Summary (Last 24 hours) at 06/04/2018 0901 Last data filed at 06/03/2018 1315 Gross per 24 hour  Intake 360 ml  Output -  Net 360 ml    Assessment/ Plan: Pt is a 62 y.o. yo male with advanced CKD at baseline f/b Dr. Jimmy Footman who was admitted on 05/31/2018 with  A on CRF in the setting of diuresis   Assessment/Plan: 1. Renal - A on chronic in the setting of diuresis  - baseline seems to be between  4 and 5.  Has refused to participate in any pre dialysis planning.  He says he was fine until he got that medicine (metolazone) understands he is on the edge but does not want to do dialysis unless absolutely necessary.  Now agrees to get AVF- [facility] called this AM- would really like to get it in house- do not think he will return.  Truth be told function is trending better even though far from normal and subjectively he reports improvement. Plan eventually as OP will be more frequent labs to monitor even though I am sure he will not consent to HD until he feels badly  2. HTN/volume- do not think is dry given high BP-  stopped IVF - cont clonidine/hydralazine and labetalol- will add back diuretic at now 80 BID- half of OP dose - will inc to 160 BID and consider inc in hydralazine 3. Anemia- iron stores low, will replete- also added ESA but pt refused due to side effects -  4.  Secondary hyperparathyroidism- last PTH was good as OP- first phos was 9- recheck 6.1, then 5.4- as improves hopefully would not need binder, will not start one 5. Hypokalemia- replete- gave 80 meq yesterday- he also drank oj- needs more today - repeat 80 meq   Louis Meckel    Labs: Basic Metabolic Panel: Recent Labs  Lab 05/31/18 0043  06/02/18 0222 06/03/18 0548 06/04/18 0530  NA 136   < > 139 139 138  K 3.0*   < > 3.0* 2.9* 3.0*  CL 81*   < > 91* 92* 91*  CO2 32   < > 29 30 29   GLUCOSE 127*   < > 116* 114* 110*  BUN 205*   < > 188* 169* 162*  CREATININE 11.08*   < > 9.83* 8.91* 8.34*  CALCIUM 10.6*   < > 9.6 9.8 10.1  PHOS 9.1*  --   --  6.1* 5.4*   < > = values in this interval not displayed.   Liver Function Tests: Recent Labs  Lab 06/03/18 0548 06/04/18 0530  ALBUMIN 3.6 4.0   No results for input(s): LIPASE, AMYLASE in the last 168 hours. No  results for input(s): AMMONIA in the last 168 hours. CBC: Recent Labs  Lab 05/31/18 0043 05/31/18 1451 06/02/18 0222 06/04/18 0530  WBC 6.5 5.7 5.6 5.4  HGB 10.2* 9.4* 9.5* 10.4*  HCT 31.2* 28.2* 28.7* 32.1*  MCV 86.9 85.5 85.7 86.5  PLT 154 138* 129* 136*   Cardiac Enzymes: No results for input(s): CKTOTAL, CKMB, CKMBINDEX, TROPONINI in the last 168 hours. CBG: No results for input(s): GLUCAP in the last 168 hours.  Iron Studies:  Recent Labs    06/03/18 0548  IRON 54  TIBC 353  FERRITIN 91   Studies/Results: Vas [place] Upper Ext Vein Mapping (pre-op Avf)  Result Date: 06/03/2018 UPPER EXTREMITY VEIN MAPPING  Indications: Pre-access. Performing Technologist: Landry Mellow RDMS, RVT  Examination Guidelines: A complete evaluation includes B-mode imaging, spectral Doppler, color Doppler, and power Doppler as needed of all accessible portions of each vessel. Bilateral testing is considered an integral part of a complete examination. Limited examinations for reoccurring indications may be performed as noted.  +-----------------+-------------+----------+---------+ Right Cephalic   Diameter (cm)Depth (cm)Findings  +-----------------+-------------+----------+---------+ Shoulder             0.23                         +-----------------+-------------+----------+---------+ Mid upper arm        0.23        0.16             +-----------------+-------------+----------+---------+ Dist upper arm       0.19        0.21             +-----------------+-------------+----------+---------+ Antecubital fossa    0.27                         +-----------------+-------------+----------+---------+ Prox forearm         0.22        0.32   branching +-----------------+-------------+----------+---------+ Mid forearm          0.33        0.22             +-----------------+-------------+----------+---------+ +-----------------+-------------+----------+---------+ Right Basilic    Diameter (cm)Depth (cm)Findings  +-----------------+-------------+----------+---------+ Prox upper arm       0.35        0.29             +-----------------+-------------+----------+---------+ Mid upper arm        0.44        0.21             +-----------------+-------------+----------+---------+ Dist upper arm       0.41        0.28   branching +-----------------+-------------+----------+---------+ Antecubital fossa    0.28               branching +-----------------+-------------+----------+---------+ +-----------------+-------------+----------+---------+ Left Cephalic    Diameter (cm)Depth (cm)Findings  +-----------------+-------------+----------+---------+ Shoulder             0.13                         +-----------------+-------------+----------+---------+ Mid upper arm        0.13                         +-----------------+-------------+----------+---------+ Dist upper arm       0.13                         +-----------------+-------------+----------+---------+  Antecubital fossa     0.16                         +-----------------+-------------+----------+---------+ Prox forearm         0.15               branching +-----------------+-------------+----------+---------+ +-----------------+-------------+----------+---------+ Left Basilic     Diameter (cm)Depth (cm)Findings  +-----------------+-------------+----------+---------+ Prox upper arm       0.50        0.29             +-----------------+-------------+----------+---------+ Mid upper arm        0.41        0.21             +-----------------+-------------+----------+---------+ Dist upper arm       0.48        0.17             +-----------------+-------------+----------+---------+ Antecubital fossa    0.42        0.32   branching +-----------------+-------------+----------+---------+ *See table(s) above for measurements and observations.  Diagnosing physician: Deitra Mayo MD Electronically signed by Deitra Mayo MD on 06/03/2018 at 4:31:35 PM.    Final    Medications: Infusions: . ferric gluconate (FERRLECIT/NULECIT) IV Stopped (06/03/18 1300)    Scheduled Medications: . aspirin EC  81 mg Oral Daily  . cloNIDine  0.3 mg Oral TID  . darbepoetin (ARANESP) injection - NON-DIALYSIS  100 mcg Subcutaneous Q Tue-1800  . furosemide  80 mg Oral BID  . heparin  5,000 Units Subcutaneous Q8H  . hydrALAZINE  50 mg Oral TID  . labetalol  200 mg Oral TID  . omega-3 acid ethyl esters  1 g Oral BID  . ramelteon  8 mg Oral QHS    have reviewed scheduled and prn medications.  Physical Exam: General: NAD Heart: RRR Lungs: mostly clear Abdomen: soft, non tender Extremities: min edema    06/04/2018,9:01 AM  LOS: 4 days

## 2018-06-04 NOTE — Progress Notes (Signed)
Nutrition Consult/Education Note  RD consulted for renal diet education.   Provided Food Pyramid for Healthy Eating with Kidney Disease from [employer]. Reviewed food groups and provided written recommended serving sizes specifically determined for patient's current nutritional status.   Explained why diet restrictions are needed and provided lists of foods to limit/avoid that are high potassium, sodium, and phosphorus. Provided specific recommendations on safer alternatives of these foods.   Discussed importance of protein intake at each meal and snack. Provided examples of how to maximize protein intake throughout the day.   Teach back method used. Expect good compliance.  Body mass index is 22.47 kg/m. Pt meets criteria for Normal based on current BMI.  Current diet order is Renal with fluid restriction, patient is consuming approximately 100% of meals at this time. Labs and medications reviewed.   No further nutrition interventions warranted at this time. If additional nutrition issues arise, please re-consult RD.  Arthur Holms, RD, LDN Pager #: [phone] After-Hours Pager #: [phone]

## 2018-06-05 ENCOUNTER — Inpatient Hospital Stay (HOSPITAL_COMMUNITY): Payer: 59 | Admitting: Certified Registered"

## 2018-06-05 ENCOUNTER — Encounter (HOSPITAL_COMMUNITY)
Admission: EM | Disposition: A | Discharge status: Home / Self Care | Payer: Self-pay | Source: Home / Self Care | Attending: Family Medicine

## 2018-06-05 ENCOUNTER — Encounter (HOSPITAL_COMMUNITY): Payer: Self-pay

## 2018-06-05 HISTORY — PX: BASCILIC VEIN TRANSPOSITION: SHX5742

## 2018-06-05 LAB — CBC
HCT: 29.8 % — ABNORMAL LOW (ref 39.0–52.0)
Hemoglobin: 9.9 g/dL — ABNORMAL LOW (ref 13.0–17.0)
MCH: 28.4 pg (ref 26.0–34.0)
MCHC: 33.2 g/dL (ref 30.0–36.0)
MCV: 85.4 fL (ref 80.0–100.0)
Platelets: 125 10*3/uL — ABNORMAL LOW (ref 150–400)
RBC: 3.49 MIL/uL — ABNORMAL LOW (ref 4.22–5.81)
RDW: 13 % (ref 11.5–15.5)
WBC: 5.2 10*3/uL (ref 4.0–10.5)
nRBC: 0 % (ref 0.0–0.2)

## 2018-06-05 LAB — RENAL FUNCTION PANEL
ALBUMIN: 3.8 g/dL (ref 3.5–5.0)
Anion gap: 16 — ABNORMAL HIGH (ref 5–15)
BUN: 159 mg/dL — ABNORMAL HIGH (ref 6–20)
CO2: 30 mmol/L (ref 22–32)
Calcium: 9.9 mg/dL (ref 8.9–10.3)
Chloride: 94 mmol/L — ABNORMAL LOW (ref 98–111)
Creatinine, Ser: 7.41 mg/dL — ABNORMAL HIGH (ref 0.61–1.24)
GFR calc Af Amer: 8 mL/min — ABNORMAL LOW (ref >60)
GFR calc non Af Amer: 7 mL/min — ABNORMAL LOW (ref >60)
Glucose, Bld: 115 mg/dL — ABNORMAL HIGH (ref 70–99)
PHOSPHORUS: 4.8 mg/dL — AB (ref 2.5–4.6)
Potassium: 3 mmol/L — ABNORMAL LOW (ref 3.5–5.1)
Sodium: 140 mmol/L (ref 135–145)

## 2018-06-05 LAB — PROTIME-INR
INR: 1.15
Prothrombin Time: 14.6 seconds (ref 11.4–15.2)

## 2018-06-05 SURGERY — TRANSPOSITION, VEIN, BASILIC
Anesthesia: Monitor Anesthesia Care | Site: Arm Upper | Laterality: Left

## 2018-06-05 MED ORDER — DIPHENHYDRAMINE HCL 50 MG/ML IJ SOLN
INTRAMUSCULAR | Status: AC
  Filled 2018-06-05: qty 1

## 2018-06-05 MED ORDER — FENTANYL CITRATE (PF) 250 MCG/5ML IJ SOLN
INTRAMUSCULAR | Status: AC
  Filled 2018-06-05: qty 5

## 2018-06-05 MED ORDER — PROTAMINE SULFATE 10 MG/ML IV SOLN
INTRAVENOUS | Status: DC | PRN
  Administered 2018-06-05 (×2): 5 mg via INTRAVENOUS
  Administered 2018-06-05: 15 mg via INTRAVENOUS

## 2018-06-05 MED ORDER — HEPARIN SODIUM (PORCINE) 1000 UNIT/ML IJ SOLN
INTRAMUSCULAR | Status: AC
  Filled 2018-06-05: qty 1

## 2018-06-05 MED ORDER — DIPHENHYDRAMINE HCL 50 MG/ML IJ SOLN
INTRAMUSCULAR | Status: DC | PRN
  Administered 2018-06-05: 12.5 mg via INTRAVENOUS

## 2018-06-05 MED ORDER — HYDRALAZINE HCL 20 MG/ML IJ SOLN
INTRAMUSCULAR | Status: DC | PRN
  Administered 2018-06-05: 5 mg via INTRAVENOUS

## 2018-06-05 MED ORDER — HEPARIN SODIUM (PORCINE) 1000 UNIT/ML IJ SOLN
INTRAMUSCULAR | Status: DC | PRN
  Administered 2018-06-05: 3000 [IU] via INTRAVENOUS

## 2018-06-05 MED ORDER — ACETAMINOPHEN 160 MG/5ML PO SOLN: 1000.0000 mg | Freq: Once | ORAL | Status: DC | PRN

## 2018-06-05 MED ORDER — LIDOCAINE-EPINEPHRINE (PF) 1 %-1:200000 IJ SOLN
INTRAMUSCULAR | Status: AC
  Filled 2018-06-05: qty 30

## 2018-06-05 MED ORDER — ONDANSETRON HCL 4 MG/2ML IJ SOLN
INTRAMUSCULAR | Status: DC | PRN
  Administered 2018-06-05: 4 mg via INTRAVENOUS

## 2018-06-05 MED ORDER — PROTAMINE SULFATE 10 MG/ML IV SOLN
INTRAVENOUS | Status: AC
  Filled 2018-06-05: qty 5

## 2018-06-05 MED ORDER — MIDAZOLAM HCL 2 MG/2ML IJ SOLN
INTRAMUSCULAR | Status: AC
  Filled 2018-06-05: qty 2

## 2018-06-05 MED ORDER — DEXAMETHASONE SODIUM PHOSPHATE 10 MG/ML IJ SOLN
INTRAMUSCULAR | Status: AC
  Filled 2018-06-05: qty 1

## 2018-06-05 MED ORDER — LIDOCAINE-EPINEPHRINE (PF) 1 %-1:200000 IJ SOLN
INTRAMUSCULAR | Status: DC | PRN
  Administered 2018-06-05: 5 mL

## 2018-06-05 MED ORDER — SODIUM CHLORIDE 0.9 % IV SOLN
INTRAVENOUS | Status: DC
  Administered 2018-06-05: 09:00:00 via INTRAVENOUS

## 2018-06-05 MED ORDER — HYDRALAZINE HCL 20 MG/ML IJ SOLN
INTRAMUSCULAR | Status: AC
  Filled 2018-06-05: qty 1

## 2018-06-05 MED ORDER — OXYCODONE-ACETAMINOPHEN 5-325 MG PO TABS: 1.0000 | ORAL_TABLET | Freq: Four times a day (QID) | ORAL | Status: DC | PRN

## 2018-06-05 MED ORDER — OXYCODONE HCL 5 MG/5ML PO SOLN: 5.0000 mg | Freq: Once | ORAL | Status: DC | PRN

## 2018-06-05 MED ORDER — PROPOFOL 500 MG/50ML IV EMUL
INTRAVENOUS | Status: DC | PRN
  Administered 2018-06-05: 60 ug/kg/min via INTRAVENOUS

## 2018-06-05 MED ORDER — ACETAMINOPHEN 10 MG/ML IV SOLN: 1000.0000 mg | Freq: Once | INTRAVENOUS | Status: DC | PRN

## 2018-06-05 MED ORDER — ONDANSETRON HCL 4 MG/2ML IJ SOLN
INTRAMUSCULAR | Status: AC
  Filled 2018-06-05: qty 2

## 2018-06-05 MED ORDER — DEXAMETHASONE SODIUM PHOSPHATE 4 MG/ML IJ SOLN
INTRAMUSCULAR | Status: DC | PRN
  Administered 2018-06-05: 4 mg via INTRAVENOUS

## 2018-06-05 MED ORDER — ACETAMINOPHEN 500 MG PO TABS: 1000.0000 mg | ORAL_TABLET | Freq: Once | ORAL | Status: DC | PRN

## 2018-06-05 MED ORDER — FENTANYL CITRATE (PF) 100 MCG/2ML IJ SOLN: 25.0000 ug | INTRAMUSCULAR | Status: DC | PRN

## 2018-06-05 MED ORDER — MIDAZOLAM HCL 5 MG/5ML IJ SOLN
INTRAMUSCULAR | Status: DC | PRN
  Administered 2018-06-05: 2 mg via INTRAVENOUS

## 2018-06-05 MED ORDER — PHENYLEPHRINE 40 MCG/ML (10ML) SYRINGE FOR IV PUSH (FOR BLOOD PRESSURE SUPPORT)
PREFILLED_SYRINGE | INTRAVENOUS | Status: AC
  Filled 2018-06-05: qty 10

## 2018-06-05 MED ORDER — PROPOFOL 10 MG/ML IV BOLUS
INTRAVENOUS | Status: DC | PRN
  Administered 2018-06-05: 20 mg via INTRAVENOUS
  Administered 2018-06-05: 50 mg via INTRAVENOUS
  Administered 2018-06-05: 30 mg via INTRAVENOUS

## 2018-06-05 MED ORDER — FENTANYL CITRATE (PF) 100 MCG/2ML IJ SOLN
INTRAMUSCULAR | Status: DC | PRN
  Administered 2018-06-05 (×3): 50 ug via INTRAVENOUS

## 2018-06-05 MED ORDER — SODIUM CHLORIDE 0.9 % IV SOLN
INTRAVENOUS | Status: AC
  Filled 2018-06-05: qty 1.2

## 2018-06-05 MED ORDER — LIDOCAINE 2% (20 MG/ML) 5 ML SYRINGE
INTRAMUSCULAR | Status: AC
  Filled 2018-06-05: qty 5

## 2018-06-05 MED ORDER — LIDOCAINE 2% (20 MG/ML) 5 ML SYRINGE
INTRAMUSCULAR | Status: DC | PRN
  Administered 2018-06-05: 40 mg via INTRAVENOUS

## 2018-06-05 MED ORDER — OXYCODONE HCL 5 MG PO TABS: 5.0000 mg | ORAL_TABLET | Freq: Once | ORAL | Status: DC | PRN

## 2018-06-05 MED ORDER — PHENYLEPHRINE 40 MCG/ML (10ML) SYRINGE FOR IV PUSH (FOR BLOOD PRESSURE SUPPORT)
PREFILLED_SYRINGE | INTRAVENOUS | Status: DC | PRN
  Administered 2018-06-05 (×2): 40 ug via INTRAVENOUS

## 2018-06-05 MED ORDER — POTASSIUM CHLORIDE CRYS ER 20 MEQ PO TBCR
40.0000 meq | EXTENDED_RELEASE_TABLET | Freq: Two times a day (BID) | ORAL | Status: AC
  Administered 2018-06-05 (×2): 40 meq via ORAL
  Filled 2018-06-05 (×2): qty 2

## 2018-06-05 MED ORDER — PROPOFOL 10 MG/ML IV BOLUS
INTRAVENOUS | Status: AC
  Filled 2018-06-05: qty 20

## 2018-06-05 MED ORDER — 0.9 % SODIUM CHLORIDE (POUR BTL) OPTIME
TOPICAL | Status: DC | PRN
  Administered 2018-06-05: 1000 mL

## 2018-06-05 MED ORDER — SODIUM CHLORIDE 0.9 % IV SOLN
INTRAVENOUS | Status: DC | PRN
  Administered 2018-06-05: 500 mL

## 2018-06-05 MED ORDER — SODIUM CHLORIDE 0.9 % IV SOLN
INTRAVENOUS | Status: DC | PRN
  Administered 2018-06-05: 09:00:00 via INTRAVENOUS

## 2018-06-05 SURGICAL SUPPLY — 34 items
ADH SKN CLS APL DERMABOND .7 (GAUZE/BANDAGES/DRESSINGS) ×1
ARMBAND PINK RESTRICT EXTREMIT (MISCELLANEOUS) ×2 IMPLANT
CANISTER SUCT 3000ML PPV (MISCELLANEOUS) ×2 IMPLANT
CLIP VESOCCLUDE MED 24/CT (CLIP) IMPLANT
CLIP VESOCCLUDE MED 6/CT (CLIP) ×1 IMPLANT
CLIP VESOCCLUDE SM WIDE 24/CT (CLIP) IMPLANT
CLIP VESOCCLUDE SM WIDE 6/CT (CLIP) ×1 IMPLANT
COVER PROBE W GEL 5X96 (DRAPES) ×2 IMPLANT
COVER WAND RF STERILE (DRAPES) ×1 IMPLANT
DERMABOND ADVANCED (GAUZE/BANDAGES/DRESSINGS) ×1
DERMABOND ADVANCED .7 DNX12 (GAUZE/BANDAGES/DRESSINGS) ×1 IMPLANT
ELECT REM PT RETURN 9FT ADLT (ELECTROSURGICAL) ×2
ELECTRODE REM PT RTRN 9FT ADLT (ELECTROSURGICAL) ×1 IMPLANT
GLOVE BIOGEL PI IND STRL 7.5 (GLOVE) ×1 IMPLANT
GLOVE BIOGEL PI INDICATOR 7.5 (GLOVE) ×1
GLOVE SURG SS PI 7.5 STRL IVOR (GLOVE) ×2 IMPLANT
GOWN STRL REUS W/ TWL LRG LVL3 (GOWN DISPOSABLE) ×2 IMPLANT
GOWN STRL REUS W/ TWL XL LVL3 (GOWN DISPOSABLE) ×1 IMPLANT
GOWN STRL REUS W/TWL LRG LVL3 (GOWN DISPOSABLE) ×4
GOWN STRL REUS W/TWL XL LVL3 (GOWN DISPOSABLE) ×2
HEMOSTAT SNOW SURGICEL 2X4 (HEMOSTASIS) IMPLANT
KIT BASIN OR (CUSTOM PROCEDURE TRAY) ×2 IMPLANT
KIT TURNOVER KIT B (KITS) ×2 IMPLANT
NS IRRIG 1000ML POUR BTL (IV SOLUTION) ×2 IMPLANT
PACK CV ACCESS (CUSTOM PROCEDURE TRAY) ×2 IMPLANT
PAD ARMBOARD 7.5X6 YLW CONV (MISCELLANEOUS) ×4 IMPLANT
SUT PROLENE 6 0 CC (SUTURE) ×3 IMPLANT
SUT SILK 2 0 SH (SUTURE) IMPLANT
SUT VIC AB 3-0 SH 27 (SUTURE) ×2
SUT VIC AB 3-0 SH 27X BRD (SUTURE) ×1 IMPLANT
SUT VICRYL 4-0 PS2 18IN ABS (SUTURE) ×2 IMPLANT
TOWEL GREEN STERILE (TOWEL DISPOSABLE) ×2 IMPLANT
UNDERPAD 30X30 (UNDERPADS AND DIAPERS) ×2 IMPLANT
WATER STERILE IRR 1000ML POUR (IV SOLUTION) ×2 IMPLANT

## 2018-06-05 NOTE — Anesthesia Preprocedure Evaluation (Addendum)
Anesthesia Evaluation  Patient identified by MRN, date of birth, ID band Patient awake    Reviewed: Allergy & Precautions, NPO status , Patient's Chart, lab work & pertinent test results, reviewed documented beta blocker date and time   History of Anesthesia Complications Negative for: history of anesthetic complications  Airway Mallampati: I  TM Distance: >3 FB Neck ROM: Full    Dental  (+) Teeth Intact   Pulmonary sleep apnea ,    breath sounds clear to auscultation       Cardiovascular hypertension, Pt. on medications and Pt. on home beta blockers  Rhythm:Regular     Neuro/Psych Anxiety negative neurological ROS     GI/Hepatic negative GI ROS, Neg liver ROS,   Endo/Other    Renal/GU CRFRenal disease     Musculoskeletal   Abdominal   Peds  Hematology  (+) anemia ,   Anesthesia Other Findings Tte: - Left ventricle: The cavity size was normal. Systolic function was   normal. The estimated ejection fraction was in the range of 50%   to 55%. Diffuse hypokinesis. Doppler parameters are consistent   with abnormal left ventricular relaxation (grade 1 diastolic   dysfunction). Doppler parameters are consistent with high   ventricular filling pressure. - Aortic valve: There was mild regurgitation. - Aorta: Ascending aortic diameter: 43 mm (S). - Ascending aorta: The ascending aorta was mildly dilated. - Left atrium: The atrium was mildly dilated. - Right ventricle: The cavity size was mildly dilated. Wall   thickness was normal. Systolic function was moderately reduced.  Reproductive/Obstetrics                            Anesthesia Physical Anesthesia Plan  ASA: IV  Anesthesia Plan: MAC   Post-op Pain Management:    Induction: Intravenous  PONV Risk Score and Plan: 1 and Treatment may vary due to age or medical condition and Ondansetron  Airway Management Planned: Nasal  Cannula  Additional Equipment: None  Intra-op Plan:   Post-operative Plan:   Informed Consent: I have reviewed the patients History and Physical, chart, labs and discussed the procedure including the risks, benefits and alternatives for the proposed anesthesia with the patient or authorized representative who has indicated his/her understanding and acceptance.   Dental advisory given  Plan Discussed with: CRNA and Surgeon  Anesthesia Plan Comments:         Anesthesia Quick Evaluation

## 2018-06-05 NOTE — Discharge Instructions (Signed)
° °  [facility]  Discharge Instructions  AV Fistula or Graft Surgery for Dialysis Access  Please refer to the following instructions for your post-procedure care. Your surgeon or physician assistant will discuss any changes with you.  Activity  You may drive the day following your surgery, if you are comfortable and no longer taking prescription pain medication. Resume full activity as the soreness in your incision resolves.  Bathing/Showering  You may shower after you go home. Keep your incision dry for 48 hours. Do not soak in a bathtub, hot tub, or swim until the incision heals completely. You may not shower if you have a hemodialysis catheter.  Incision Care  Clean your incision with mild soap and water after 48 hours. Pat the area dry with a clean towel. You do not need a bandage unless otherwise instructed. Do not apply any ointments or creams to your incision. You may have skin glue on your incision. Do not peel it off. It will come off on its own in about one week. Your arm may swell a bit after surgery. To reduce swelling use pillows to elevate your arm so it is above your heart. Your doctor will tell you if you need to lightly wrap your arm with an ACE bandage.  Diet  Resume your normal diet. There are not special food restrictions following this procedure. In order to heal from your surgery, it is CRITICAL to get adequate nutrition. Your body requires vitamins, minerals, and protein. Vegetables are the best source of vitamins and minerals. Vegetables also provide the perfect balance of protein. Processed food has little nutritional value, so try to avoid this.  Medications  Resume taking all of your medications. If your incision is causing pain, you may take over-the counter pain relievers such as acetaminophen (Tylenol). If you were prescribed a stronger pain medication, please be aware these medications can cause nausea and constipation. Prevent  nausea by taking the medication with a snack or meal. Avoid constipation by drinking plenty of fluids and eating foods with high amount of fiber, such as fruits, vegetables, and grains.  Do not take Tylenol if you are taking prescription pain medications.  Follow up Your surgeon may want to see you in the office following your access surgery. If so, this will be arranged at the time of your surgery.  Please call us immediately for any of the following conditions:  Increased pain, redness, drainage (pus) from your incision site Fever of 101 degrees or higher Severe or worsening pain at your incision site Hand pain or numbness.  Reduce your risk of vascular disease:  Stop smoking. If you would like help, call [facility] at 1-800-QUIT-NOW 936-237-8236) or [place] at [place] your cholesterol Maintain a desired weight Control your diabetes Keep your blood pressure down  Dialysis  It will take several weeks to several months for your new dialysis access to be ready for use. Your surgeon will determine when it is okay to use it. Your nephrologist will continue to direct your dialysis. You can continue to use your Permcath until your new access is ready for use.   06/05/2018 [name] [id number] March 30, 1956  Surgeon(s): Serafina Mitchell, MD  Procedure(s): FIRST STAGE BASILIC VEIN TRANSPOSITION LEFT ARM  x Do not stick fistula for 12 weeks    If you have any questions, please call the office at [phone].

## 2018-06-05 NOTE — Anesthesia Postprocedure Evaluation (Signed)
Anesthesia Post Note  Patient: [name]  Procedure(s) Performed: FIRST STAGE BASILIC VEIN TRANSPOSITION LEFT ARM (Left Arm Upper)     Patient location during evaluation: PACU Anesthesia Type: MAC Level of consciousness: awake and alert Pain management: pain level controlled Vital Signs Assessment: post-procedure vital signs reviewed and stable Respiratory status: spontaneous breathing, nonlabored ventilation, respiratory function stable and patient connected to nasal cannula oxygen Cardiovascular status: stable and blood pressure returned to baseline Postop Assessment: no apparent nausea or vomiting Anesthetic complications: no    Last Vitals:  Vitals:   06/05/18 1136 06/05/18 1145  BP:  129/80  Pulse: 64   Resp: 20   Temp:  (!) 36.1 C  SpO2: 100%     Last Pain:  Vitals:   06/05/18 1136  TempSrc:   PainSc: 0-No pain                 Aleya Durnell

## 2018-06-05 NOTE — Transfer of Care (Signed)
Immediate Anesthesia Transfer of Care Note  Patient: [name]  Procedure(s) Performed: FIRST STAGE BASILIC VEIN TRANSPOSITION LEFT ARM (Left Arm Upper)  Patient Location: PACU  Anesthesia Type:MAC  Level of Consciousness: drowsy and patient cooperative  Airway & Oxygen Therapy: Patient Spontanous Breathing  Post-op Assessment: Report given to RN and Post -op Vital signs reviewed and stable  Post vital signs: Reviewed and stable  Last Vitals:  Vitals Value Taken Time  BP 124/85 06/05/2018 11:18 AM  Temp    Pulse 64 06/05/2018 11:18 AM  Resp 18 06/05/2018 11:18 AM  SpO2 100 % 06/05/2018 11:18 AM  Vitals shown include unvalidated device data.  Last Pain:  Vitals:   06/05/18 0025  TempSrc:   PainSc: 0-No pain         Complications: No apparent anesthesia complications

## 2018-06-05 NOTE — Op Note (Signed)
    Patient name: [name] MRN: [id number] DOB: 1956-05-24 Sex: male  06/05/2018 Pre-operative Diagnosis: CKD 4 Post-operative diagnosis:  Same Surgeon:  [name] Assistants: Aldona Bar Ryne Procedure:   First stage left basilic vein fistula Anesthesia: MAC Blood Loss: Minimal  specimens: None  Findings: Healthy 4-5 mm brachial artery.  There were multiple branches of the basilic vein.  I selected a medial branch, which was the largest vein in the antecubital area..  The vein did dilate nicely.  Indications: The patient comes in today for fistula creation.  Preoperative imaging revealed an adequate basilic vein.  Risks and benefits were discussed with the patient.  All questions were answered.  Procedure:  The patient was identified in the holding area and taken to [place] 11  The patient was then placed supine on the table. MAC anesthesia was administered.  The patient was prepped and draped in the usual sterile fashion.  A time out was called and antibiotics were administered.  Ultrasound was used to evaluate the basilic vein in the upper arm.  The vein was of excellent caliber down to about 2 fingerbreadths proximal to the antecubital crease.  At this level, the vein gave off multiple branches.  The largest branch appeared to be the one going medial.  1% lidocaine was used for local anesthesia.  I made a transverse incision at the antecubital crease.  Cautery was used about subcutaneous tissue.  I first dissected out the brachial artery.  This was a disease-free 4-5 mm artery.  It was skeletonized for a distance of approximately 2 cm.  I then dissected out the basilic vein.  I selected the largest branch which was the one coursing medially.  It was fully mobilized and marked for orientation.  All of the tributary branches were ligated between silk ties.  3000 units of heparin was then administered.  The medial branch of the basilic vein was then ligated distally with a 2-0 silk tie.  It  distended nicely with heparin saline the brachial artery was then occluded with vascular clamps and a #11 blade was used to make a arteriotomy.  The vein was then cut to the appropriate length and spatulated to fit the size of the arteriotomy.  A running anastomosis was created with 6-0 Prolene.  Prior to completion the appropriate flushing maneuvers were performed and the anastomosis was completed.  There was an excellent thrill within the fistula.  There were brisk radial and ulnar Doppler signals at the wrist.  25 mg of protamine was then given.  The wound was irrigated.  Hemostasis was achieved.  The incision was then closed with a layer 3 oh and a layer of 4-0 Vicryl followed by Dermabond.  There were no immediate complications.   Disposition: To PACU stable   V. [name], M.D. [facility] Office: [phone] Pager:  [phone]

## 2018-06-05 NOTE — Progress Notes (Signed)
PROGRESS NOTE  Ector Laurel ACZ:[id number] DOB: 1956/05/16 DOA: 05/31/2018 PCP: Binnie Rail, MD   LOS: 5 days   Brief Narrative / Interim history:  62 year old male with history of hypertension, chronic diastolic CHF, chronic kidney disease stage III with baseline creatinine around 2 followed by Dr. Jimmy Footman, hyperlipidemia who came to the hospital with worsening renal failure.  He was seen as an outpatient few weeks ago, felt to be fluid overloaded and started on metolazone.  He took that for about 9 days, fluid status got better but his creatinine went up for which she was admitted.  Subjective: Doping ok No distress No pain in arm where fistula was placed  Assessment & Plan: Principal Problem:   ARF (acute renal failure) (HCC) Active Problems:   Dyslipidemia   Chronic systolic heart failure (HCC)   CKD (chronic kidney disease) stage 4, GFR 15-29 ml/min (HCC)   OSA (obstructive sleep apnea)   Acute on chronic renal failure (HCC)   Principal Problem Acute kidney injury in the setting of chronic kidney disease stage III -Felt to be dehydrated on admission, his diuretics were held and he was placed on IV fluids. -Baseline creatinine around 2 -Nephrology Dr. Moshe Cipro on board-is on currently lasix 160 bid which he will continue as OP  Hypertension -Continue clonidine, hydralazine and labetalol.  Hold ARB and diuretic -Patient does not want amlodipine 2/2 lower extremity swelling with it.  Chronic diastolic CHF -Now on Lasix  Anemia in the setting of chronic kidney disease -Hemoglobin stable, no evidence of bleed -getting IV iron-doesn't wish to use aranesp  Hypokalemia -Remains significantly decreased to 3.0 still -raising concern for Kaliureesis, MAg is normal -defer to nephro further work-up  Scheduled Meds: . aspirin EC  81 mg Oral Daily  . cloNIDine  0.3 mg Oral TID  . darbepoetin (ARANESP) injection - NON-DIALYSIS  100 mcg Subcutaneous Q Tue-1800  .  furosemide  160 mg Oral BID  . heparin  5,000 Units Subcutaneous Q8H  . hydrALAZINE  50 mg Oral TID  . labetalol  200 mg Oral TID  . omega-3 acid ethyl esters  1 g Oral BID  . potassium chloride  40 mEq Oral BID  . ramelteon  8 mg Oral QHS   Continuous Infusions: . sodium chloride 10 mL/hr at 06/05/18 0846  . ferric gluconate (FERRLECIT/NULECIT) IV 250 mg (06/04/18 1216)   PRN Meds:.clonazePAM, oxyCODONE-acetaminophen  DVT prophylaxis: Heparin Code Status: Full code Family Communication: d/w son yesterday Disposition Plan: Home when cleared by nephrology-possibly in am--AVs done  Consultants:   Nephrology  Procedures:   None   Antimicrobials:  None    Objective: Vitals:   06/05/18 1121 06/05/18 1133 06/05/18 1136 06/05/18 1145  BP:  122/82  129/80  Pulse: 66  64   Resp: (!) 22  20   Temp: 97.9 F (36.6 C)   (!) 97 F (36.1 C)  TempSrc:      SpO2: 100%  100%   Weight:      Height:        Intake/Output Summary (Last 24 hours) at 06/05/2018 1411 Last data filed at 06/05/2018 1157 Gross per 24 hour  Intake 925 ml  Output -  Net 925 ml   Filed Weights   05/31/18 0034 06/05/18 0531  Weight: 79.4 kg 82.4 kg    Examination:  Awake pleasant alert no dsitress eomi ncat no pallor no ict abd soft nt nd no rebound no guard No le edema s1 s 2no m/r/g  Psych euthymic Neuro intact  Data Reviewed: I have independently reviewed following labs and imaging studies   CBC: Recent Labs  Lab 05/31/18 0043 05/31/18 1451 06/02/18 0222 06/04/18 0530 06/05/18 0309  WBC 6.5 5.7 5.6 5.4 5.2  HGB 10.2* 9.4* 9.5* 10.4* 9.9*  HCT 31.2* 28.2* 28.7* 32.1* 29.8*  MCV 86.9 85.5 85.7 86.5 85.4  PLT 154 138* 129* 136* 329*   Basic Metabolic Panel: Recent Labs  Lab 05/31/18 0043  06/01/18 0603 06/02/18 0222 06/03/18 0548 06/04/18 0530 06/05/18 0309  NA 136  --  139 139 139 138 140  K 3.0*  --  3.0* 3.0* 2.9* 3.0* 3.0*  CL 81*  --  89* 91* 92* 91* 94*  CO2 32  --   31 29 30 29 30   GLUCOSE 127*  --  115* 116* 114* 110* 115*  BUN 205*  --  195* 188* 169* 162* 159*  CREATININE 11.08*   < > 10.54* 9.83* 8.91* 8.34* 7.41*  CALCIUM 10.6*  --  9.8 9.6 9.8 10.1 9.9  MG 2.8*  --   --   --   --  2.1  --   PHOS 9.1*  --   --   --  6.1* 5.4* 4.8*   < > = values in this interval not displayed.   GFR: Estimated Creatinine Clearance: 12.3 mL/min (A) (by C-G formula based on SCr of 7.41 mg/dL (H)). Liver Function Tests: Recent Labs  Lab 06/03/18 0548 06/04/18 0530 06/05/18 0309  ALBUMIN 3.6 4.0 3.8   No results for input(s): LIPASE, AMYLASE in the last 168 hours. No results for input(s): AMMONIA in the last 168 hours. Coagulation Profile: Recent Labs  Lab 06/05/18 0309  INR 1.15   Cardiac Enzymes: No results for input(s): CKTOTAL, CKMB, CKMBINDEX, TROPONINI in the last 168 hours. BNP (last 3 results) No results for input(s): PROBNP in the last 8760 hours. HbA1C: No results for input(s): HGBA1C in the last 72 hours. CBG: No results for input(s): GLUCAP in the last 168 hours. Lipid Profile: No results for input(s): CHOL, HDL, LDLCALC, TRIG, CHOLHDL, LDLDIRECT in the last 72 hours. Thyroid Function Tests: No results for input(s): TSH, T4TOTAL, FREET4, T3FREE, THYROIDAB in the last 72 hours. Anemia Panel: Recent Labs    06/03/18 0548  FERRITIN 91  TIBC 353  IRON 54   Urine analysis:    Component Value Date/Time   COLORURINE YELLOW 05/31/2018 0040   APPEARANCEUR CLEAR 05/31/2018 0040   LABSPEC 1.011 05/31/2018 0040   PHURINE 5.0 05/31/2018 0040   GLUCOSEU NEGATIVE 05/31/2018 0040   GLUCOSEU NEGATIVE 03/13/2012 0902   HGBUR NEGATIVE 05/31/2018 0040   BILIRUBINUR NEGATIVE 05/31/2018 0040   KETONESUR NEGATIVE 05/31/2018 0040   PROTEINUR NEGATIVE 05/31/2018 0040   UROBILINOGEN 0.2 03/13/2012 0902   NITRITE NEGATIVE 05/31/2018 0040   LEUKOCYTESUR NEGATIVE 05/31/2018 0040   Sepsis Labs: Invalid input(s): PROCALCITONIN, LACTICIDVEN  No  results found for this or any previous visit (from the past 240 hour(s)).    Radiology Studies: Vas [place] Upper Ext Vein Mapping (pre-op Avf)  Result Date: 06/03/2018 UPPER EXTREMITY VEIN MAPPING  Indications: Pre-access. Performing Technologist: Landry Mellow RDMS, RVT  Examination Guidelines: A complete evaluation includes B-mode imaging, spectral Doppler, color Doppler, and power Doppler as needed of all accessible portions of each vessel. Bilateral testing is considered an integral part of a complete examination. Limited examinations for reoccurring indications may be performed as noted. +-----------------+-------------+----------+---------+ Right Cephalic   Diameter (cm)Depth (cm)Findings  +-----------------+-------------+----------+---------+ Shoulder  0.23                         +-----------------+-------------+----------+---------+ Mid upper arm        0.23        0.16             +-----------------+-------------+----------+---------+ Dist upper arm       0.19        0.21             +-----------------+-------------+----------+---------+ Antecubital fossa    0.27                         +-----------------+-------------+----------+---------+ Prox forearm         0.22        0.32   branching +-----------------+-------------+----------+---------+ Mid forearm          0.33        0.22             +-----------------+-------------+----------+---------+ +-----------------+-------------+----------+---------+ Right Basilic    Diameter (cm)Depth (cm)Findings  +-----------------+-------------+----------+---------+ Prox upper arm       0.35        0.29             +-----------------+-------------+----------+---------+ Mid upper arm        0.44        0.21             +-----------------+-------------+----------+---------+ Dist upper arm       0.41        0.28   branching +-----------------+-------------+----------+---------+ Antecubital fossa    0.28                branching +-----------------+-------------+----------+---------+ +-----------------+-------------+----------+---------+ Left Cephalic    Diameter (cm)Depth (cm)Findings  +-----------------+-------------+----------+---------+ Shoulder             0.13                         +-----------------+-------------+----------+---------+ Mid upper arm        0.13                         +-----------------+-------------+----------+---------+ Dist upper arm       0.13                         +-----------------+-------------+----------+---------+ Antecubital fossa    0.16                         +-----------------+-------------+----------+---------+ Prox forearm         0.15               branching +-----------------+-------------+----------+---------+ +-----------------+-------------+----------+---------+ Left Basilic     Diameter (cm)Depth (cm)Findings  +-----------------+-------------+----------+---------+ Prox upper arm       0.50        0.29             +-----------------+-------------+----------+---------+ Mid upper arm        0.41        0.21             +-----------------+-------------+----------+---------+ Dist upper arm       0.48        0.17             +-----------------+-------------+----------+---------+ Antecubital fossa    0.42        0.32   branching +-----------------+-------------+----------+---------+ *See table(s)  above for measurements and observations.  Diagnosing physician: Deitra Mayo MD Electronically signed by Deitra Mayo MD on 06/03/2018 at 4:31:35 PM.    Final       Marzetta Board, MD, PhD [facility] Pager [phone]  If 7PM-7AM, please contact night-coverage [email or web address] Password [email or web address] 06/05/2018, 2:11 PM

## 2018-06-05 NOTE — Progress Notes (Addendum)
Subjective:  No UOP - weight up from admission 2.5 kg - BUN and crt trending down but still markedly abnormal  - overall he says subjectively he is feeling better, appetite back- after discussion with his family he has decided to proceed with AVF placement this hosp. Appreciate [facility] quick response- going for AVF today   Objective Vital signs in last 24 hours: Vitals:   06/04/18 2134 06/04/18 2337 06/05/18 0025 06/05/18 0531  BP: 135/88 (!) 127/104 (!) 138/98   Pulse: 82 80    Resp:  20    Temp: 99.4 F (37.4 C) 98.6 F (37 C)    TempSrc: Oral Oral    SpO2: 100% 99%    Weight:    82.4 kg  Height:       Weight change:   Intake/Output Summary (Last 24 hours) at 06/05/2018 0738 Last data filed at 06/05/2018 0200 Gross per 24 hour  Intake 860 ml  Output -  Net 860 ml    Assessment/ Plan: Pt is a 61 y.o. yo male with advanced CKD at baseline f/b Dr. Jimmy Footman who was admitted on 05/31/2018 with  A on CRF in the setting of diuresis   Assessment/Plan: 1. Renal - A on chronic in the setting of diuresis  - baseline seems to be between  4 and 5.  Has refused to participate in any pre dialysis planning.  He says he was fine until he got that medicine (metolazone) understands he is on the edge but does not want to do dialysis unless absolutely necessary.  Now agrees to get AVF-appreciate their quick response for AVF today.   Truth be told function is trending better even though far from normal and subjectively he reports improvement. Plan eventually as OP will be more frequent labs to monitor even though I am sure he will not consent to HD until he feels badly  2. HTN/volume- do not think is dry given high BP-  stopped IVF - cont clonidine/hydralazine and labetalol- will add back diuretic at full home dose of 160 BID given weight gain  3. Anemia- iron stores low, repleting- also added ESA but pt refused due to side effects -  4. Secondary hyperparathyroidism- last PTH was good as OP- first phos  was 9- recheck 6.1, then 4.8- as improves hopefully would not need binder, will not start one 5. Hypokalemia- have been giving 80 meq daily with really no movement ?  Repeat repletion today- told pt foods high in K that he can partake of at home 6. Dispo- pt seems set on not leaving immediately after AVF- but is planning on d/c tomorrow if nothing changes and surgery goes well     [name]    Labs: Basic Metabolic Panel: Recent Labs  Lab 06/03/18 0548 06/04/18 0530 06/05/18 0309  NA 139 138 140  K 2.9* 3.0* 3.0*  CL 92* 91* 94*  CO2 30 29 30   GLUCOSE 114* 110* 115*  BUN 169* 162* 159*  CREATININE 8.91* 8.34* 7.41*  CALCIUM 9.8 10.1 9.9  PHOS 6.1* 5.4* 4.8*   Liver Function Tests: Recent Labs  Lab 06/03/18 0548 06/04/18 0530 06/05/18 0309  ALBUMIN 3.6 4.0 3.8   No results for input(s): LIPASE, AMYLASE in the last 168 hours. No results for input(s): AMMONIA in the last 168 hours. CBC: Recent Labs  Lab 05/31/18 0043 05/31/18 1451 06/02/18 0222 06/04/18 0530 06/05/18 0309  WBC 6.5 5.7 5.6 5.4 5.2  HGB 10.2* 9.4* 9.5* 10.4* 9.9*  HCT 31.2* 28.2* 28.7* 32.1* 29.8*  MCV 86.9 85.5 85.7 86.5 85.4  PLT 154 138* 129* 136* 125*   Cardiac Enzymes: No results for input(s): CKTOTAL, CKMB, CKMBINDEX, TROPONINI in the last 168 hours. CBG: No results for input(s): GLUCAP in the last 168 hours.  Iron Studies:  Recent Labs    06/03/18 0548  IRON 54  TIBC 353  FERRITIN 91   Studies/Results: Vas [place] Upper Ext Vein Mapping (pre-op Avf)  Result Date: 06/03/2018 UPPER EXTREMITY VEIN MAPPING  Indications: Pre-access. Performing Technologist: Landry Mellow RDMS, RVT  Examination Guidelines: A complete evaluation includes B-mode imaging, spectral Doppler, color Doppler, and power Doppler as needed of all accessible portions of each vessel. Bilateral testing is considered an integral part of a complete examination. Limited examinations for reoccurring indications may be  performed as noted. +-----------------+-------------+----------+---------+ Right Cephalic   Diameter (cm)Depth (cm)Findings  +-----------------+-------------+----------+---------+ Shoulder             0.23                         +-----------------+-------------+----------+---------+ Mid upper arm        0.23        0.16             +-----------------+-------------+----------+---------+ Dist upper arm       0.19        0.21             +-----------------+-------------+----------+---------+ Antecubital fossa    0.27                         +-----------------+-------------+----------+---------+ Prox forearm         0.22        0.32   branching +-----------------+-------------+----------+---------+ Mid forearm          0.33        0.22             +-----------------+-------------+----------+---------+ +-----------------+-------------+----------+---------+ Right Basilic    Diameter (cm)Depth (cm)Findings  +-----------------+-------------+----------+---------+ Prox upper arm       0.35        0.29             +-----------------+-------------+----------+---------+ Mid upper arm        0.44        0.21             +-----------------+-------------+----------+---------+ Dist upper arm       0.41        0.28   branching +-----------------+-------------+----------+---------+ Antecubital fossa    0.28               branching +-----------------+-------------+----------+---------+ +-----------------+-------------+----------+---------+ Left Cephalic    Diameter (cm)Depth (cm)Findings  +-----------------+-------------+----------+---------+ Shoulder             0.13                         +-----------------+-------------+----------+---------+ Mid upper arm        0.13                         +-----------------+-------------+----------+---------+ Dist upper arm       0.13                         +-----------------+-------------+----------+---------+  Antecubital fossa    0.16                         +-----------------+-------------+----------+---------+  Prox forearm         0.15               branching +-----------------+-------------+----------+---------+ +-----------------+-------------+----------+---------+ Left Basilic     Diameter (cm)Depth (cm)Findings  +-----------------+-------------+----------+---------+ Prox upper arm       0.50        0.29             +-----------------+-------------+----------+---------+ Mid upper arm        0.41        0.21             +-----------------+-------------+----------+---------+ Dist upper arm       0.48        0.17             +-----------------+-------------+----------+---------+ Antecubital fossa    0.42        0.32   branching +-----------------+-------------+----------+---------+ *See table(s) above for measurements and observations.  Diagnosing physician: Deitra Mayo MD Electronically signed by Deitra Mayo MD on 06/03/2018 at 4:31:35 PM.    Final    Medications: Infusions: .  ceFAZolin (ANCEF) IV    . ferric gluconate (FERRLECIT/NULECIT) IV 250 mg (06/04/18 1216)    Scheduled Medications: . aspirin EC  81 mg Oral Daily  . cloNIDine  0.3 mg Oral TID  . darbepoetin (ARANESP) injection - NON-DIALYSIS  100 mcg Subcutaneous Q Tue-1800  . furosemide  160 mg Oral BID  . heparin  5,000 Units Subcutaneous Q8H  . hydrALAZINE  50 mg Oral TID  . labetalol  200 mg Oral TID  . omega-3 acid ethyl esters  1 g Oral BID  . ramelteon  8 mg Oral QHS    have reviewed scheduled and prn medications.  Physical Exam: General: NAD Heart: RRR Lungs: mostly clear Abdomen: soft, non tender Extremities: min edema    06/05/2018,7:38 AM  LOS: 5 days

## 2018-06-05 NOTE — Anesthesia Procedure Notes (Signed)
Procedure Name: MAC Date/Time: 06/05/2018 10:11 AM Performed by: Orlie Dakin, CRNA Pre-anesthesia Checklist: Patient identified, Emergency Drugs available, Suction available and Patient being monitored Patient Re-evaluated:Patient Re-evaluated prior to induction Oxygen Delivery Method: Simple face mask Preoxygenation: Pre-oxygenation with 100% oxygen

## 2018-06-06 ENCOUNTER — Telehealth: Payer: Self-pay | Admitting: Surgery

## 2018-06-06 ENCOUNTER — Encounter (HOSPITAL_COMMUNITY): Payer: Self-pay | Admitting: Surgery

## 2018-06-06 LAB — RENAL FUNCTION PANEL
Albumin: 3.6 g/dL (ref 3.5–5.0)
Anion gap: 20 — ABNORMAL HIGH (ref 5–15)
BUN: 156 mg/dL — ABNORMAL HIGH (ref 6–20)
CO2: 27 mmol/L (ref 22–32)
Calcium: 9.6 mg/dL (ref 8.9–10.3)
Chloride: 92 mmol/L — ABNORMAL LOW (ref 98–111)
Creatinine, Ser: 8.06 mg/dL — ABNORMAL HIGH (ref 0.61–1.24)
GFR calc Af Amer: 8 mL/min — ABNORMAL LOW (ref >60)
GFR calc non Af Amer: 7 mL/min — ABNORMAL LOW (ref >60)
Glucose, Bld: 109 mg/dL — ABNORMAL HIGH (ref 70–99)
PHOSPHORUS: 5.5 mg/dL — AB (ref 2.5–4.6)
POTASSIUM: 3.3 mmol/L — AB (ref 3.5–5.1)
Sodium: 139 mmol/L (ref 135–145)

## 2018-06-06 MED ORDER — FUROSEMIDE 80 MG PO TABS: 160.0000 mg | ORAL_TABLET | Freq: Two times a day (BID) | ORAL | 0 refills | Status: DC

## 2018-06-06 NOTE — Progress Notes (Signed)
Pt is discharged per physician orders to home. His son will be driving him home. He has received instructions for medications, follow-up appointments. Pt has no questions at this time. Valuables have been with pt and are all accounted for, per pt.

## 2018-06-06 NOTE — Progress Notes (Signed)
Subjective:  S/P  First stage BVT yesterday - No UOP recorded- - BUN and crt trending basically stable -  still markedly abnormal  - overall he says subjectively he is feeling better, appetite back- for discharge today   Objective Vital signs in last 24 hours: Vitals:   06/05/18 1145 06/05/18 1732 06/05/18 2245 06/06/18 0744  BP: 129/80 138/85 122/78 140/90  Pulse:  85 70 71  Resp:  14 18   Temp: (!) 97 F (36.1 C) 99 F (37.2 C) 98.4 F (36.9 C) 98.3 F (36.8 C)  TempSrc:  Oral Oral Oral  SpO2:  98% 96% 99%  Weight:      Height:       Weight change:   Intake/Output Summary (Last 24 hours) at 06/06/2018 0856 Last data filed at 06/05/2018 1157 Gross per 24 hour  Intake 425 ml  Output -  Net 425 ml    Assessment/ Plan: Pt is a 62 y.o. yo male with advanced CKD at baseline f/b Dr. Jimmy Footman who was admitted on 05/31/2018 with  A on CRF in the setting of diuresis   Assessment/Plan: 1. Renal - A on chronic in the setting of diuresis  - baseline seems to be between  4 and 5.  Had refused to participate in any pre dialysis planning.  He says he was fine until he got that medicine (metolazone) understands he is on the edge but does not want to do dialysis unless absolutely necessary.  Now agrees to get AVF- s/p first stage yest.   Truth be told function is trending better even though far from normal and subjectively he reports improvement. Plan eventually as OP will be more frequent labs to monitor even though I am sure he will not consent to HD until he feels badly  2. HTN/volume- do not think is dry given high BP-  stopped IVF - cont clonidine/hydralazine and labetalol- will added back diuretic at full home dose of 160 BID given weight gain  3. Anemia- iron stores low, repleting- also added ESA but pt refused due to side effects -  4. Secondary hyperparathyroidism- last PTH was good as OP- first phos was 9- recheck 6.1, then 4.8- as improves hopefully would not need binder, will not  start one 5. Hypokalemia- have been giving 80 meq daily with really no movement ?  Finally up some- was on 20 at home- being sent out on 60 daily- will need follow up labs as OP 6. Dispo- d/c today     Louis Meckel    Labs: Basic Metabolic Panel: Recent Labs  Lab 06/04/18 0530 06/05/18 0309 06/06/18 0244  NA 138 140 139  K 3.0* 3.0* 3.3*  CL 91* 94* 92*  CO2 29 30 27   GLUCOSE 110* 115* 109*  BUN 162* 159* 156*  CREATININE 8.34* 7.41* 8.06*  CALCIUM 10.1 9.9 9.6  PHOS 5.4* 4.8* 5.5*   Liver Function Tests: Recent Labs  Lab 06/04/18 0530 06/05/18 0309 06/06/18 0244  ALBUMIN 4.0 3.8 3.6   No results for input(s): LIPASE, AMYLASE in the last 168 hours. No results for input(s): AMMONIA in the last 168 hours. CBC: Recent Labs  Lab 05/31/18 0043 05/31/18 1451 06/02/18 0222 06/04/18 0530 06/05/18 0309  WBC 6.5 5.7 5.6 5.4 5.2  HGB 10.2* 9.4* 9.5* 10.4* 9.9*  HCT 31.2* 28.2* 28.7* 32.1* 29.8*  MCV 86.9 85.5 85.7 86.5 85.4  PLT 154 138* 129* 136* 125*   Cardiac Enzymes: No results for input(s): CKTOTAL, CKMB,  CKMBINDEX, TROPONINI in the last 168 hours. CBG: No results for input(s): GLUCAP in the last 168 hours.  Iron Studies:  No results for input(s): IRON, TIBC, TRANSFERRIN, FERRITIN in the last 72 hours. Studies/Results: No results found. Medications: Infusions: . sodium chloride Stopped (06/05/18 1200)  . ferric gluconate (FERRLECIT/NULECIT) IV Stopped (06/04/18 1500)    Scheduled Medications: . aspirin EC  81 mg Oral Daily  . cloNIDine  0.3 mg Oral TID  . darbepoetin (ARANESP) injection - NON-DIALYSIS  100 mcg Subcutaneous Q Tue-1800  . furosemide  160 mg Oral BID  . heparin  5,000 Units Subcutaneous Q8H  . hydrALAZINE  50 mg Oral TID  . labetalol  200 mg Oral TID  . omega-3 acid ethyl esters  1 g Oral BID  . ramelteon  8 mg Oral QHS    have reviewed scheduled and prn medications.  Physical Exam: General: NAD Heart: RRR Lungs: mostly  clear Abdomen: soft, non tender Extremities: min edema    06/06/2018,8:56 AM  LOS: 6 days

## 2018-06-06 NOTE — Progress Notes (Signed)
   VASCULAR SURGERY ASSESSMENT & PLAN:   1 Day Post-Op s/p: Left first stage basilic vein transposition.  The fistula has an excellent thrill.  He has a palpable radial pulse.  He will follow-up with Dr. Trula Slade in 6 weeks.  Okay for discharge from our standpoint.  Vascular surgery will be available as needed.  SUBJECTIVE:   No complaints.  PHYSICAL EXAM:   Vitals:   06/05/18 1136 06/05/18 1145 06/05/18 1732 06/05/18 2245  BP:  129/80 138/85 122/78  Pulse: 64  85 70  Resp: 20  14 18   Temp:  (!) 97 F (36.1 C) 99 F (37.2 C) 98.4 F (36.9 C)  TempSrc:   Oral Oral  SpO2: 100%  98% 96%  Weight:      Height:       Excellent thrill in left upper arm fistula Incision looks fine. Palpable left radial pulse.`  LABS:   PROBLEM LIST:    Principal Problem:   ARF (acute renal failure) (HCC) Active Problems:   Dyslipidemia   Chronic systolic heart failure (HCC)   CKD (chronic kidney disease) stage 4, GFR 15-29 ml/min (HCC)   OSA (obstructive sleep apnea)   Acute on chronic renal failure (HCC)   CURRENT MEDS:   . aspirin EC  81 mg Oral Daily  . cloNIDine  0.3 mg Oral TID  . darbepoetin (ARANESP) injection - NON-DIALYSIS  100 mcg Subcutaneous Q Tue-1800  . furosemide  160 mg Oral BID  . heparin  5,000 Units Subcutaneous Q8H  . hydrALAZINE  50 mg Oral TID  . labetalol  200 mg Oral TID  . omega-3 acid ethyl esters  1 g Oral BID  . ramelteon  8 mg Oral QHS    Deitra Mayo Beeper: [phone] Office: [phone] 06/06/2018

## 2018-06-06 NOTE — Discharge Summary (Signed)
Physician Discharge Summary  [name] DTH:[id number] DOB: [birth year] DOA: 05/31/2018  PCP: Binnie Rail, MD  Admit date: 05/31/2018 Discharge date: 06/06/2018  Time spent: 25 minutes  Recommendations for Outpatient Follow-up:  1. Patient will need close follow-up with Dr. Dr. Mina Marble of nephrology and see imminently will need dialysis 2. Needs renal panel in 1 week 3. Needs recheck of iron stores about 3 weeks 4. Note dosage change of Lasix and discontinuation of metolazone this admission  Discharge Diagnoses:  Principal Problem:   ARF (acute renal failure) (HCC) Active Problems:   Dyslipidemia   Chronic systolic heart failure (HCC)   CKD (chronic kidney disease) stage 4, GFR 15-29 ml/min (HCC)   OSA (obstructive sleep apnea)   Acute on chronic renal failure [facility])   Discharge Condition: Improved  Diet recommendation: Renal  Filed Weights   05/31/18 0034 06/05/18 0531  Weight: 79.4 kg 82.4 kg    History of present illness:  62 year old male with history of hypertension, chronic diastolic CHF, chronic kidney disease stage III with baseline creatinine around 2 followed by Dr. Jimmy Footman, hyperlipidemia who came to the hospital with worsening renal failure.  He was seen as an outpatient few weeks ago, felt to be fluid overloaded and started on metolazone.  He took that for about 9 days, fluid status got better but his creatinine went up for which she was admitted.  Hospital Course:  Acute kidney injury in the setting of chronic kidney disease stage III -Felt to be dehydrated on admission, his diuretics were held and he was placed on IV fluids. -Baseline creatinine around 2 -Nephrology Dr. Moshe Cipro on board-is on currently lasix 160 bid which he will continue as OP -Finally agreed to having fistula which was placed 12/19 Have emphasized patient will need labs in the next week or so and that he is dialysis bound and may need to have this sooner than  later  Hypertension -Continue clonidine, hydralazine and labetalol.  Hold ARB on discharge -Patient does not want amlodipine 2/2 lower extremity swelling with it.  Chronic diastolic CHF -Now on Lasix as directed by nephrologist  Anemia in the setting of chronic kidney disease -Hemoglobin stable, no evidence of bleed -getting IV iron-doesn't wish to use aranesp  Hypokalemia -Remains significantly decreased to 3.3 and steadily improving  Procedures: Left AV fistula placed 12/19 Consultations:  Nephrology  Vascular surgery  Discharge Exam: Vitals:   06/05/18 2245 06/06/18 0744  BP: 122/78 140/90  Pulse: 70 71  Resp: 18   Temp: 98.4 F (36.9 C) 98.3 F (36.8 C)  SpO2: 96% 99%    General: Awake alert pleasant eating breakfast no distress no shortness of breath passing good urine Cardiovascular: Clinically clear no added sound S1-S2 no murmur rub or gallop Abdomen soft nontender no rebound No lower extremity edema Discharge Instructions   Discharge Instructions    Diet - low sodium heart healthy   Complete by:  As directed    Discharge instructions   Complete by:  As directed    Make sure you follow-up with nephrology in about 1 week take all meds as previously indicated note that there is a dosage change of your Lasix Please notify a doctor if you have failed to pass urine or have a decreased flow   Increase activity slowly   Complete by:  As directed      Allergies as of 06/06/2018      Reactions   Amlodipine Swelling   Metolazone Other (See Comments)  Made kidney function worse      Medication List    STOP taking these medications   amLODipine 5 MG tablet Commonly known as:  NORVASC   clonazePAM 0.5 MG tablet Commonly known as:  KLONOPIN   loratadine 10 MG tablet Commonly known as:  CLARITIN   potassium chloride SA 20 MEQ tablet Commonly known as:  KLOR-CON M20   telmisartan 80 MG tablet Commonly known as:  MICARDIS     TAKE these  medications   allopurinol 100 MG tablet Commonly known as:  ZYLOPRIM Take 200 mg by mouth daily.   aspirin EC 81 MG tablet Take 81 mg by mouth daily.   calcitRIOL 0.5 MCG capsule Commonly known as:  ROCALTROL Take 1 capsule (0.5 mcg total) by mouth daily.   cloNIDine 0.3 MG tablet Commonly known as:  CATAPRES TAKE 1 TABLET(0.3 MG) BY MOUTH THREE TIMES DAILY What changed:    See the new instructions.  Another medication with the same name was removed. Continue taking this medication, and follow the directions you see here.   Fish Oil 1200 MG Cpdr Take 1 capsule by mouth 2 (two) times daily.   furosemide 80 MG tablet Commonly known as:  LASIX Take 2 tablets (160 mg total) by mouth 2 (two) times daily. What changed:    how much to take  additional instructions   hydrALAZINE 50 MG tablet Commonly known as:  APRESOLINE Take 1 tablet (50 mg total) by mouth 3 (three) times daily.   labetalol 200 MG tablet Commonly known as:  NORMODYNE Take 1 tablet (200 mg total) by mouth 3 (three) times daily. --patient has scheduled may/2017 appt with new pcp-dr burns   sildenafil 100 MG tablet Commonly known as:  VIAGRA Take 1 tablet (100 mg total) by mouth as needed for erectile dysfunction.      Allergies  Allergen Reactions  . Amlodipine Swelling  . Metolazone Other (See Comments)    Made kidney function worse   Follow-up Information    Serafina Mitchell, MD In 6 weeks.   Specialties:  Vascular Surgery, Cardiology Why:  Office will call you to arrange your appt (sent) Contact information: [place] [address] [phone]            The results of significant diagnostics from this hospitalization (including imaging, microbiology, ancillary and laboratory) are listed below for reference.    Significant Diagnostic Studies: US Renal  Result Date: 05/31/2018 CLINICAL DATA:  Acute renal failure.  Chronic kidney disease. EXAM: RENAL / URINARY TRACT  ULTRASOUND COMPLETE COMPARISON:  12/19/2008 FINDINGS: Right Kidney: Renal measurements: 9.4 x 3.5 x 5.1 cm. = volume: 88.6. mL. No mass or hydronephrosis. Increased echogenicity. Left Kidney: Renal measurements: 10.3 x 4.6 x 4.7 cm = volume: 107.8 mL. Increased parenchymal echogenicity. Tiny cyst within the upper pole of left kidney measures 5 x 4 x 4 mm. Bladder: Appears normal for degree of bladder distention. IMPRESSION: 1. Bilateral echogenic kidneys consistent with chronic medical renal disease. 2. No hydronephrosis. Electronically Signed   By: Kerby Moors M.D.   On: 05/31/2018 07:22   Vas [place] Upper Ext Vein Mapping (pre-op Avf)  Result Date: 06/03/2018 UPPER EXTREMITY VEIN MAPPING  Indications: Pre-access. Performing Technologist: Landry Mellow RDMS, RVT  Examination Guidelines: A complete evaluation includes B-mode imaging, spectral Doppler, color Doppler, and power Doppler as needed of all accessible portions of each vessel. Bilateral testing is considered an integral part of a complete examination. Limited examinations for reoccurring  indications may be performed as noted. +-----------------+-------------+----------+---------+ Right Cephalic   Diameter (cm)Depth (cm)Findings  +-----------------+-------------+----------+---------+ Shoulder             0.23                         +-----------------+-------------+----------+---------+ Mid upper arm        0.23        0.16             +-----------------+-------------+----------+---------+ Dist upper arm       0.19        0.21             +-----------------+-------------+----------+---------+ Antecubital fossa    0.27                         +-----------------+-------------+----------+---------+ Prox forearm         0.22        0.32   branching +-----------------+-------------+----------+---------+ Mid forearm          0.33        0.22             +-----------------+-------------+----------+---------+  +-----------------+-------------+----------+---------+ Right Basilic    Diameter (cm)Depth (cm)Findings  +-----------------+-------------+----------+---------+ Prox upper arm       0.35        0.29             +-----------------+-------------+----------+---------+ Mid upper arm        0.44        0.21             +-----------------+-------------+----------+---------+ Dist upper arm       0.41        0.28   branching +-----------------+-------------+----------+---------+ Antecubital fossa    0.28               branching +-----------------+-------------+----------+---------+ +-----------------+-------------+----------+---------+ Left Cephalic    Diameter (cm)Depth (cm)Findings  +-----------------+-------------+----------+---------+ Shoulder             0.13                         +-----------------+-------------+----------+---------+ Mid upper arm        0.13                         +-----------------+-------------+----------+---------+ Dist upper arm       0.13                         +-----------------+-------------+----------+---------+ Antecubital fossa    0.16                         +-----------------+-------------+----------+---------+ Prox forearm         0.15               branching +-----------------+-------------+----------+---------+ +-----------------+-------------+----------+---------+ Left Basilic     Diameter (cm)Depth (cm)Findings  +-----------------+-------------+----------+---------+ Prox upper arm       0.50        0.29             +-----------------+-------------+----------+---------+ Mid upper arm        0.41        0.21             +-----------------+-------------+----------+---------+ Dist upper arm       0.48        0.17             +-----------------+-------------+----------+---------+  Antecubital fossa    0.42        0.32   branching +-----------------+-------------+----------+---------+ *See table(s) above for  measurements and observations.  Diagnosing physician: Deitra Mayo MD Electronically signed by Deitra Mayo MD on 06/03/2018 at 4:31:35 PM.    Final     Microbiology: No results found for this or any previous visit (from the past 240 hour(s)).   Labs: Basic Metabolic Panel: Recent Labs  Lab 05/31/18 0043  06/02/18 0222 06/03/18 0548 06/04/18 0530 06/05/18 0309 06/06/18 0244  NA 136   < > 139 139 138 140 139  K 3.0*   < > 3.0* 2.9* 3.0* 3.0* 3.3*  CL 81*   < > 91* 92* 91* 94* 92*  CO2 32   < > 29 30 29 30 27   GLUCOSE 127*   < > 116* 114* 110* 115* 109*  BUN 205*   < > 188* 169* 162* 159* 156*  CREATININE 11.08*   < > 9.83* 8.91* 8.34* 7.41* 8.06*  CALCIUM 10.6*   < > 9.6 9.8 10.1 9.9 9.6  MG 2.8*  --   --   --  2.1  --   --   PHOS 9.1*  --   --  6.1* 5.4* 4.8* 5.5*   < > = values in this interval not displayed.   Liver Function Tests: Recent Labs  Lab 06/03/18 0548 06/04/18 0530 06/05/18 0309 06/06/18 0244  ALBUMIN 3.6 4.0 3.8 3.6   No results for input(s): LIPASE, AMYLASE in the last 168 hours. No results for input(s): AMMONIA in the last 168 hours. CBC: Recent Labs  Lab 05/31/18 0043 05/31/18 1451 06/02/18 0222 06/04/18 0530 06/05/18 0309  WBC 6.5 5.7 5.6 5.4 5.2  HGB 10.2* 9.4* 9.5* 10.4* 9.9*  HCT 31.2* 28.2* 28.7* 32.1* 29.8*  MCV 86.9 85.5 85.7 86.5 85.4  PLT 154 138* 129* 136* 125*   Cardiac Enzymes: No results for input(s): CKTOTAL, CKMB, CKMBINDEX, TROPONINI in the last 168 hours. BNP: BNP (last 3 results) No results for input(s): BNP in the last 8760 hours.  ProBNP (last 3 results) No results for input(s): PROBNP in the last 8760 hours.  CBG: No results for input(s): GLUCAP in the last 168 hours.     Signed:  Nita Sells MD   [facility] 06/06/2018, 8:28 AM

## 2018-06-06 NOTE — Telephone Encounter (Signed)
-----   Message from Mena Goes, RN sent at 06/05/2018 11:32 AM EST ----- Regarding: 4-6 weeks  ----- Message ----- From: Natividad Brood Sent: 06/05/2018  11:14 AM EST To: [facility] Admin Pool, [facility] Charge Pool  S/p left 1st stage BVT 06/05/18.  F/u in PA clinic on Dr. Stephens Shire office day when he is in the office with duplex.  Thanks

## 2018-06-06 NOTE — Telephone Encounter (Signed)
sch appt lvm mld ltr 07/14/2018 2pm Dialysis Duplex 3pm p/o PA

## 2018-06-06 NOTE — Care Management Note (Signed)
Case Management Note  Patient Details  Name: Stanley Lyness MRN: [id number] Date of Birth: 1955-12-27  Subjective/Objective:                    Action/Plan: Pt discharging home with self care. Pt has PcP, insurance and transportation home.   Expected Discharge Date:  06/06/18               Expected Discharge Plan:  Home/Self Care  In-House Referral:     Discharge planning Services     Post Acute Care Choice:    Choice offered to:     DME Arranged:    DME Agency:     HH Arranged:    HH Agency:     Status of Service:  Completed, signed off  If discussed at [employer] of Stay Meetings, dates discussed:    Additional Comments:  Pollie Friar, RN 06/06/2018, 10:48 AM

## 2018-06-12 DIAGNOSIS — I5030 Unspecified diastolic (congestive) heart failure: Secondary | ICD-10-CM | POA: Diagnosis not present

## 2018-06-12 DIAGNOSIS — I132 Hypertensive heart and chronic kidney disease with heart failure and with stage 5 chronic kidney disease, or end stage renal disease: Secondary | ICD-10-CM | POA: Diagnosis not present

## 2018-06-12 DIAGNOSIS — N179 Acute kidney failure, unspecified: Secondary | ICD-10-CM | POA: Diagnosis not present

## 2018-06-12 DIAGNOSIS — N185 Chronic kidney disease, stage 5: Secondary | ICD-10-CM | POA: Diagnosis not present

## 2018-06-20 ENCOUNTER — Other Ambulatory Visit: Payer: Self-pay

## 2018-06-20 DIAGNOSIS — N184 Chronic kidney disease, stage 4 (severe): Secondary | ICD-10-CM

## 2018-06-27 DIAGNOSIS — N179 Acute kidney failure, unspecified: Secondary | ICD-10-CM | POA: Diagnosis not present

## 2018-07-02 DIAGNOSIS — R0602 Shortness of breath: Secondary | ICD-10-CM | POA: Diagnosis not present

## 2018-07-02 DIAGNOSIS — G4701 Insomnia due to medical condition: Secondary | ICD-10-CM | POA: Diagnosis not present

## 2018-07-14 ENCOUNTER — Encounter (HOSPITAL_COMMUNITY): Payer: 59

## 2018-07-18 ENCOUNTER — Ambulatory Visit (INDEPENDENT_AMBULATORY_CARE_PROVIDER_SITE_OTHER): Payer: 59 | Admitting: Physician Assistant

## 2018-07-18 ENCOUNTER — Other Ambulatory Visit: Payer: Self-pay

## 2018-07-18 ENCOUNTER — Encounter: Payer: Self-pay | Admitting: Surgery

## 2018-07-18 ENCOUNTER — Ambulatory Visit (HOSPITAL_COMMUNITY)
Admission: RE | Admit: 2018-07-18 | Discharge: 2018-07-18 | Disposition: A | Discharge status: Home / Self Care | Payer: 59 | Source: Ambulatory Visit | Attending: Surgery | Admitting: Surgery

## 2018-07-18 ENCOUNTER — Other Ambulatory Visit: Payer: Self-pay | Admitting: Surgery

## 2018-07-18 ENCOUNTER — Encounter: Payer: Self-pay | Admitting: Family

## 2018-07-18 VITALS — BP 137/98 | HR 95 | Temp 97.0°F | Resp 16 | Ht 74.0 in | Wt 175.0 lb

## 2018-07-18 DIAGNOSIS — N184 Chronic kidney disease, stage 4 (severe): Secondary | ICD-10-CM

## 2018-07-18 NOTE — H&P (View-Only) (Signed)
    Postoperative Access Visit   History of Present Illness   [name] is a 63 y.o. year old male who presents for postoperative follow-up for: left first stage basilic vein transposition by Dr. Trula Slade (Date: 06/06/19).  The patient's wounds are  healed.  The patient denies steal symptoms.  The patient is able to complete their activities of daily living.  He is not yet on hemodialysis however admits that feet and lower legs have been more edematous lately.  He denies SOB, also denies signs or symptoms of uremia.  He does not take blood thinners.   Physical Examination   Vitals:   07/18/18 1446  BP: (!) 137/98  Pulse: 95  Resp: 16  Temp: (!) 97 F (36.1 C)  TempSrc: Oral  SpO2: 100%  Weight: 175 lb (79.4 kg)  Height: 6\' 2"  (1.88 m)   Body mass index is 22.47 kg/m.  left arm Incision is healed, hand grip is 5/5, sensation in digits is intact, palpable thrill, bruit can be auscultated; palpable L radial pulse     Medical Decision Making   [name] is a 63 y.o. year old male who presents s/p left second stage basilic vein transposition    Patent fistula without signs or symptoms of steal  Plan is to proceed with 2nd stage basilic vein transposition with Dr. Trula Slade Risk, benefits, and alternatives to access surgery were discussed.   The patient is aware the risks include but are not limited to: bleeding, infection, steal syndrome, nerve damage, thrombosis, need for additional procedures The patient agrees to proceed forward with the procedure.   Alex Ligas PA-C [facility] [place] Office: [phone]

## 2018-07-18 NOTE — Progress Notes (Signed)
    Postoperative Access Visit   History of Present Illness   [name] is a 63 y.o. year old male who presents for postoperative follow-up for: left first stage basilic vein transposition by Dr. Trula Slade (Date: 06/06/19).  The patient's wounds are  healed.  The patient denies steal symptoms.  The patient is able to complete their activities of daily living.  He is not yet on hemodialysis however admits that feet and lower legs have been more edematous lately.  He denies SOB, also denies signs or symptoms of uremia.  He does not take blood thinners.   Physical Examination   Vitals:   07/18/18 1446  BP: (!) 137/98  Pulse: 95  Resp: 16  Temp: (!) 97 F (36.1 C)  TempSrc: Oral  SpO2: 100%  Weight: 175 lb (79.4 kg)  Height: 6\' 2"  (1.88 m)   Body mass index is 22.47 kg/m.  left arm Incision is healed, hand grip is 5/5, sensation in digits is intact, palpable thrill, bruit can be auscultated; palpable L radial pulse     Medical Decision Making   [name] is a 63 y.o. year old male who presents s/p left second stage basilic vein transposition    Patent fistula without signs or symptoms of steal  Plan is to proceed with 2nd stage basilic vein transposition with Dr. Trula Slade Risk, benefits, and alternatives to access surgery were discussed.   The patient is aware the risks include but are not limited to: bleeding, infection, steal syndrome, nerve damage, thrombosis, need for additional procedures The patient agrees to proceed forward with the procedure.   Alex Ligas PA-C [facility] [place] Office: [phone]

## 2018-07-24 ENCOUNTER — Encounter (HOSPITAL_COMMUNITY): Payer: Self-pay | Admitting: *Deleted

## 2018-07-24 ENCOUNTER — Other Ambulatory Visit: Payer: Self-pay

## 2018-07-24 NOTE — Progress Notes (Signed)
SDW call completed. Patient had c/o cough at night with Flem that is clear and sometimes pink in color.  Spoke with Becky at Dr. Aleen Campi office and he will need to be evaluated the day of surgery. Jeneen Rinks PA is aware

## 2018-07-24 NOTE — Anesthesia Preprocedure Evaluation (Addendum)
Anesthesia Evaluation  Patient identified by MRN, date of birth, ID band Patient awake    Reviewed: Allergy & Precautions, NPO status , Patient's Chart, lab work & pertinent test results, reviewed documented beta blocker date and time   Airway Mallampati: II  TM Distance: >3 FB Neck ROM: Full    Dental no notable dental hx.    Pulmonary sleep apnea ,    Pulmonary exam normal breath sounds clear to auscultation       Cardiovascular hypertension, Pt. on medications and Pt. on home beta blockers +CHF  Normal cardiovascular exam Rhythm:Regular Rate:Normal  Chronic systolic heart failure  ECHO: (2016) LV EF: 50% -   55%    Neuro/Psych Anxiety negative neurological ROS     GI/Hepatic negative GI ROS, Neg liver ROS,   Endo/Other  negative endocrine ROS  Renal/GU ESRFRenal disease     Musculoskeletal negative musculoskeletal ROS (+)   Abdominal   Peds  Hematology  (+) anemia , DYSLIPIDEMIA Gout   Anesthesia Other Findings STAGE FOUR CHRONIC KIDNEY DISEASE  Reproductive/Obstetrics                            Anesthesia Physical Anesthesia Plan  ASA: III  Anesthesia Plan: General   Post-op Pain Management:    Induction: Intravenous  PONV Risk Score and Plan: 2 and Ondansetron, Dexamethasone and Treatment may vary due to age or medical condition  Airway Management Planned: LMA  Additional Equipment:   Intra-op Plan:   Post-operative Plan: Extubation in OR  Informed Consent: I have reviewed the patients History and Physical, chart, labs and discussed the procedure including the risks, benefits and alternatives for the proposed anesthesia with the patient or authorized representative who has indicated his/her understanding and acceptance.     Dental advisory given  Plan Discussed with: CRNA  Anesthesia Plan Comments: (M and M: acetaminophen and dexamethasone )         Anesthesia Quick Evaluation

## 2018-07-25 ENCOUNTER — Encounter (HOSPITAL_COMMUNITY)
Admission: RE | Disposition: A | Discharge status: Home / Self Care | Payer: Self-pay | Source: Ambulatory Visit | Attending: Surgery

## 2018-07-25 ENCOUNTER — Ambulatory Visit (HOSPITAL_COMMUNITY)
Admission: RE | Admit: 2018-07-25 | Discharge: 2018-07-25 | Disposition: A | Discharge status: Home / Self Care | Payer: 59 | Source: Ambulatory Visit | Attending: Surgery | Admitting: Surgery

## 2018-07-25 ENCOUNTER — Encounter (HOSPITAL_COMMUNITY): Payer: Self-pay | Admitting: *Deleted

## 2018-07-25 ENCOUNTER — Ambulatory Visit (HOSPITAL_COMMUNITY): Payer: 59 | Admitting: Anesthesiology

## 2018-07-25 DIAGNOSIS — G4730 Sleep apnea, unspecified: Secondary | ICD-10-CM | POA: Insufficient documentation

## 2018-07-25 DIAGNOSIS — I5022 Chronic systolic (congestive) heart failure: Secondary | ICD-10-CM | POA: Diagnosis not present

## 2018-07-25 DIAGNOSIS — Z79899 Other long term (current) drug therapy: Secondary | ICD-10-CM | POA: Insufficient documentation

## 2018-07-25 DIAGNOSIS — N185 Chronic kidney disease, stage 5: Secondary | ICD-10-CM

## 2018-07-25 DIAGNOSIS — N184 Chronic kidney disease, stage 4 (severe): Secondary | ICD-10-CM | POA: Diagnosis not present

## 2018-07-25 DIAGNOSIS — D631 Anemia in chronic kidney disease: Secondary | ICD-10-CM | POA: Diagnosis not present

## 2018-07-25 DIAGNOSIS — I130 Hypertensive heart and chronic kidney disease with heart failure and stage 1 through stage 4 chronic kidney disease, or unspecified chronic kidney disease: Secondary | ICD-10-CM | POA: Diagnosis not present

## 2018-07-25 HISTORY — PX: BASCILIC VEIN TRANSPOSITION: SHX5742

## 2018-07-25 HISTORY — PX: THROMBECTOMY W/ EMBOLECTOMY: SHX2507

## 2018-07-25 LAB — POCT I-STAT 4, (NA,K, GLUC, HGB,HCT)
Glucose, Bld: 92 mg/dL (ref 70–99)
HCT: 24 % — ABNORMAL LOW (ref 39.0–52.0)
Hemoglobin: 8.2 g/dL — ABNORMAL LOW (ref 13.0–17.0)
Potassium: 4.6 mmol/L (ref 3.5–5.1)
Sodium: 140 mmol/L (ref 135–145)

## 2018-07-25 SURGERY — TRANSPOSITION, VEIN, BASILIC
Anesthesia: General | Site: Arm Upper | Laterality: Left

## 2018-07-25 MED ORDER — PHENYLEPHRINE 40 MCG/ML (10ML) SYRINGE FOR IV PUSH (FOR BLOOD PRESSURE SUPPORT)
PREFILLED_SYRINGE | INTRAVENOUS | Status: AC
  Filled 2018-07-25: qty 10

## 2018-07-25 MED ORDER — CALCIUM CHLORIDE 10 % IV SOLN
INTRAVENOUS | Status: AC
  Filled 2018-07-25: qty 10

## 2018-07-25 MED ORDER — ALBUMIN HUMAN 5 % IV SOLN
INTRAVENOUS | Status: DC | PRN
  Administered 2018-07-25 (×2): via INTRAVENOUS

## 2018-07-25 MED ORDER — CALCIUM CHLORIDE 10 % IV SOLN
INTRAVENOUS | Status: DC | PRN
  Administered 2018-07-25 (×2): 500 mg via INTRAVENOUS

## 2018-07-25 MED ORDER — EPINEPHRINE PF 1 MG/10ML IJ SOSY
PREFILLED_SYRINGE | INTRAMUSCULAR | Status: AC
  Filled 2018-07-25: qty 10

## 2018-07-25 MED ORDER — PHENYLEPHRINE HCL 10 MG/ML IJ SOLN
INTRAMUSCULAR | Status: AC
  Filled 2018-07-25: qty 1

## 2018-07-25 MED ORDER — GLYCOPYRROLATE PF 0.2 MG/ML IJ SOSY
PREFILLED_SYRINGE | INTRAMUSCULAR | Status: AC
  Filled 2018-07-25: qty 1

## 2018-07-25 MED ORDER — DEXMEDETOMIDINE HCL 200 MCG/2ML IV SOLN
INTRAVENOUS | Status: DC | PRN
  Administered 2018-07-25 (×2): 20 ug via INTRAVENOUS

## 2018-07-25 MED ORDER — CEFAZOLIN SODIUM-DEXTROSE 2-4 GM/100ML-% IV SOLN
INTRAVENOUS | Status: AC
  Filled 2018-07-25: qty 100

## 2018-07-25 MED ORDER — LIDOCAINE 2% (20 MG/ML) 5 ML SYRINGE
INTRAMUSCULAR | Status: DC | PRN
  Administered 2018-07-25: 60 mg via INTRAVENOUS

## 2018-07-25 MED ORDER — SODIUM CHLORIDE (PF) 0.9 % IJ SOLN
INTRAMUSCULAR | Status: AC
  Filled 2018-07-25: qty 10

## 2018-07-25 MED ORDER — VASOPRESSIN 20 UNIT/ML IV SOLN
INTRAVENOUS | Status: DC | PRN
  Administered 2018-07-25: 10 [IU] via INTRAVENOUS
  Administered 2018-07-25: 2 [IU] via INTRAVENOUS
  Administered 2018-07-25: 5 [IU] via INTRAVENOUS
  Administered 2018-07-25: 1 [IU] via INTRAVENOUS
  Administered 2018-07-25: 3 [IU] via INTRAVENOUS
  Administered 2018-07-25 (×2): 2 [IU] via INTRAVENOUS
  Administered 2018-07-25: 5 [IU] via INTRAVENOUS
  Administered 2018-07-25: 10 [IU] via INTRAVENOUS

## 2018-07-25 MED ORDER — SODIUM CHLORIDE 0.9 % IV SOLN
INTRAVENOUS | Status: AC
  Filled 2018-07-25: qty 1.2

## 2018-07-25 MED ORDER — PROPOFOL 10 MG/ML IV BOLUS
INTRAVENOUS | Status: AC
  Filled 2018-07-25: qty 20

## 2018-07-25 MED ORDER — LIDOCAINE HCL (PF) 1 % IJ SOLN
INTRAMUSCULAR | Status: AC
  Filled 2018-07-25: qty 30

## 2018-07-25 MED ORDER — DEXAMETHASONE SODIUM PHOSPHATE 10 MG/ML IJ SOLN
INTRAMUSCULAR | Status: DC | PRN
  Administered 2018-07-25: 4 mg via INTRAVENOUS

## 2018-07-25 MED ORDER — FENTANYL CITRATE (PF) 100 MCG/2ML IJ SOLN
INTRAMUSCULAR | Status: DC | PRN
  Administered 2018-07-25: 50 ug via INTRAVENOUS
  Administered 2018-07-25: 25 ug via INTRAVENOUS

## 2018-07-25 MED ORDER — ACETAMINOPHEN 10 MG/ML IV SOLN
INTRAVENOUS | Status: AC
  Filled 2018-07-25: qty 100

## 2018-07-25 MED ORDER — FENTANYL CITRATE (PF) 250 MCG/5ML IJ SOLN
INTRAMUSCULAR | Status: AC
  Filled 2018-07-25: qty 5

## 2018-07-25 MED ORDER — CEFAZOLIN SODIUM-DEXTROSE 2-4 GM/100ML-% IV SOLN
2.0000 g | INTRAVENOUS | Status: AC
  Administered 2018-07-25: 2 g via INTRAVENOUS

## 2018-07-25 MED ORDER — VASOPRESSIN 20 UNIT/ML IV SOLN
INTRAVENOUS | Status: AC
  Filled 2018-07-25: qty 1

## 2018-07-25 MED ORDER — ACETAMINOPHEN 10 MG/ML IV SOLN
INTRAVENOUS | Status: DC | PRN
  Administered 2018-07-25: 1000 mg via INTRAVENOUS

## 2018-07-25 MED ORDER — EPHEDRINE SULFATE-NACL 50-0.9 MG/10ML-% IV SOSY
PREFILLED_SYRINGE | INTRAVENOUS | Status: DC | PRN
  Administered 2018-07-25: 15 mg via INTRAVENOUS
  Administered 2018-07-25: 20 mg via INTRAVENOUS
  Administered 2018-07-25: 15 mg via INTRAVENOUS

## 2018-07-25 MED ORDER — OXYCODONE-ACETAMINOPHEN 5-325 MG PO TABS: 1.0000 | ORAL_TABLET | ORAL | 0 refills | Status: DC | PRN

## 2018-07-25 MED ORDER — DEXAMETHASONE SODIUM PHOSPHATE 10 MG/ML IJ SOLN
INTRAMUSCULAR | Status: AC
  Filled 2018-07-25: qty 1

## 2018-07-25 MED ORDER — MIDAZOLAM HCL 5 MG/5ML IJ SOLN
INTRAMUSCULAR | Status: DC | PRN
  Administered 2018-07-25: 2 mg via INTRAVENOUS

## 2018-07-25 MED ORDER — SODIUM CHLORIDE 0.9 % IV SOLN
INTRAVENOUS | Status: DC
  Administered 2018-07-25: 07:00:00 via INTRAVENOUS

## 2018-07-25 MED ORDER — LIDOCAINE HCL 1 % IJ SOLN
INTRAMUSCULAR | Status: DC | PRN
  Administered 2018-07-25: 10 mL

## 2018-07-25 MED ORDER — MIDAZOLAM HCL 2 MG/2ML IJ SOLN
INTRAMUSCULAR | Status: AC
  Filled 2018-07-25: qty 2

## 2018-07-25 MED ORDER — 0.9 % SODIUM CHLORIDE (POUR BTL) OPTIME
TOPICAL | Status: DC | PRN
  Administered 2018-07-25: 1000 mL

## 2018-07-25 MED ORDER — GLYCOPYRROLATE PF 0.2 MG/ML IJ SOSY
PREFILLED_SYRINGE | INTRAMUSCULAR | Status: DC | PRN
  Administered 2018-07-25: .2 mg via INTRAVENOUS

## 2018-07-25 MED ORDER — PROPOFOL 10 MG/ML IV BOLUS
INTRAVENOUS | Status: DC | PRN
  Administered 2018-07-25: 200 mg via INTRAVENOUS

## 2018-07-25 MED ORDER — EPINEPHRINE PF 1 MG/10ML IJ SOSY
PREFILLED_SYRINGE | INTRAMUSCULAR | Status: DC | PRN
  Administered 2018-07-25 (×3): 10 ug via INTRAVENOUS

## 2018-07-25 MED ORDER — SODIUM CHLORIDE 0.9 % IV SOLN
INTRAVENOUS | Status: DC | PRN
  Administered 2018-07-25: 500 mL

## 2018-07-25 MED ORDER — SODIUM CHLORIDE 0.9 % IV SOLN
INTRAVENOUS | Status: DC | PRN
  Administered 2018-07-25: 50 ug/min via INTRAVENOUS

## 2018-07-25 MED ORDER — PHENYLEPHRINE 40 MCG/ML (10ML) SYRINGE FOR IV PUSH (FOR BLOOD PRESSURE SUPPORT)
PREFILLED_SYRINGE | INTRAVENOUS | Status: DC | PRN
  Administered 2018-07-25: 120 ug via INTRAVENOUS
  Administered 2018-07-25: 160 ug via INTRAVENOUS
  Administered 2018-07-25 (×3): 120 ug via INTRAVENOUS
  Administered 2018-07-25: 160 ug via INTRAVENOUS

## 2018-07-25 MED ORDER — ONDANSETRON HCL 4 MG/2ML IJ SOLN
INTRAMUSCULAR | Status: DC | PRN
  Administered 2018-07-25: 4 mg via INTRAVENOUS

## 2018-07-25 MED ORDER — ONDANSETRON HCL 4 MG/2ML IJ SOLN
INTRAMUSCULAR | Status: AC
  Filled 2018-07-25: qty 2

## 2018-07-25 SURGICAL SUPPLY — 39 items
ADH SKN CLS APL DERMABOND .7 (GAUZE/BANDAGES/DRESSINGS) ×3
ARMBAND PINK RESTRICT EXTREMIT (MISCELLANEOUS) ×2 IMPLANT
BLADE CLIPPER SURG (BLADE) ×1 IMPLANT
CANISTER SUCT 3000ML PPV (MISCELLANEOUS) ×2 IMPLANT
CATH EMB 4FR 40CM (CATHETERS) ×1 IMPLANT
CLIP VESOCCLUDE MED 24/CT (CLIP) ×1 IMPLANT
CLIP VESOCCLUDE MED 6/CT (CLIP) IMPLANT
CLIP VESOCCLUDE SM WIDE 24/CT (CLIP) ×1 IMPLANT
CLIP VESOCCLUDE SM WIDE 6/CT (CLIP) IMPLANT
COVER PROBE W GEL 5X96 (DRAPES) ×2 IMPLANT
COVER WAND RF STERILE (DRAPES) ×1 IMPLANT
DERMABOND ADVANCED (GAUZE/BANDAGES/DRESSINGS) ×3
DERMABOND ADVANCED .7 DNX12 (GAUZE/BANDAGES/DRESSINGS) ×1 IMPLANT
ELECT REM PT RETURN 9FT ADLT (ELECTROSURGICAL) ×2
ELECTRODE REM PT RTRN 9FT ADLT (ELECTROSURGICAL) ×1 IMPLANT
GLOVE BIOGEL PI IND STRL 7.5 (GLOVE) ×1 IMPLANT
GLOVE BIOGEL PI INDICATOR 7.5 (GLOVE) ×1
GLOVE SURG SS PI 7.5 STRL IVOR (GLOVE) ×2 IMPLANT
GOWN STRL NON-REIN LRG LVL3 (GOWN DISPOSABLE) ×2 IMPLANT
GOWN STRL REUS W/ TWL LRG LVL3 (GOWN DISPOSABLE) ×2 IMPLANT
GOWN STRL REUS W/ TWL XL LVL3 (GOWN DISPOSABLE) ×1 IMPLANT
GOWN STRL REUS W/TWL LRG LVL3 (GOWN DISPOSABLE) ×4
GOWN STRL REUS W/TWL XL LVL3 (GOWN DISPOSABLE) ×2
HEMOSTAT SNOW SURGICEL 2X4 (HEMOSTASIS) IMPLANT
KIT BASIN OR (CUSTOM PROCEDURE TRAY) ×2 IMPLANT
KIT TURNOVER KIT B (KITS) ×2 IMPLANT
NS IRRIG 1000ML POUR BTL (IV SOLUTION) ×2 IMPLANT
PACK CV ACCESS (CUSTOM PROCEDURE TRAY) ×2 IMPLANT
PAD ARMBOARD 7.5X6 YLW CONV (MISCELLANEOUS) ×4 IMPLANT
SUT PROLENE 6 0 BV (SUTURE) ×1 IMPLANT
SUT PROLENE 6 0 CC (SUTURE) ×2 IMPLANT
SUT SILK 2 0 SH (SUTURE) ×1 IMPLANT
SUT VIC AB 3-0 SH 27 (SUTURE) ×6
SUT VIC AB 3-0 SH 27X BRD (SUTURE) ×1 IMPLANT
SUT VICRYL 4-0 PS2 18IN ABS (SUTURE) ×2 IMPLANT
SYR TB 1ML LUER SLIP (SYRINGE) ×1 IMPLANT
TOWEL GREEN STERILE (TOWEL DISPOSABLE) ×2 IMPLANT
UNDERPAD 30X30 (UNDERPADS AND DIAPERS) ×2 IMPLANT
WATER STERILE IRR 1000ML POUR (IV SOLUTION) ×2 IMPLANT

## 2018-07-25 NOTE — Interval H&P Note (Signed)
History and Physical Interval Note:  07/25/2018 7:44 AM  [name]  has presented today for surgery, with the diagnosis of STAGE FOUR CHRONIC KIDNEY DISEASE  The various methods of treatment have been discussed with the patient and family. After consideration of risks, benefits and other options for treatment, the patient has consented to  Procedure(s): LEFT ARM BASILIC VEIN TRANSPOSITION (Left) as a surgical intervention .  The patient's history has been reviewed, patient examined, no change in status, stable for surgery.  I have reviewed the patient's chart and labs.  Questions were answered to the patient's satisfaction.     [name]

## 2018-07-25 NOTE — Anesthesia Procedure Notes (Signed)
Procedure Name: LMA Insertion Date/Time: 07/25/2018 9:01 AM Performed by: Moshe Salisbury, CRNA Pre-anesthesia Checklist: Patient identified, Emergency Drugs available, Suction available and Patient being monitored Patient Re-evaluated:Patient Re-evaluated prior to induction Oxygen Delivery Method: Circle System Utilized Preoxygenation: Pre-oxygenation with 100% oxygen Induction Type: IV induction Ventilation: Mask ventilation without difficulty LMA: LMA inserted LMA Size: 5.0 Number of attempts: 1 Placement Confirmation: positive ETCO2 Tube secured with: Tape Dental Injury: Teeth and Oropharynx as per pre-operative assessment

## 2018-07-25 NOTE — Transfer of Care (Signed)
Immediate Anesthesia Transfer of Care Note  Patient: [name]  Procedure(s) Performed: LEFT ARM BASILIC VEIN TRANSPOSITION (Left Arm Upper) Thrombectomy Arteriovenous Fistula (Left Arm Upper)  Patient Location: PACU  Anesthesia Type:General  Level of Consciousness: drowsy and patient cooperative  Airway & Oxygen Therapy: Patient Spontanous Breathing and Patient connected to nasal cannula oxygen  Post-op Assessment: Report given to RN, Post -op Vital signs reviewed and stable and Patient moving all extremities  Post vital signs: Reviewed and stable  Last Vitals:  Vitals Value Taken Time  BP 117/87 07/25/2018 11:30 AM  Temp 36.6 C 07/25/2018 11:30 AM  Pulse 78 07/25/2018 11:42 AM  Resp 16 07/25/2018 11:42 AM  SpO2 100 % 07/25/2018 11:42 AM  Vitals shown include unvalidated device data.  Last Pain:  Vitals:   07/25/18 1130  TempSrc:   PainSc: Asleep      Patients Stated Pain Goal: 3 (88/30/14 1597)  Complications: No apparent anesthesia complications

## 2018-07-28 ENCOUNTER — Encounter (HOSPITAL_COMMUNITY): Payer: Self-pay | Admitting: Surgery

## 2018-07-28 NOTE — Op Note (Signed)
    Patient name: Irven Ingalsbe MRN: [id number] DOB: 05/22/1956 Sex: male  07/25/2018 Pre-operative Diagnosis: CKD Post-operative diagnosis:  Same Surgeon:  [name] Assistants:  2nd stage left basilic vein fistula Procedure:   Izetta Dakin Anesthesia:  General Blood Loss:  50cc Specimens:  none  Findings:  Sclerotic proximal 3cm of basilic vein which was transected at the anastamosis.  The sclerotic vein was discarded.  A new arteriotomy was made as was a new anastamosis.  The rest of the vein was of excellent caliber  Indications:  The patient is here for 2nd stage fistula elevation  Procedure:  The patient was identified in the holding area and taken to [place] [facility]  The patient was then placed supine on the table. general anesthesia was administered.  The patient was prepped and draped in the usual sterile fashion.  A time out was called and antibiotics were administered.  Ultrasound was used to evaluate the fistula in the upper arm.  It has matured nicely.  3 longitudinal incisions were made in the upper arm.  Through these incisions, the basilic vein was circumferentially isolated.  Multiple branches were ligated between silk ties.  The vein had matured nicely.  Once the vein had been fully localized and the nerve protected, it was marked for orientation.  I then created a subcutaneous tunnel with a curved tunneler.  Baby Gregory clamps were placed on the fistula at the anastomosis and in the axilla.  I transected the vein near the anastomosis.  There was thrombus within the proximal portion of the vein which appeared to be sclerotic in appearance.  I passed a #4 Fogarty catheter across the anastomosis and evacuated the clot and got excellent inflow.  Because the luminal caliber of the vein was suboptimal, I elected to discard this portion of the vein as the remaining vein was of excellent caliber.  I ligated the vein near the arterial anastomosis and removed approximately 3 cm of the  vein.  I then dissected out the brachial artery, proximal to the original anastomosis.  The artery was a healthy quality and about 4 mm in diameter.  The artery was occluded with vascular clamps after giving heparin.  An arteriotomy was made with a #11 blade and extended longitudinally with Potts scissors.  The vein was then spatulated To fit the size of the arteriotomy.  A running anastomosis was created with 6-0 Prolene.  Prior to completion the appropriate flushing maneuvers were performed and the anastomosis was completed.  There was an excellent thrill within the fistula.  The patient had a palpable radial pulse.  The wound was then irrigated.  Hemostasis was achieved.  The incisions were closed with a deep layer 3-0 Vicryl, and a layer of 4-0 Vicryl on the skin followed by Dermabond.  There were no immediate complications.   Disposition: To PACU stable   V. [name], M.D. [facility] [place] Office: [phone] Pager:  [phone]

## 2018-07-28 NOTE — Anesthesia Postprocedure Evaluation (Signed)
Anesthesia Post Note  Patient: [name]  Procedure(s) Performed: LEFT ARM BASILIC VEIN TRANSPOSITION (Left Arm Upper) Thrombectomy Arteriovenous Fistula (Left Arm Upper)     Patient location during evaluation: PACU Anesthesia Type: General Level of consciousness: awake and alert Pain management: pain level controlled Vital Signs Assessment: post-procedure vital signs reviewed and stable Respiratory status: spontaneous breathing, nonlabored ventilation, respiratory function stable and patient connected to nasal cannula oxygen Cardiovascular status: blood pressure returned to baseline and stable Postop Assessment: no apparent nausea or vomiting Anesthetic complications: no    Last Vitals:  Vitals:   07/25/18 1310 07/25/18 1321  BP:  114/83  Pulse:  81  Resp: 19 18  Temp:    SpO2: 98% 98%    Last Pain:  Vitals:   07/25/18 1300  TempSrc:   PainSc: Asleep                 Kimiah Hibner P Benjy Kana

## 2018-08-10 DIAGNOSIS — E8770 Fluid overload, unspecified: Secondary | ICD-10-CM | POA: Insufficient documentation

## 2018-08-18 ENCOUNTER — Encounter: Payer: 59 | Admitting: Family

## 2018-08-18 DIAGNOSIS — D689 Coagulation defect, unspecified: Secondary | ICD-10-CM | POA: Insufficient documentation

## 2018-08-18 DIAGNOSIS — Z992 Dependence on renal dialysis: Secondary | ICD-10-CM | POA: Insufficient documentation

## 2018-08-18 DIAGNOSIS — D631 Anemia in chronic kidney disease: Secondary | ICD-10-CM | POA: Insufficient documentation

## 2018-08-18 DIAGNOSIS — N189 Chronic kidney disease, unspecified: Secondary | ICD-10-CM | POA: Insufficient documentation

## 2018-08-18 DIAGNOSIS — D509 Iron deficiency anemia, unspecified: Secondary | ICD-10-CM | POA: Insufficient documentation

## 2018-08-18 DIAGNOSIS — I25119 Atherosclerotic heart disease of native coronary artery with unspecified angina pectoris: Secondary | ICD-10-CM | POA: Insufficient documentation

## 2018-08-18 DIAGNOSIS — R0602 Shortness of breath: Secondary | ICD-10-CM | POA: Insufficient documentation

## 2018-08-18 DIAGNOSIS — N2581 Secondary hyperparathyroidism of renal origin: Secondary | ICD-10-CM | POA: Insufficient documentation

## 2018-08-26 DIAGNOSIS — E43 Unspecified severe protein-calorie malnutrition: Secondary | ICD-10-CM | POA: Insufficient documentation

## 2018-09-05 ENCOUNTER — Encounter: Payer: 59 | Admitting: Family

## 2018-09-08 ENCOUNTER — Telehealth: Payer: 59 | Admitting: Surgery

## 2018-10-09 DIAGNOSIS — Z23 Encounter for immunization: Secondary | ICD-10-CM | POA: Insufficient documentation

## 2019-03-17 ENCOUNTER — Telehealth: Payer: Self-pay | Admitting: *Deleted

## 2019-03-17 NOTE — Telephone Encounter (Signed)
Called [place] at [facility] and gave  Her the following and asked her to share with Alex Brigham NP. I called [name] to scheduled fistulogram and he stated he is not having a problem now. He states bleeding with needle removal "depends on the person who stuck the access". I encouraged him to share all of his questions and concerns with the staff and let them know that he is not agreeable to book procedure at this time.

## 2019-04-30 ENCOUNTER — Encounter: Payer: Self-pay | Admitting: *Deleted

## 2019-04-30 ENCOUNTER — Other Ambulatory Visit: Payer: Self-pay | Admitting: *Deleted

## 2019-04-30 ENCOUNTER — Telehealth: Payer: Self-pay | Admitting: *Deleted

## 2019-04-30 NOTE — Progress Notes (Signed)
Fistulogram requested by NP(Sue High at [facility] due to prolonged bleeding and pain after needle removed at completion of treatment. Scheduled.

## 2019-04-30 NOTE — Telephone Encounter (Signed)
Call from Ms. Locklear at [facility] that patient still needs fistulogram and tells NP(Sue High) there he is agreeable to schedule.

## 2019-05-01 ENCOUNTER — Other Ambulatory Visit (HOSPITAL_COMMUNITY)
Admission: RE | Admit: 2019-05-01 | Discharge: 2019-05-01 | Disposition: A | Discharge status: Home / Self Care | Payer: 59 | Source: Ambulatory Visit | Attending: Surgery | Admitting: Surgery

## 2019-05-01 DIAGNOSIS — Z01812 Encounter for preprocedural laboratory examination: Secondary | ICD-10-CM | POA: Diagnosis present

## 2019-05-01 DIAGNOSIS — Z20828 Contact with and (suspected) exposure to other viral communicable diseases: Secondary | ICD-10-CM | POA: Insufficient documentation

## 2019-05-03 LAB — NOVEL CORONAVIRUS, NAA (HOSP ORDER, SEND-OUT TO REF LAB; TAT 18-24 HRS): SARS-CoV-2, NAA: NOT DETECTED

## 2019-05-05 ENCOUNTER — Encounter (HOSPITAL_COMMUNITY)
Admission: RE | Disposition: A | Discharge status: Home / Self Care | Payer: Self-pay | Source: Home / Self Care | Attending: Surgery

## 2019-05-05 ENCOUNTER — Encounter (HOSPITAL_COMMUNITY): Payer: Self-pay | Admitting: Surgery

## 2019-05-05 ENCOUNTER — Ambulatory Visit (HOSPITAL_COMMUNITY)
Admission: RE | Admit: 2019-05-05 | Discharge: 2019-05-05 | Disposition: A | Discharge status: Home / Self Care | Payer: 59 | Attending: Surgery | Admitting: Surgery

## 2019-05-05 ENCOUNTER — Other Ambulatory Visit: Payer: Self-pay

## 2019-05-05 DIAGNOSIS — N186 End stage renal disease: Secondary | ICD-10-CM | POA: Diagnosis not present

## 2019-05-05 DIAGNOSIS — Y832 Surgical operation with anastomosis, bypass or graft as the cause of abnormal reaction of the patient, or of later complication, without mention of misadventure at the time of the procedure: Secondary | ICD-10-CM | POA: Diagnosis not present

## 2019-05-05 DIAGNOSIS — Z992 Dependence on renal dialysis: Secondary | ICD-10-CM

## 2019-05-05 DIAGNOSIS — T82898A Other specified complication of vascular prosthetic devices, implants and grafts, initial encounter: Secondary | ICD-10-CM

## 2019-05-05 DIAGNOSIS — T82858A Stenosis of vascular prosthetic devices, implants and grafts, initial encounter: Secondary | ICD-10-CM | POA: Diagnosis not present

## 2019-05-05 HISTORY — PX: PERIPHERAL VASCULAR BALLOON ANGIOPLASTY: CATH118281

## 2019-05-05 HISTORY — PX: A/V FISTULAGRAM: CATH118298

## 2019-05-05 LAB — POCT I-STAT, CHEM 8
BUN: 45 mg/dL — ABNORMAL HIGH (ref 8–23)
Calcium, Ion: 1.24 mmol/L (ref 1.15–1.40)
Chloride: 97 mmol/L — ABNORMAL LOW (ref 98–111)
Creatinine, Ser: 12.4 mg/dL — ABNORMAL HIGH (ref 0.61–1.24)
Glucose, Bld: 78 mg/dL (ref 70–99)
HCT: 39 % (ref 39.0–52.0)
Hemoglobin: 13.3 g/dL (ref 13.0–17.0)
Potassium: 4.6 mmol/L (ref 3.5–5.1)
Sodium: 138 mmol/L (ref 135–145)
TCO2: 28 mmol/L (ref 22–32)

## 2019-05-05 SURGERY — A/V FISTULAGRAM
Anesthesia: LOCAL | Laterality: Left

## 2019-05-05 MED ORDER — SODIUM CHLORIDE 0.9% FLUSH: 3.0000 mL | INTRAVENOUS | Status: DC | PRN

## 2019-05-05 MED ORDER — HEPARIN (PORCINE) IN NACL 1000-0.9 UT/500ML-% IV SOLN
INTRAVENOUS | Status: DC | PRN
  Administered 2019-05-05: 500 mL

## 2019-05-05 MED ORDER — FENTANYL CITRATE (PF) 100 MCG/2ML IJ SOLN
INTRAMUSCULAR | Status: DC | PRN
  Administered 2019-05-05: 25 ug via INTRAVENOUS

## 2019-05-05 MED ORDER — IODIXANOL 320 MG/ML IV SOLN
INTRAVENOUS | Status: DC | PRN
  Administered 2019-05-05: 30 mL

## 2019-05-05 MED ORDER — MIDAZOLAM HCL 2 MG/2ML IJ SOLN
INTRAMUSCULAR | Status: DC | PRN
  Administered 2019-05-05: 1 mg via INTRAVENOUS

## 2019-05-05 MED ORDER — LIDOCAINE HCL (PF) 1 % IJ SOLN
INTRAMUSCULAR | Status: DC | PRN
  Administered 2019-05-05: 2 mL via INTRADERMAL
  Administered 2019-05-05: 3 mL

## 2019-05-05 MED ORDER — MIDAZOLAM HCL 2 MG/2ML IJ SOLN
INTRAMUSCULAR | Status: AC
  Filled 2019-05-05: qty 2

## 2019-05-05 MED ORDER — HEPARIN (PORCINE) IN NACL 1000-0.9 UT/500ML-% IV SOLN
INTRAVENOUS | Status: AC
  Filled 2019-05-05: qty 500

## 2019-05-05 MED ORDER — FENTANYL CITRATE (PF) 100 MCG/2ML IJ SOLN
INTRAMUSCULAR | Status: AC
  Filled 2019-05-05: qty 2

## 2019-05-05 MED ORDER — LIDOCAINE HCL (PF) 1 % IJ SOLN
INTRAMUSCULAR | Status: AC
  Filled 2019-05-05: qty 30

## 2019-05-05 SURGICAL SUPPLY — 16 items
BAG SNAP BAND KOVER 36X36 (MISCELLANEOUS) ×3 IMPLANT
BALLN MUSTANG 10.0X40 75 (BALLOONS) ×3
BALLN MUSTANG 7.0X40 75 (BALLOONS) ×3
BALLOON MUSTANG 10.0X40 75 (BALLOONS) IMPLANT
BALLOON MUSTANG 7.0X40 75 (BALLOONS) IMPLANT
COVER DOME SNAP 22 D (MISCELLANEOUS) ×3 IMPLANT
KIT ENCORE 26 ADVANTAGE (KITS) ×1 IMPLANT
KIT MICROPUNCTURE NIT STIFF (SHEATH) ×1 IMPLANT
PROTECTION STATION PRESSURIZED (MISCELLANEOUS) ×3
SHEATH PINNACLE R/O II 6F 4CM (SHEATH) ×1 IMPLANT
SHEATH PROBE COVER 6X72 (BAG) ×3 IMPLANT
STATION PROTECTION PRESSURIZED (MISCELLANEOUS) ×2 IMPLANT
STOPCOCK MORSE 400PSI 3WAY (MISCELLANEOUS) ×3 IMPLANT
TRAY PV CATH (CUSTOM PROCEDURE TRAY) ×3 IMPLANT
TUBING CIL FLEX 10 FLL-RA (TUBING) ×3 IMPLANT
WIRE BENTSON .035X145CM (WIRE) ×1 IMPLANT

## 2019-05-05 NOTE — Op Note (Signed)
    Patient name: [name] MRN: [id number] DOB: 1955/08/18 Sex: male  05/05/2019 Pre-operative Diagnosis: ESRD Post-operative diagnosis:  Same Surgeon:  [name] Procedure Performed:  1.  Ultrasound-guided access, left basilic vein  2.  Fistulogram  3.  Venoplasty, peripheral vein  4.  Conscious sedation, 25 minutes    Indications: The patient is having trouble with cannulation.  He is here today for further evaluation.  Procedure:  The patient was identified in the holding area and taken to room 8.  The patient was then placed supine on the table and prepped and draped in the usual sterile fashion.  A time out was called.  Conscious sedation was administered with the use of IV fentanyl and Versed under continuous physician and nurse monitoring.  Heart rate, blood pressure, and oxygen saturations were continuously monitored.  Total sedation time was 25 minutes ultrasound was used to evaluate the fistula.  The vein was patent and compressible.  A digital ultrasound image was acquired.  The fistula was then accessed under ultrasound guidance using a micropuncture needle.  An 018 wire was then asvanced without resistance and a micropuncture sheath was placed.  Contrast injections were then performed through the sheath.  Findings: No central venous stenosis.  The arterial venous anastomosis is widely patent.  The fistula is patent however in the midportion there is a 90% stenosis over a distance of about 2 cm.   Intervention: After the above images were acquired the decision was made to proceed with intervention.  Over a Bentson wire, a 6 French sheath was placed.  I first selected a 7 x 40 balloon and perform peripheral venoplasty across the stenosis.  This was taken to 20 atm for 30 seconds.  Follow-up imaging revealed improved but still residual stenosis and so I upsized to a 10 x 40 Mustang balloon.  I took this to 16 atm.  There was a very focal stenosis that I was reluctant to be more  aggressive with due to concerns of a rupture.  After venoplasty, the stenosis was now approximately 50 to 60%.  I did not want to upsize the balloon for fear of rupture of the vein.  Therefore sheath and wire were removed and a suture was used for closure  Impression:  #1  Greater than 90% stenosis within the basilic vein.  This was dilated with a 7 and a 10 mm balloon.  There was a residual napkin ring like stenosis.  I elected not to be more aggressive with balloon venoplasty due to concerns of a rupture of the vein.  If he continues to have issues with dialysis, he would need surgical repair of this portion of the vein.    Theotis Burrow, M.D., [facility] [place] Office: [phone] Pager:  [phone]

## 2019-05-05 NOTE — H&P (View-Only) (Signed)
   Patient name: [name] MRN: [id number] DOB: 29-Jul-1955 Sex: male   HISTORY OF PRESENT ILLNESS:   Dwaun Sosebee is a 63 y.o. male with end-stage renal disease who is having difficulty with cannulation, pain and bleeding.  He is here for fistulogram  CURRENT MEDICATIONS:    Current Facility-Administered Medications  Medication Dose Route Frequency Provider Last Rate Last Dose  . sodium chloride flush (NS) 0.9 % injection 3 mL  3 mL Intravenous PRN Serafina Mitchell, MD        REVIEW OF SYSTEMS:   [X]  denotes positive finding, [ ]  denotes negative finding Cardiac  Comments:  Chest pain or chest pressure:    Shortness of breath upon exertion:    Short of breath when lying flat:    Irregular heart rhythm:    Constitutional    Fever or chills:      PHYSICAL EXAM:   Vitals:   05/05/19 0713  BP: (!) 134/96  Pulse: 60  Resp: 18  Temp: 98.2 F (36.8 C)  TempSrc: Skin  SpO2: 100%  Weight: 81.6 kg  Height: 6\' 1"  (1.854 m)    GENERAL: The patient is a well-nourished male, in no acute distress. The vital signs are documented above. CARDIOVASCULAR: There is a regular rate and rhythm. PULMONARY: Non-labored respirations   STUDIES:   None   MEDICAL ISSUES:   Poorly functioning fistula, will proceed with fistulogram and intervention as indicated.  Leia Alf, MD, FACS [facility] [phone] Pager [phone]

## 2019-05-05 NOTE — Consult Note (Signed)
   Patient name: [name] MRN: [id number] DOB: Nov 24, 1955 Sex: male   HISTORY OF PRESENT ILLNESS:   [name] is a 63 y.o. male with end-stage renal disease who is having difficulty with cannulation, pain and bleeding.  He is here for fistulogram  CURRENT MEDICATIONS:    Current Facility-Administered Medications  Medication Dose Route Frequency Provider Last Rate Last Dose  . sodium chloride flush (NS) 0.9 % injection 3 mL  3 mL Intravenous PRN Serafina Mitchell, MD        REVIEW OF SYSTEMS:   [X]  denotes positive finding, [ ]  denotes negative finding Cardiac  Comments:  Chest pain or chest pressure:    Shortness of breath upon exertion:    Short of breath when lying flat:    Irregular heart rhythm:    Constitutional    Fever or chills:      PHYSICAL EXAM:   Vitals:   05/05/19 0713  BP: (!) 134/96  Pulse: 60  Resp: 18  Temp: 98.2 F (36.8 C)  TempSrc: Skin  SpO2: 100%  Weight: 81.6 kg  Height: 6\' 1"  (1.854 m)    GENERAL: The patient is a well-nourished male, in no acute distress. The vital signs are documented above. CARDIOVASCULAR: There is a regular rate and rhythm. PULMONARY: Non-labored respirations   STUDIES:   None   MEDICAL ISSUES:   Poorly functioning fistula, will proceed with fistulogram and intervention as indicated.  Leia Alf, MD, FACS [facility] [phone] Pager [phone]

## 2019-05-05 NOTE — Discharge Instructions (Signed)

## 2019-05-08 NOTE — Interval H&P Note (Signed)
History and Physical Interval Note:  05/08/2019 2:17 PM  [name]  has presented today for surgery, with the diagnosis of complication of fistula.  The various methods of treatment have been discussed with the patient and family. After consideration of risks, benefits and other options for treatment, the patient has consented to  Procedure(s) with comments: A/V FISTULAGRAM (Left) PERIPHERAL VASCULAR BALLOON ANGIOPLASTY - Lt upper arm fistula as a surgical intervention.  The patient's history has been reviewed, patient examined, no change in status, stable for surgery.  I have reviewed the patient's chart and labs.  Questions were answered to the patient's satisfaction.     [name]

## 2019-05-11 ENCOUNTER — Encounter (HOSPITAL_COMMUNITY): Payer: Self-pay | Admitting: Surgery

## 2019-06-02 ENCOUNTER — Other Ambulatory Visit: Payer: Self-pay

## 2019-06-02 DIAGNOSIS — N184 Chronic kidney disease, stage 4 (severe): Secondary | ICD-10-CM

## 2019-06-03 ENCOUNTER — Telehealth (HOSPITAL_COMMUNITY): Payer: Self-pay | Admitting: *Deleted

## 2019-06-03 NOTE — Telephone Encounter (Signed)

## 2019-06-04 ENCOUNTER — Ambulatory Visit (HOSPITAL_COMMUNITY)
Admission: RE | Admit: 2019-06-04 | Discharge: 2019-06-04 | Disposition: A | Discharge status: Home / Self Care | Payer: 59 | Source: Ambulatory Visit | Attending: Family | Admitting: Family

## 2019-06-04 ENCOUNTER — Ambulatory Visit (INDEPENDENT_AMBULATORY_CARE_PROVIDER_SITE_OTHER): Payer: 59 | Admitting: Family

## 2019-06-04 ENCOUNTER — Encounter: Payer: Self-pay | Admitting: Family

## 2019-06-04 ENCOUNTER — Other Ambulatory Visit: Payer: Self-pay

## 2019-06-04 VITALS — BP 121/76 | HR 67 | Temp 97.6°F | Resp 20 | Ht 73.0 in | Wt 194.9 lb

## 2019-06-04 DIAGNOSIS — N184 Chronic kidney disease, stage 4 (severe): Secondary | ICD-10-CM | POA: Insufficient documentation

## 2019-06-04 DIAGNOSIS — N186 End stage renal disease: Secondary | ICD-10-CM

## 2019-06-04 DIAGNOSIS — Z992 Dependence on renal dialysis: Secondary | ICD-10-CM

## 2019-06-04 DIAGNOSIS — T82590D Other mechanical complication of surgically created arteriovenous fistula, subsequent encounter: Secondary | ICD-10-CM

## 2019-06-04 NOTE — H&P (View-Only) (Signed)
CC: low flow rate in HD via AVF  History of Present Illness  [name] is a 63 y.o. ([birth year]) male who is s/p fistulogram with venoplasty of peripheral vein on 05-05-19 by Dr. Trula Slade for trouble with cannulation.  He is s/p 2nd stage left basilic vein fistula creation on 07-25-18 by Dr. Trula Slade.  He returns today at the request of his [place], pt states there are flow rate issues during HD for a few weeks.  He denies any steal type symptoms in his left hand.   He dialyzes via left upper arm AVF on MWF at [facility] in [place], on [address].   He is right hand dominant.   This is his first permanent access.   He never used tobacco  He does not have DM   Past Medical History:  Diagnosis Date  . ANEMIA   . Anxiety   . CHRONIC KIDNEY DISEASE STAGE III (MODERATE)   . Chronic systolic heart failure (HCC)    NICM - LVEF 30% echo 12/2008; improved to 50% on echo 03/2012  . DYSLIPIDEMIA   . ERECTILE DYSFUNCTION, ORGANIC   . Gout    1st attack 04/2015, diet changes to treat same  . Hyperparathyroidism, secondary ([place])   . HYPERTENSION   . OSA (obstructive sleep apnea)    mild, CPAP not recommended 08/2012    Social History Social History   Tobacco Use  . Smoking status: Never Smoker  . Smokeless tobacco: Never Used  Substance Use Topics  . Alcohol use: No    Alcohol/week: 0.0 standard drinks  . Drug use: No    Family History Family History  Problem Relation Age of Onset  . Hypertension Mother   . Hypertension Father   . COPD Father   . Coronary artery disease Father   . Coronary artery disease Brother 22       sudden death with MI    Surgical History Past Surgical History:  Procedure Laterality Date  . A/V FISTULAGRAM Left 05/05/2019   Procedure: A/V FISTULAGRAM;  Surgeon: Serafina Mitchell, MD;  Location: [place] [facility];  Service: Cardiovascular;  Laterality: Left;  . BASCILIC VEIN TRANSPOSITION Left 06/05/2018   Procedure: FIRST STAGE BASILIC  VEIN TRANSPOSITION LEFT ARM;  Surgeon: Serafina Mitchell, MD;  Location: [facility];  Service: Vascular;  Laterality: Left;  . BASCILIC VEIN TRANSPOSITION Left 07/25/2018   Procedure: LEFT ARM BASILIC VEIN TRANSPOSITION;  Surgeon: Serafina Mitchell, MD;  Location: [facility];  Service: Vascular;  Laterality: Left;  . COLONOSCOPY    . PERIPHERAL VASCULAR BALLOON ANGIOPLASTY  05/05/2019   Procedure: PERIPHERAL VASCULAR BALLOON ANGIOPLASTY;  Surgeon: Serafina Mitchell, MD;  Location: [facility];  Service: Cardiovascular;;  Lt upper arm fistula  . THROMBECTOMY W/ EMBOLECTOMY Left 07/25/2018   Procedure: Thrombectomy Arteriovenous Fistula;  Surgeon: Serafina Mitchell, MD;  Location: [facility] OR;  Service: Vascular;  Laterality: Left;  Marland Kitchen VASECTOMY      Allergies  Allergen Reactions  . Amlodipine Swelling  . Metolazone Other (See Comments)    Made kidney function worse     Current Outpatient Medications  Medication Sig Dispense Refill  . allopurinol (ZYLOPRIM) 100 MG tablet Take 100 mg by mouth daily.    . bisoprolol (ZEBETA) 10 MG tablet Take 10 mg by mouth daily as needed. High blood pressure    . colchicine-probenecid 0.5-500 MG tablet Take 1 tablet by mouth daily.    Marland Kitchen ezetimibe (ZETIA) 10 MG tablet  Take 10 mg by mouth daily.    . ferric citrate (AURYXIA) 1 GM 210 MG(Fe) tablet Take 840 mg by mouth 3 (three) times daily with meals.    . hydrALAZINE (APRESOLINE) 10 MG tablet Take 10 mg by mouth daily as needed (elevated blood pressure).    Marland Kitchen lidocaine-prilocaine (EMLA) cream Apply 1 application topically See admin instructions. Use before poet access     No current facility-administered medications for this visit.     REVIEW OF SYSTEMS: see HPI for pertinent positives and negatives    PHYSICAL EXAMINATION:  Vitals:   06/04/19 1542  BP: 121/76  Pulse: 67  Resp: 20  Temp: 97.6 F (36.4 C)  SpO2: 98%  Weight: 194 lb 14.4 oz (88.4 kg)  Height: 6\' 1"  (1.854 m)   Body mass index is 25.71  kg/m.  General: The patient appears younger than his stated age.   HEENT:  No gross abnormalities Pulmonary: Respirations are non-labored, good air movement in all fields, no rales, rhonchi, or wheezes Abdomen: Soft and non-tender with normal bowel sounds. Musculoskeletal: There are no major deformities.   Neurologic: No focal weakness or paresthesias are detected, bilateral hand grip strength is 5/5 Skin: There are no ulcer or rashes noted. Psychiatric: The patient has normal affect. Cardiovascular: There is a regular rate and rhythm without significant murmur appreciated. Left upper arm AVF with audible bruit and palpable thrill in parts of the AVF, no thrill or bruit in 2 sections of the AVF. Left radial pulse is 1+ palpable.    Non-Invasive Vascular Imaging  Left Upper arm Access Duplex  (Date: 06/04/2019):  Findings: +--------------------+----------+-----------------+--------+ AVF                 PSV (cm/s)Flow Vol (mL/min)Comments +--------------------+----------+-----------------+--------+ Native artery inflow    46           125                +--------------------+----------+-----------------+--------+ AVF Anastomosis         90                              +--------------------+----------+-----------------+--------+  +------------+----------+-------------+-----------+----------------------------+ OUTFLOW VEINPSV (cm/s)Diameter (cm)Depth (cm)           Describe           +------------+----------+-------------+-----------+----------------------------+ Prox UA     117 / 192  1.21 / 0.33 0.35 / 0.30     change in Diameter      +------------+----------+-------------+-----------+----------------------------+ Mid UA          76     0.35 / 0.61 0.29 / 0.21    thrombus visualized      +------------+----------+-------------+-----------+----------------------------+ Dist UA        1203       0.39        0.16    stenosis approximately 1.87                                                         cm in length         +------------+----------+-------------+-----------+----------------------------+ Summary: Visualized plaque in the mid to distal upper arm in the outflow vein with signifcant stenosis and with a audible bruie.    Medical Decision Making  [name] is  a 63 y.o. male who is s/p fistulogram with venoplasty of peripheral vein on 05-05-19 by Dr. Trula Slade for trouble with cannulation.  He is s/p 2nd stage left basilic vein fistula creation on 07-25-18 by Dr. Trula Slade.  He returns today at the request of his [place], pt states there are flow rate issues during HD for a few weeks.   Left upper arm AVF duplex today shows plaque in the mid to distal upper arm in the outflow vein with signifcant stenosis and with a audible bruit.  I spoke with Dr. Donzetta Matters by phone about the above. Will schedule a fistula gram next week on a non dialysis day.   Clemon Chambers, RN, MSN, FNP-C [facility] [place] Office: [phone]  06/04/2019, 3:52 PM  Clinic MD: Donzetta Matters on call

## 2019-06-04 NOTE — Progress Notes (Signed)
CC: low flow rate in HD via AVF  History of Present Illness  [name] is a 63 y.o. (08-Jun-1956) male who is s/p fistulogram with venoplasty of peripheral vein on 05-05-19 by Dr. Trula Slade for trouble with cannulation.  He is s/p 2nd stage left basilic vein fistula creation on 07-25-18 by Dr. Trula Slade.  He returns today at the request of his [place], pt states there are flow rate issues during HD for a few weeks.  He denies any steal type symptoms in his left hand.   He dialyzes via left upper arm AVF on MWF at [facility] in [place], on [address].   He is right hand dominant.   This is his first permanent access.   He never used tobacco  He does not have DM   Past Medical History:  Diagnosis Date  . ANEMIA   . Anxiety   . CHRONIC KIDNEY DISEASE STAGE III (MODERATE)   . Chronic systolic heart failure (HCC)    NICM - LVEF 30% echo 12/2008; improved to 50% on echo 03/2012  . DYSLIPIDEMIA   . ERECTILE DYSFUNCTION, ORGANIC   . Gout    1st attack 04/2015, diet changes to treat same  . Hyperparathyroidism, secondary ([place])   . HYPERTENSION   . OSA (obstructive sleep apnea)    mild, CPAP not recommended 08/2012    Social History Social History   Tobacco Use  . Smoking status: Never Smoker  . Smokeless tobacco: Never Used  Substance Use Topics  . Alcohol use: No    Alcohol/week: 0.0 standard drinks  . Drug use: No    Family History Family History  Problem Relation Age of Onset  . Hypertension Mother   . Hypertension Father   . COPD Father   . Coronary artery disease Father   . Coronary artery disease Brother 2       sudden death with MI    Surgical History Past Surgical History:  Procedure Laterality Date  . A/V FISTULAGRAM Left 05/05/2019   Procedure: A/V FISTULAGRAM;  Surgeon: Serafina Mitchell, MD;  Location: [facility];  Service: Cardiovascular;  Laterality: Left;  . BASCILIC VEIN TRANSPOSITION Left 06/05/2018   Procedure: FIRST STAGE BASILIC  VEIN TRANSPOSITION LEFT ARM;  Surgeon: Serafina Mitchell, MD;  Location: [place];  Service: Vascular;  Laterality: Left;  . BASCILIC VEIN TRANSPOSITION Left 07/25/2018   Procedure: LEFT ARM BASILIC VEIN TRANSPOSITION;  Surgeon: Serafina Mitchell, MD;  Location: [facility];  Service: Vascular;  Laterality: Left;  . COLONOSCOPY    . PERIPHERAL VASCULAR BALLOON ANGIOPLASTY  05/05/2019   Procedure: PERIPHERAL VASCULAR BALLOON ANGIOPLASTY;  Surgeon: Serafina Mitchell, MD;  Location: [facility];  Service: Cardiovascular;;  Lt upper arm fistula  . THROMBECTOMY W/ EMBOLECTOMY Left 07/25/2018   Procedure: Thrombectomy Arteriovenous Fistula;  Surgeon: Serafina Mitchell, MD;  Location: [facility] OR;  Service: Vascular;  Laterality: Left;  Marland Kitchen VASECTOMY      Allergies  Allergen Reactions  . Amlodipine Swelling  . Metolazone Other (See Comments)    Made kidney function worse     Current Outpatient Medications  Medication Sig Dispense Refill  . allopurinol (ZYLOPRIM) 100 MG tablet Take 100 mg by mouth daily.    . bisoprolol (ZEBETA) 10 MG tablet Take 10 mg by mouth daily as needed. High blood pressure    . colchicine-probenecid 0.5-500 MG tablet Take 1 tablet by mouth daily.    Marland Kitchen ezetimibe (ZETIA) 10 MG tablet  Take 10 mg by mouth daily.    . ferric citrate (AURYXIA) 1 GM 210 MG(Fe) tablet Take 840 mg by mouth 3 (three) times daily with meals.    . hydrALAZINE (APRESOLINE) 10 MG tablet Take 10 mg by mouth daily as needed (elevated blood pressure).    Marland Kitchen lidocaine-prilocaine (EMLA) cream Apply 1 application topically See admin instructions. Use before poet access     No current facility-administered medications for this visit.     REVIEW OF SYSTEMS: see HPI for pertinent positives and negatives    PHYSICAL EXAMINATION:  Vitals:   06/04/19 1542  BP: 121/76  Pulse: 67  Resp: 20  Temp: 97.6 F (36.4 C)  SpO2: 98%  Weight: 194 lb 14.4 oz (88.4 kg)  Height: 6\' 1"  (1.854 m)   Body mass index is 25.71  kg/m.  General: The patient appears younger than his stated age.   HEENT:  No gross abnormalities Pulmonary: Respirations are non-labored, good air movement in all fields, no rales, rhonchi, or wheezes Abdomen: Soft and non-tender with normal bowel sounds. Musculoskeletal: There are no major deformities.   Neurologic: No focal weakness or paresthesias are detected, bilateral hand grip strength is 5/5 Skin: There are no ulcer or rashes noted. Psychiatric: The patient has normal affect. Cardiovascular: There is a regular rate and rhythm without significant murmur appreciated. Left upper arm AVF with audible bruit and palpable thrill in parts of the AVF, no thrill or bruit in 2 sections of the AVF. Left radial pulse is 1+ palpable.    Non-Invasive Vascular Imaging  Left Upper arm Access Duplex  (Date: 06/04/2019):  Findings: +--------------------+----------+-----------------+--------+ AVF                 PSV (cm/s)Flow Vol (mL/min)Comments +--------------------+----------+-----------------+--------+ Native artery inflow    46           125                +--------------------+----------+-----------------+--------+ AVF Anastomosis         90                              +--------------------+----------+-----------------+--------+  +------------+----------+-------------+-----------+----------------------------+ OUTFLOW VEINPSV (cm/s)Diameter (cm)Depth (cm)           Describe           +------------+----------+-------------+-----------+----------------------------+ Prox UA     117 / 192  1.21 / 0.33 0.35 / 0.30     change in Diameter      +------------+----------+-------------+-----------+----------------------------+ Mid UA          76     0.35 / 0.61 0.29 / 0.21    thrombus visualized      +------------+----------+-------------+-----------+----------------------------+ Dist UA        1203       0.39        0.16    stenosis approximately 1.87                                                         cm in length         +------------+----------+-------------+-----------+----------------------------+ Summary: Visualized plaque in the mid to distal upper arm in the outflow vein with signifcant stenosis and with a audible bruie.    Medical Decision Making  [name] is  a 63 y.o. male who is s/p fistulogram with venoplasty of peripheral vein on 05-05-19 by Dr. Trula Slade for trouble with cannulation.  He is s/p 2nd stage left basilic vein fistula creation on 07-25-18 by Dr. Trula Slade.  He returns today at the request of his [place], pt states there are flow rate issues during HD for a few weeks.   Left upper arm AVF duplex today shows plaque in the mid to distal upper arm in the outflow vein with signifcant stenosis and with a audible bruit.  I spoke with Dr. Donzetta Matters by phone about the above. Will schedule a fistula gram next week on a non dialysis day.   Clemon Chambers, RN, MSN, FNP-C [facility] [place] Office: [phone]  06/04/2019, 3:52 PM  Clinic MD: Donzetta Matters on call

## 2019-06-05 ENCOUNTER — Encounter: Payer: Self-pay | Admitting: *Deleted

## 2019-06-05 ENCOUNTER — Other Ambulatory Visit: Payer: Self-pay | Admitting: *Deleted

## 2019-06-05 NOTE — Progress Notes (Signed)
Reviewed all pre-procedure and nasal swab instructions with patient via phone and all instructions faxed to [place] received by Anderson Malta .

## 2019-06-20 ENCOUNTER — Other Ambulatory Visit (HOSPITAL_COMMUNITY)
Admission: RE | Admit: 2019-06-20 | Discharge: 2019-06-20 | Disposition: A | Discharge status: Home / Self Care | Payer: 59 | Source: Ambulatory Visit | Attending: Surgery | Admitting: Surgery

## 2019-06-20 DIAGNOSIS — Z01812 Encounter for preprocedural laboratory examination: Secondary | ICD-10-CM | POA: Insufficient documentation

## 2019-06-20 DIAGNOSIS — Z20822 Contact with and (suspected) exposure to covid-19: Secondary | ICD-10-CM | POA: Insufficient documentation

## 2019-06-20 LAB — SARS CORONAVIRUS 2 (TAT 6-24 HRS): SARS Coronavirus 2: NEGATIVE

## 2019-06-23 ENCOUNTER — Ambulatory Visit (HOSPITAL_COMMUNITY)
Admission: RE | Admit: 2019-06-23 | Discharge: 2019-06-23 | Disposition: A | Discharge status: Home / Self Care | Payer: 59 | Attending: Surgery | Admitting: Surgery

## 2019-06-23 ENCOUNTER — Encounter (HOSPITAL_COMMUNITY): Payer: Self-pay | Admitting: Surgery

## 2019-06-23 ENCOUNTER — Ambulatory Visit (HOSPITAL_COMMUNITY): Payer: 59 | Admitting: Certified Registered Nurse Anesthetist

## 2019-06-23 ENCOUNTER — Other Ambulatory Visit: Payer: Self-pay

## 2019-06-23 ENCOUNTER — Encounter (HOSPITAL_COMMUNITY)
Admission: RE | Disposition: A | Discharge status: Home / Self Care | Payer: Self-pay | Source: Home / Self Care | Attending: Surgery

## 2019-06-23 DIAGNOSIS — T82858A Stenosis of vascular prosthetic devices, implants and grafts, initial encounter: Secondary | ICD-10-CM | POA: Insufficient documentation

## 2019-06-23 DIAGNOSIS — N186 End stage renal disease: Secondary | ICD-10-CM | POA: Diagnosis not present

## 2019-06-23 DIAGNOSIS — E785 Hyperlipidemia, unspecified: Secondary | ICD-10-CM | POA: Diagnosis not present

## 2019-06-23 DIAGNOSIS — I5022 Chronic systolic (congestive) heart failure: Secondary | ICD-10-CM | POA: Insufficient documentation

## 2019-06-23 DIAGNOSIS — I132 Hypertensive heart and chronic kidney disease with heart failure and with stage 5 chronic kidney disease, or end stage renal disease: Secondary | ICD-10-CM | POA: Diagnosis not present

## 2019-06-23 DIAGNOSIS — F419 Anxiety disorder, unspecified: Secondary | ICD-10-CM | POA: Diagnosis not present

## 2019-06-23 DIAGNOSIS — M109 Gout, unspecified: Secondary | ICD-10-CM | POA: Diagnosis not present

## 2019-06-23 DIAGNOSIS — T82868A Thrombosis of vascular prosthetic devices, implants and grafts, initial encounter: Secondary | ICD-10-CM

## 2019-06-23 DIAGNOSIS — Z888 Allergy status to other drugs, medicaments and biological substances status: Secondary | ICD-10-CM | POA: Insufficient documentation

## 2019-06-23 DIAGNOSIS — T82510A Breakdown (mechanical) of surgically created arteriovenous fistula, initial encounter: Secondary | ICD-10-CM | POA: Diagnosis present

## 2019-06-23 DIAGNOSIS — Y841 Kidney dialysis as the cause of abnormal reaction of the patient, or of later complication, without mention of misadventure at the time of the procedure: Secondary | ICD-10-CM | POA: Diagnosis not present

## 2019-06-23 DIAGNOSIS — Z79899 Other long term (current) drug therapy: Secondary | ICD-10-CM | POA: Diagnosis not present

## 2019-06-23 DIAGNOSIS — Z992 Dependence on renal dialysis: Secondary | ICD-10-CM | POA: Insufficient documentation

## 2019-06-23 DIAGNOSIS — N2581 Secondary hyperparathyroidism of renal origin: Secondary | ICD-10-CM | POA: Diagnosis not present

## 2019-06-23 DIAGNOSIS — G4733 Obstructive sleep apnea (adult) (pediatric): Secondary | ICD-10-CM | POA: Insufficient documentation

## 2019-06-23 DIAGNOSIS — I428 Other cardiomyopathies: Secondary | ICD-10-CM | POA: Diagnosis not present

## 2019-06-23 HISTORY — PX: THROMBECTOMY W/ EMBOLECTOMY: SHX2507

## 2019-06-23 HISTORY — PX: A/V FISTULAGRAM: CATH118298

## 2019-06-23 HISTORY — PX: REVISON OF ARTERIOVENOUS FISTULA: SHX6074

## 2019-06-23 HISTORY — PX: PATCH ANGIOPLASTY: SHX6230

## 2019-06-23 LAB — POCT I-STAT, CHEM 8
BUN: 43 mg/dL — ABNORMAL HIGH (ref 8–23)
Calcium, Ion: 1.23 mmol/L (ref 1.15–1.40)
Chloride: 97 mmol/L — ABNORMAL LOW (ref 98–111)
Creatinine, Ser: 13.1 mg/dL — ABNORMAL HIGH (ref 0.61–1.24)
Glucose, Bld: 88 mg/dL (ref 70–99)
HCT: 33 % — ABNORMAL LOW (ref 39.0–52.0)
Hemoglobin: 11.2 g/dL — ABNORMAL LOW (ref 13.0–17.0)
Potassium: 5.5 mmol/L — ABNORMAL HIGH (ref 3.5–5.1)
Sodium: 137 mmol/L (ref 135–145)
TCO2: 28 mmol/L (ref 22–32)

## 2019-06-23 SURGERY — A/V FISTULAGRAM
Anesthesia: LOCAL | Laterality: Left

## 2019-06-23 SURGERY — REVISON OF ARTERIOVENOUS FISTULA
Anesthesia: Monitor Anesthesia Care | Site: Arm Upper | Laterality: Left

## 2019-06-23 MED ORDER — CEFAZOLIN SODIUM-DEXTROSE 2-3 GM-%(50ML) IV SOLR
INTRAVENOUS | Status: DC | PRN
  Administered 2019-06-23: 2 g via INTRAVENOUS

## 2019-06-23 MED ORDER — SODIUM CHLORIDE 0.9 % IV SOLN
INTRAVENOUS | Status: DC | PRN
  Administered 2019-06-23: 500 mL

## 2019-06-23 MED ORDER — FENTANYL CITRATE (PF) 100 MCG/2ML IJ SOLN
INTRAMUSCULAR | Status: DC | PRN
  Administered 2019-06-23: 25 ug via INTRAVENOUS

## 2019-06-23 MED ORDER — PHENYLEPHRINE HCL-NACL 10-0.9 MG/250ML-% IV SOLN
INTRAVENOUS | Status: DC | PRN
  Administered 2019-06-23: 30 ug/min via INTRAVENOUS

## 2019-06-23 MED ORDER — HEPARIN (PORCINE) IN NACL 1000-0.9 UT/500ML-% IV SOLN
INTRAVENOUS | Status: AC
  Filled 2019-06-23: qty 500

## 2019-06-23 MED ORDER — LIDOCAINE HCL (PF) 1 % IJ SOLN
INTRAMUSCULAR | Status: DC | PRN
  Administered 2019-06-23: 30 mL
  Administered 2019-06-23: 5 mL

## 2019-06-23 MED ORDER — PROTAMINE SULFATE 10 MG/ML IV SOLN
INTRAVENOUS | Status: DC | PRN
  Administered 2019-06-23: 40 mg via INTRAVENOUS

## 2019-06-23 MED ORDER — FENTANYL CITRATE (PF) 100 MCG/2ML IJ SOLN
INTRAMUSCULAR | Status: DC | PRN
  Administered 2019-06-23: 50 ug via INTRAVENOUS

## 2019-06-23 MED ORDER — PROPOFOL 10 MG/ML IV BOLUS
INTRAVENOUS | Status: AC
  Filled 2019-06-23: qty 20

## 2019-06-23 MED ORDER — OXYCODONE-ACETAMINOPHEN 5-325 MG PO TABS: 1.0000 | ORAL_TABLET | Freq: Four times a day (QID) | ORAL | 0 refills | Status: DC | PRN

## 2019-06-23 MED ORDER — LIDOCAINE HCL (PF) 1 % IJ SOLN
INTRAMUSCULAR | Status: AC
  Filled 2019-06-23: qty 30

## 2019-06-23 MED ORDER — SODIUM CHLORIDE 0.9 % IV SOLN
INTRAVENOUS | Status: AC
  Filled 2019-06-23: qty 1.2

## 2019-06-23 MED ORDER — SODIUM CHLORIDE 0.9 % IV SOLN: INTRAVENOUS | Status: DC

## 2019-06-23 MED ORDER — MIDAZOLAM HCL 2 MG/2ML IJ SOLN
INTRAMUSCULAR | Status: AC
  Filled 2019-06-23: qty 2

## 2019-06-23 MED ORDER — CEFAZOLIN SODIUM-DEXTROSE 2-4 GM/100ML-% IV SOLN
INTRAVENOUS | Status: AC
  Filled 2019-06-23: qty 100

## 2019-06-23 MED ORDER — FENTANYL CITRATE (PF) 100 MCG/2ML IJ SOLN
INTRAMUSCULAR | Status: AC
  Filled 2019-06-23: qty 2

## 2019-06-23 MED ORDER — ONDANSETRON HCL 4 MG/2ML IJ SOLN
INTRAMUSCULAR | Status: DC | PRN
  Administered 2019-06-23: 4 mg via INTRAVENOUS

## 2019-06-23 MED ORDER — MIDAZOLAM HCL 2 MG/2ML IJ SOLN
INTRAMUSCULAR | Status: DC | PRN
  Administered 2019-06-23: 1 mg via INTRAVENOUS

## 2019-06-23 MED ORDER — ONDANSETRON HCL 4 MG/2ML IJ SOLN: 4.0000 mg | Freq: Once | INTRAMUSCULAR | Status: DC | PRN

## 2019-06-23 MED ORDER — THROMBIN (RECOMBINANT) 20000 UNITS EX SOLR
CUTANEOUS | Status: AC
  Filled 2019-06-23: qty 20000

## 2019-06-23 MED ORDER — PROPOFOL 500 MG/50ML IV EMUL
INTRAVENOUS | Status: DC | PRN
  Administered 2019-06-23: 30 mg via INTRAVENOUS
  Administered 2019-06-23: 100 ug/kg/min via INTRAVENOUS

## 2019-06-23 MED ORDER — SODIUM CHLORIDE 0.9% FLUSH: 3.0000 mL | INTRAVENOUS | Status: DC | PRN

## 2019-06-23 MED ORDER — LIDOCAINE HCL (PF) 1 % IJ SOLN
INTRAMUSCULAR | Status: DC | PRN
  Administered 2019-06-23: 4 mL

## 2019-06-23 MED ORDER — HEPARIN SODIUM (PORCINE) 1000 UNIT/ML IJ SOLN
INTRAMUSCULAR | Status: DC | PRN
  Administered 2019-06-23: 8000 [IU] via INTRAVENOUS

## 2019-06-23 MED ORDER — LACTATED RINGERS IV SOLN: INTRAVENOUS | Status: DC

## 2019-06-23 MED ORDER — MIDAZOLAM HCL 5 MG/5ML IJ SOLN
INTRAMUSCULAR | Status: DC | PRN
  Administered 2019-06-23: 1 mg via INTRAVENOUS

## 2019-06-23 MED ORDER — HEPARIN (PORCINE) IN NACL 1000-0.9 UT/500ML-% IV SOLN
INTRAVENOUS | Status: DC | PRN
  Administered 2019-06-23: 500 mL

## 2019-06-23 MED ORDER — FENTANYL CITRATE (PF) 100 MCG/2ML IJ SOLN: 25.0000 ug | INTRAMUSCULAR | Status: DC | PRN

## 2019-06-23 MED ORDER — PHENYLEPHRINE 40 MCG/ML (10ML) SYRINGE FOR IV PUSH (FOR BLOOD PRESSURE SUPPORT)
PREFILLED_SYRINGE | INTRAVENOUS | Status: DC | PRN
  Administered 2019-06-23: 80 ug via INTRAVENOUS
  Administered 2019-06-23: 40 ug via INTRAVENOUS

## 2019-06-23 MED ORDER — FENTANYL CITRATE (PF) 250 MCG/5ML IJ SOLN
INTRAMUSCULAR | Status: AC
  Filled 2019-06-23: qty 5

## 2019-06-23 MED FILL — OXYCODONE-APAP 5-325MG: 5-325 | 4 days supply | Qty: 10 | Fill #0

## 2019-06-23 SURGICAL SUPPLY — 37 items
ADH SKN CLS APL DERMABOND .7 (GAUZE/BANDAGES/DRESSINGS) ×1
ARMBAND PINK RESTRICT EXTREMIT (MISCELLANEOUS) ×2 IMPLANT
CANISTER SUCT 3000ML PPV (MISCELLANEOUS) ×2 IMPLANT
CANNULA VESSEL 3MM 2 BLNT TIP (CANNULA) ×2 IMPLANT
CATH EMB 4FR 40CM (CATHETERS) ×1 IMPLANT
CATH EMB 5FR 80CM (CATHETERS) ×1 IMPLANT
CLIP VESOCCLUDE MED 6/CT (CLIP) ×2 IMPLANT
CLIP VESOCCLUDE SM WIDE 6/CT (CLIP) ×2 IMPLANT
COVER PROBE W GEL 5X96 (DRAPES) IMPLANT
COVER WAND RF STERILE (DRAPES) ×2 IMPLANT
DECANTER SPIKE VIAL GLASS SM (MISCELLANEOUS) ×1 IMPLANT
DERMABOND ADVANCED (GAUZE/BANDAGES/DRESSINGS) ×1
DERMABOND ADVANCED .7 DNX12 (GAUZE/BANDAGES/DRESSINGS) ×1 IMPLANT
DRAPE HALF SHEET 40X57 (DRAPES) ×1 IMPLANT
ELECT REM PT RETURN 9FT ADLT (ELECTROSURGICAL) ×2
ELECTRODE REM PT RTRN 9FT ADLT (ELECTROSURGICAL) ×1 IMPLANT
GLOVE BIO SURGEON STRL SZ7.5 (GLOVE) ×2 IMPLANT
GLOVE BIOGEL PI IND STRL 8 (GLOVE) ×1 IMPLANT
GLOVE BIOGEL PI INDICATOR 8 (GLOVE) ×1
GOWN STRL REUS W/ TWL LRG LVL3 (GOWN DISPOSABLE) ×3 IMPLANT
GOWN STRL REUS W/TWL LRG LVL3 (GOWN DISPOSABLE) ×6
KIT BASIN OR (CUSTOM PROCEDURE TRAY) ×2 IMPLANT
KIT TURNOVER KIT B (KITS) ×2 IMPLANT
NS IRRIG 1000ML POUR BTL (IV SOLUTION) ×2 IMPLANT
PACK CV ACCESS (CUSTOM PROCEDURE TRAY) ×2 IMPLANT
PAD ARMBOARD 7.5X6 YLW CONV (MISCELLANEOUS) ×4 IMPLANT
PATCH VASC XENOSURE 1CMX6CM (Vascular Products) ×2 IMPLANT
PATCH VASC XENOSURE 1X6 (Vascular Products) IMPLANT
SPONGE SURGIFOAM ABS GEL 100 (HEMOSTASIS) IMPLANT
SUT PROLENE 6 0 BV (SUTURE) ×3 IMPLANT
SUT VIC AB 3-0 SH 27 (SUTURE) ×2
SUT VIC AB 3-0 SH 27X BRD (SUTURE) ×1 IMPLANT
SUT VICRYL 4-0 PS2 18IN ABS (SUTURE) ×2 IMPLANT
SYR 3ML LL SCALE MARK (SYRINGE) ×1 IMPLANT
TOWEL GREEN STERILE (TOWEL DISPOSABLE) ×2 IMPLANT
UNDERPAD 30X30 (UNDERPADS AND DIAPERS) ×2 IMPLANT
WATER STERILE IRR 1000ML POUR (IV SOLUTION) ×2 IMPLANT

## 2019-06-23 SURGICAL SUPPLY — 9 items
BAG SNAP BAND KOVER 36X36 (MISCELLANEOUS) ×2 IMPLANT
COVER DOME SNAP 22 D (MISCELLANEOUS) ×2 IMPLANT
KIT MICROPUNCTURE NIT STIFF (SHEATH) ×1 IMPLANT
PROTECTION STATION PRESSURIZED (MISCELLANEOUS) ×2
SHEATH PROBE COVER 6X72 (BAG) ×3 IMPLANT
STATION PROTECTION PRESSURIZED (MISCELLANEOUS) ×1 IMPLANT
STOPCOCK MORSE 400PSI 3WAY (MISCELLANEOUS) ×2 IMPLANT
TRAY PV CATH (CUSTOM PROCEDURE TRAY) ×2 IMPLANT
TUBING CIL FLEX 10 FLL-RA (TUBING) ×2 IMPLANT

## 2019-06-23 NOTE — Interval H&P Note (Signed)
History and Physical Interval Note:  06/23/2019 10:18 AM  [name]  has presented today for surgery, with the diagnosis of Complications of AVF.  The various methods of treatment have been discussed with the patient and family. After consideration of risks, benefits and other options for treatment, the patient has consented to  Procedure(s): REVISION OF LEFT UPPER ARTERIOVENOUS FISTULA (Left) as a surgical intervention.  The patient's history has been reviewed, patient examined, no change in status, stable for surgery.  I have reviewed the patient's chart and labs.  Questions were answered to the patient's satisfaction.     Deitra Mayo

## 2019-06-23 NOTE — Interval H&P Note (Signed)
History and Physical Interval Note:  06/23/2019 7:29 AM  [name]  has presented today for surgery, with the diagnosis of complication of fistula.  The various methods of treatment have been discussed with the patient and family. After consideration of risks, benefits and other options for treatment, the patient has consented to  Procedure(s): A/V FISTULAGRAM (Left) as a surgical intervention.  The patient's history has been reviewed, patient examined, no change in status, stable for surgery.  I have reviewed the patient's chart and labs.  Questions were answered to the patient's satisfaction.     [name]

## 2019-06-23 NOTE — Progress Notes (Signed)
   Patient name: [name] MRN: [id number] DOB: Jan 31, 1956 Sex: male    HISTORY OF PRESENT ILLNESS:   Manford Eckhardt is a 64 y.o. male with end-stage renal disease who is status post left basilic vein fistula creation.  In November 2020, he came down with low access flows and was found to have a high-grade lesion under the axilla.  This was treated with balloon angioplasty.  He has had a early recurrence.  He underwent fistulogram today which identified a nearly occlusive lesion of the of the axilla.  CURRENT MEDICATIONS:    Current Facility-Administered Medications  Medication Dose Route Frequency Provider Last Rate Last Admin  . 0.9 %  sodium chloride infusion   Intravenous Continuous Suzette Battiest, MD      . 0.9 %  sodium chloride infusion   Intravenous Continuous Suzette Battiest, MD 10 mL/hr at 06/23/19 0832 New Bag at 06/23/19 [id number]  . lactated ringers infusion   Intravenous Continuous Suzette Battiest, MD      . sodium chloride flush (NS) 0.9 % injection 3 mL  3 mL Intravenous PRN Serafina Mitchell, MD        REVIEW OF SYSTEMS:   [X]  denotes positive finding, [ ]  denotes negative finding Cardiac  Comments:  Chest pain or chest pressure:    Shortness of breath upon exertion:    Short of breath when lying flat:    Irregular heart rhythm:    Constitutional    Fever or chills:      PHYSICAL EXAM:   Vitals:   06/23/19 0544 06/23/19 0733  BP: 123/75   Pulse: 76   Resp: 17   Temp: 98.3 F (36.8 C)   TempSrc: Skin   SpO2: 100% 100%  Weight: 83.9 kg   Height: 6\' 1"  (1.854 m)     GENERAL: The patient is a well-nourished male, in no acute distress. The vital signs are documented above. CARDIOVASCULAR: There is a regular rate and rhythm. PULMONARY: Non-labored respirations Patent left basilic vein fistula  STUDIES:   Fistulogram today shows high-grade lesion of the of the axilla   MEDICAL ISSUES:   I discussed with the  patient given the limited durability of the previous angioplasty, as well as the appearance of this lesion, that I think this would be best addressed surgically either by interposition graft, patch angioplasty, or resection of the stenotic area and reconnection of the fistula.  He understands that Dr. Scot Dock is available to do this later today.  He'll be kept in the hospital for surgery later this afternoon.  Leia Alf, MD, FACS [facility] [phone] Pager [phone]

## 2019-06-23 NOTE — Op Note (Addendum)
    Patient name: [name] MRN: [id number] DOB: 29-Jan-1956 Sex: male  06/23/2019 Pre-operative Diagnosis: End-stage renal disease Post-operative diagnosis:  Same Surgeon:  [name] Procedure Performed:  1.  Ultrasound-guided access, left arm fistula  2.  Fistulogram  3.  Conscious sedation, 26 minutes    Indications: The patient is status post left basilic vein fistula creation.  About 2 months ago, he underwent fistulogram which identified a high-grade lesion of near the shoulder which was treated with balloon angioplasty.  He has had a recurrence and comes back today for further evaluation.  Procedure:  The patient was identified in the holding area and taken to room 8.  The patient was then placed supine on the table and prepped and draped in the usual sterile fashion.  A time out was called.  Conscious sedation was administered with the use of IV fentanyl and Versed under continuous physician and nurse monitoring.  Heart rate, blood pressure, and oxygen saturations were continuously monitored.  Total sedation time was 26 minutes ultrasound was used to evaluate the fistula.  The vein was patent and compressible.  A digital ultrasound image was acquired.  The fistula was then accessed under ultrasound guidance using a micropuncture needle.  An 018 wire was then asvanced without resistance and a micropuncture sheath was placed.  Contrast injections were then performed through the sheath.  Findings: No central venous stenosis.  The arterial venous anastomosis is widely patent.  There is small luminal irregularity near the arterial venous anastomosis likely related to spasm.  There is a 99% stenosis of the axilla.   Intervention: Suture closure of the sheath was performed with 4-0 Monocryl  Impression:  #1  No central venous stenosis  #2  Widely patent arterial venous anastomosis  #3  99% stenosis within the basilic vein up near the axilla.  Surgical correction is recommended.    Theotis Burrow, M.D., [facility] [place] Office: [phone] Pager:  [phone]

## 2019-06-23 NOTE — Op Note (Signed)
    NAME: [name]    MRN: [id number] DOB: 08/19/1955    DATE OF OPERATION: 06/23/2019  PREOP DIAGNOSIS:    Failing left upper arm fistula  POSTOP DIAGNOSIS:    Seen  PROCEDURE:    Revision of left basilic vein transposition with vein patch angioplasty of basilic vein Thrombectomy left basilic vein transposition  SURGEON: Judeth Cornfield. Scot Dock, MD  ASSIST: Leontine Locket, PA  ANESTHESIA: Local with sedation  EBL: Minimal  INDICATIONS:    [name] is a 64 y.o. male who had a second stage basilic vein transposition done in February.  He had a fistulogram today which showed a tight narrowing up near the axilla and I was asked to revise this.  FINDINGS:   There was marked intimal hyperplasia over a fairly long length at the area of concern.  There was also some thrombus within the fistula which identified with duplex.  I performed thrombectomy and did patch angioplasty of this area of intimal hyperplasia.  This was associated with 2 valve sites.  TECHNIQUE:   The patient was taken to the operating room and sedated.  The left upper extremity was prepped and draped in usual sterile fashion.  After the skin was anesthetized with 1% lidocaine a longitudinal incision was made over the area of concern and here the fistula was dissected free.  The patient was heparinized.  The area of concern was between 2 valves.  The vein was clamped proximally and distally and a longitudinal arteriotomy was made.  There was marked intimal hyperplasia present.  Centrally the vein was patent.  Distally the intimal hyperplasia continue to wide I extended the incision to get back to a normal appearing lumen.  There was clot present here and I excised a markedly thickened valve leaflet.  I performed graft thrombectomy using a #3 4 and #5 Fogarty catheter.  Bovine pericardial patch angioplasty was performed with running 6-0 Prolene suture.  The completion there was an excellent thrill in the fistula.   Heparin was partially reversed with protamine.  The wound was then closed with a deep layer of 3-0 Vicryl and the skin closed with 4-0 Vicryl.  Dermabond was applied.  The patient tolerated the procedure well was transferred to recovery room in stable condition.  All needle and sponge counts were correct.  Deitra Mayo, MD, FACS [facility]  DATE OF DICTATION:   06/23/2019

## 2019-06-23 NOTE — Transfer of Care (Signed)
Immediate Anesthesia Transfer of Care Note  Patient: [name]  Procedure(s) Performed: REVISION OF LEFT UPPER BASILIC VEIN TRANSPOSITION (Left Arm Upper) Thrombectomy of Left Basilic Vein Transposition (Left Arm Upper) Patch Angioplasty of Left Basilic Vein Transpostion (Left Arm Upper)  Patient Location: PACU  Anesthesia Type:MAC  Level of Consciousness: drowsy  Airway & Oxygen Therapy: Patient Spontanous Breathing  Post-op Assessment: Report given to RN and Post -op Vital signs reviewed and stable  Post vital signs: Reviewed and stable  Last Vitals:  Vitals Value Taken Time  BP 101/39 06/23/19 1230  Temp 36.7 C 06/23/19 1230  Pulse 68 06/23/19 1233  Resp 20 06/23/19 1233  SpO2 100 % 06/23/19 1233  Vitals shown include unvalidated device data.  Last Pain:  Vitals:   06/23/19 0753  TempSrc:   PainSc: 0-No pain      Patients Stated Pain Goal: 3 (45/80/99 8338)  Complications: No apparent anesthesia complications

## 2019-06-23 NOTE — Anesthesia Postprocedure Evaluation (Signed)
Anesthesia Post Note  Patient: [name]  Procedure(s) Performed: REVISION OF LEFT UPPER BASILIC VEIN TRANSPOSITION (Left Arm Upper) Thrombectomy of Left Basilic Vein Transposition (Left Arm Upper) Patch Angioplasty of Left Basilic Vein Transpostion (Left Arm Upper)     Patient location during evaluation: PACU Anesthesia Type: MAC Level of consciousness: awake and alert Pain management: pain level controlled Vital Signs Assessment: post-procedure vital signs reviewed and stable Respiratory status: spontaneous breathing, nonlabored ventilation, respiratory function stable and patient connected to nasal cannula oxygen Cardiovascular status: stable and blood pressure returned to baseline Postop Assessment: no apparent nausea or vomiting Anesthetic complications: no    Last Vitals:  Vitals:   06/23/19 1258 06/23/19 1324  BP: (!) 117/54 (!) 105/57  Pulse: 72 72  Resp: 12 16  Temp: 36.7 C   SpO2: 99% 98%    Last Pain:  Vitals:   06/23/19 1324  TempSrc:   PainSc: 0-No pain                 Tiajuana Amass

## 2019-06-23 NOTE — Discharge Instructions (Signed)
Dialysis Fistulogram, Care After This sheet gives you information about how to care for yourself after your procedure. Your health care provider may also give you more specific instructions. If you have problems or questions, contact your health care provider. What can I expect after the procedure? After the procedure, it is common to have:  A small amount of discomfort in the area where the small, thin tube (catheter) was placed for the procedure.  A small amount of bruising around the fistula.  Sleepiness and tiredness (fatigue). Follow these instructions at home: Activity   Rest at home and do not lift anything that is heavier than 5 lb (2.3 kg) on the day after your procedure.  Return to your normal activities as told by your health care provider. Ask your health care provider what activities are safe for you.  Do not drive or use heavy machinery while taking prescription pain medicine.  Do not drive for 24 hours if you were given a medicine to help you relax (sedative) during your procedure. Medicines   Take over-the-counter and prescription medicines only as told by your health care provider. Puncture site care  Follow instructions from your health care provider about how to take care of the site where catheters were inserted. Make sure you: ? Wash your hands with soap and water before you change your bandage (dressing). If soap and water are not available, use hand sanitizer. ? Change your dressing as told by your health care provider. ? Leave stitches (sutures), skin glue, or adhesive strips in place. These skin closures may need to stay in place for 2 weeks or longer. If adhesive strip edges start to loosen and curl up, you may trim the loose edges. Do not remove adhesive strips completely unless your health care provider tells you to do that.  Check your puncture area every day for signs of infection. Check for: ? Redness, swelling, or pain. ? Fluid or  blood. ? Warmth. ? Pus or a bad smell. General instructions  Do not take baths, swim, or use a hot tub until your health care provider approves. Ask your health care provider if you may take showers. You may only be allowed to take sponge baths.  Monitor your dialysis fistula closely. Check to make sure that you can feel a vibration or buzz (a thrill) when you put your fingers over the fistula.  Prevent damage to your graft or fistula: ? Do not wear tight-fitting clothing or jewelry on the arm or leg that has your graft or fistula. ? Tell all your health care providers that you have a dialysis fistula or graft. ? Do not allow blood draws, IVs, or blood pressure readings to be done in the arm that has your fistula or graft. ? Do not allow flu shots or vaccinations in the arm with your fistula or graft.  Keep all follow-up visits as told by your health care provider. This is important. Contact a health care provider if:  You have redness, swelling, or pain at the site where the catheter was put in.  You have fluid or blood coming from the catheter site.  The catheter site feels warm to the touch.  You have pus or a bad smell coming from the catheter site.  You have a fever or chills. Get help right away if:  You feel weak.  You have trouble balancing.  You have trouble moving your arms or legs.  You have problems with your speech or vision.  You   can no longer feel a vibration or buzz when you put your fingers over your dialysis fistula.  The limb that was used for the procedure: ? Swells. ? Is painful. ? Is cold. ? Is discolored, such as blue or pale white.  You have chest pain or shortness of breath. Summary  After a dialysis fistulogram, it is common to have a small amount of discomfort or bruising in the area where the small, thin tube (catheter) was placed.  Rest at home on the day after your procedure. Return to your normal activities as told by your health care  provider.  Take over-the-counter and prescription medicines only as told by your health care provider.  Follow instructions from your health care provider about how to take care of the site where the catheter was inserted.  Keep all follow-up visits as told by your health care provider. This information is not intended to replace advice given to you by your health care provider. Make sure you discuss any questions you have with your health care provider. Document Revised: 07/05/2017 Document Reviewed: 07/05/2017 Elsevier Patient Education  2020 [employer].    [facility]  Discharge Instructions  AV Fistula or Graft Surgery for Dialysis Access  Please refer to the following instructions for your post-procedure care. Your surgeon or physician assistant will discuss any changes with you.  Activity  You may drive the day following your surgery, if you are comfortable and no longer taking prescription pain medication. Resume full activity as the soreness in your incision resolves.  Bathing/Showering  You may shower after you go home. Keep your incision dry for 48 hours. Do not soak in a bathtub, hot tub, or swim until the incision heals completely. You may not shower if you have a hemodialysis catheter.  Incision Care  Clean your incision with mild soap and water after 48 hours. Pat the area dry with a clean towel. You do not need a bandage unless otherwise instructed. Do not apply any ointments or creams to your incision. You may have skin glue on your incision. Do not peel it off. It will come off on its own in about one week. Your arm may swell a bit after surgery. To reduce swelling use pillows to elevate your arm so it is above your heart. Your doctor will tell you if you need to lightly wrap your arm with an ACE bandage.  Diet  Resume your normal diet. There are not special food restrictions following this procedure. In order to heal from your  surgery, it is CRITICAL to get adequate nutrition. Your body requires vitamins, minerals, and protein. Vegetables are the best source of vitamins and minerals. Vegetables also provide the perfect balance of protein. Processed food has little nutritional value, so try to avoid this.  Medications  Resume taking all of your medications. If your incision is causing pain, you may take over-the counter pain relievers such as acetaminophen (Tylenol). If you were prescribed a stronger pain medication, please be aware these medications can cause nausea and constipation. Prevent nausea by taking the medication with a snack or meal. Avoid constipation by drinking plenty of fluids and eating foods with high amount of fiber, such as fruits, vegetables, and grains.  Do not take Tylenol if you are taking prescription pain medications.  Follow up Your surgeon may want to see you in the office following your access surgery. If so, this will be arranged at the time of your surgery.  Please call us immediately for any of the following conditions:  . Increased pain, redness, drainage (pus) from your incision site . Fever of 101 degrees or higher . Severe or worsening pain at your incision site . Hand pain or numbness. .  Reduce your risk of vascular disease:  . Stop smoking. If you would like help, call [facility] at 1-800-QUIT-NOW 458 147 5546) or [place] at [phone]  . Manage your cholesterol . Maintain a desired weight . Control your diabetes . Keep your blood pressure down  Dialysis  It will take several weeks to several months for your new dialysis access to be ready for use. Your surgeon will determine when it is okay to use it. Your nephrologist will continue to direct your dialysis. You can continue to use your Permcath until your new access is ready for use.   06/23/2019 Kendle Bruster [id number] 08-10-1955  Surgeon(s): Angelia Mould, MD  Procedure(s): REVISION OF LEFT  UPPER BASILIC VEIN TRANSPOSITION Thrombectomy of Left Basilic Vein Transposition Patch Angioplasty of Left Basilic Vein Transpostion   x May stick graft on designated area only:  Do NOT EVER stick over incision. May stick below incision immediately.   If you have any questions, please call the office at [phone].

## 2019-06-23 NOTE — Anesthesia Preprocedure Evaluation (Addendum)
Anesthesia Evaluation  Patient identified by MRN, date of birth, ID band Patient awake    Reviewed: Allergy & Precautions, NPO status , Patient's Chart, lab work & pertinent test results  Airway Mallampati: II  TM Distance: >3 FB Neck ROM: Full    Dental  (+) Dental Advisory Given   Pulmonary sleep apnea ,    breath sounds clear to auscultation       Cardiovascular hypertension, Pt. on medications +CHF   Rhythm:Regular Rate:Normal     Neuro/Psych negative neurological ROS     GI/Hepatic negative GI ROS, Neg liver ROS,   Endo/Other  negative endocrine ROS  Renal/GU CRFRenal disease     Musculoskeletal   Abdominal   Peds  Hematology  (+) anemia ,   Anesthesia Other Findings   Reproductive/Obstetrics                             Anesthesia Physical Anesthesia Plan  ASA: III  Anesthesia Plan: MAC   Post-op Pain Management:    Induction: Intravenous  PONV Risk Score and Plan: 2 and Ondansetron, Treatment may vary due to age or medical condition and Propofol infusion  Airway Management Planned: Natural Airway and Simple Face Mask  Additional Equipment:   Intra-op Plan:   Post-operative Plan:   Informed Consent: I have reviewed the patients History and Physical, chart, labs and discussed the procedure including the risks, benefits and alternatives for the proposed anesthesia with the patient or authorized representative who has indicated his/her understanding and acceptance.     Dental advisory given  Plan Discussed with: CRNA  Anesthesia Plan Comments:        Anesthesia Quick Evaluation

## 2019-08-18 ENCOUNTER — Ambulatory Visit: Payer: 59

## 2019-08-18 ENCOUNTER — Encounter (HOSPITAL_COMMUNITY): Payer: 59

## 2020-01-01 DIAGNOSIS — T7840XA Allergy, unspecified, initial encounter: Secondary | ICD-10-CM | POA: Insufficient documentation

## 2020-01-01 DIAGNOSIS — T782XXA Anaphylactic shock, unspecified, initial encounter: Secondary | ICD-10-CM | POA: Insufficient documentation

## 2020-03-04 ENCOUNTER — Other Ambulatory Visit: Payer: Self-pay

## 2020-03-04 MED ORDER — SODIUM CHLORIDE 0.9 % IV SOLN: 250.0000 mL | INTRAVENOUS | Status: AC | PRN

## 2020-03-15 ENCOUNTER — Encounter (HOSPITAL_COMMUNITY)
Admission: RE | Disposition: A | Discharge status: Home / Self Care | Payer: Self-pay | Source: Home / Self Care | Attending: Surgery

## 2020-03-15 ENCOUNTER — Encounter (HOSPITAL_COMMUNITY): Payer: Self-pay | Admitting: Surgery

## 2020-03-15 ENCOUNTER — Other Ambulatory Visit: Payer: Self-pay

## 2020-03-15 ENCOUNTER — Ambulatory Visit (HOSPITAL_COMMUNITY)
Admission: RE | Admit: 2020-03-15 | Discharge: 2020-03-15 | Disposition: A | Discharge status: Home / Self Care | Payer: 59 | Attending: Surgery | Admitting: Surgery

## 2020-03-15 DIAGNOSIS — Z20822 Contact with and (suspected) exposure to covid-19: Secondary | ICD-10-CM | POA: Diagnosis not present

## 2020-03-15 DIAGNOSIS — T82898A Other specified complication of vascular prosthetic devices, implants and grafts, initial encounter: Secondary | ICD-10-CM

## 2020-03-15 DIAGNOSIS — Z992 Dependence on renal dialysis: Secondary | ICD-10-CM

## 2020-03-15 DIAGNOSIS — Y841 Kidney dialysis as the cause of abnormal reaction of the patient, or of later complication, without mention of misadventure at the time of the procedure: Secondary | ICD-10-CM | POA: Diagnosis not present

## 2020-03-15 DIAGNOSIS — N186 End stage renal disease: Secondary | ICD-10-CM | POA: Insufficient documentation

## 2020-03-15 DIAGNOSIS — T82858A Stenosis of vascular prosthetic devices, implants and grafts, initial encounter: Secondary | ICD-10-CM | POA: Insufficient documentation

## 2020-03-15 HISTORY — PX: A/V FISTULAGRAM: CATH118298

## 2020-03-15 HISTORY — PX: PERIPHERAL VASCULAR BALLOON ANGIOPLASTY: CATH118281

## 2020-03-15 LAB — RESPIRATORY PANEL BY RT PCR (FLU A&B, COVID)
Influenza A by PCR: NEGATIVE
Influenza B by PCR: NEGATIVE
SARS Coronavirus 2 by RT PCR: NEGATIVE

## 2020-03-15 LAB — POCT I-STAT, CHEM 8
BUN: 42 mg/dL — ABNORMAL HIGH (ref 8–23)
Calcium, Ion: 1.14 mmol/L — ABNORMAL LOW (ref 1.15–1.40)
Chloride: 99 mmol/L (ref 98–111)
Creatinine, Ser: 13.2 mg/dL — ABNORMAL HIGH (ref 0.61–1.24)
Glucose, Bld: 92 mg/dL (ref 70–99)
HCT: 29 % — ABNORMAL LOW (ref 39.0–52.0)
Hemoglobin: 9.9 g/dL — ABNORMAL LOW (ref 13.0–17.0)
Potassium: 5.2 mmol/L — ABNORMAL HIGH (ref 3.5–5.1)
Sodium: 138 mmol/L (ref 135–145)
TCO2: 30 mmol/L (ref 22–32)

## 2020-03-15 SURGERY — A/V FISTULAGRAM
Anesthesia: LOCAL | Laterality: Left

## 2020-03-15 MED ORDER — SODIUM CHLORIDE 0.9% FLUSH: 3.0000 mL | INTRAVENOUS | Status: DC | PRN

## 2020-03-15 MED ORDER — SODIUM CHLORIDE 0.9% FLUSH: 3.0000 mL | Freq: Two times a day (BID) | INTRAVENOUS | Status: DC

## 2020-03-15 MED ORDER — HEPARIN (PORCINE) IN NACL 1000-0.9 UT/500ML-% IV SOLN
INTRAVENOUS | Status: AC
  Filled 2020-03-15: qty 500

## 2020-03-15 MED ORDER — LIDOCAINE HCL (PF) 1 % IJ SOLN
INTRAMUSCULAR | Status: DC | PRN
  Administered 2020-03-15: 3 mL

## 2020-03-15 MED ORDER — LIDOCAINE HCL (PF) 1 % IJ SOLN
INTRAMUSCULAR | Status: AC
  Filled 2020-03-15: qty 30

## 2020-03-15 MED ORDER — IODIXANOL 320 MG/ML IV SOLN
INTRAVENOUS | Status: DC | PRN
  Administered 2020-03-15: 40 mL

## 2020-03-15 MED ORDER — HEPARIN (PORCINE) IN NACL 1000-0.9 UT/500ML-% IV SOLN
INTRAVENOUS | Status: DC | PRN
  Administered 2020-03-15: 500 mL

## 2020-03-15 SURGICAL SUPPLY — 16 items
BALLN MUSTANG 10X80X75 (BALLOONS) ×3
BALLOON MUSTANG 10X80X75 (BALLOONS) IMPLANT
COVER DOME SNAP 22 D (MISCELLANEOUS) ×3 IMPLANT
DCB RANGER 7.0X80 135 (BALLOONS) IMPLANT
KIT ENCORE 26 ADVANTAGE (KITS) ×1 IMPLANT
KIT MICROPUNCTURE NIT STIFF (SHEATH) ×1 IMPLANT
PROTECTION STATION PRESSURIZED (MISCELLANEOUS) ×3
RANGER DCB 7.0X80 135 (BALLOONS) ×3
SHEATH PINNACLE R/O II 6F 4CM (SHEATH) ×1 IMPLANT
SHEATH PROBE COVER 6X72 (BAG) ×1 IMPLANT
STATION PROTECTION PRESSURIZED (MISCELLANEOUS) ×2 IMPLANT
STOPCOCK MORSE 400PSI 3WAY (MISCELLANEOUS) ×3 IMPLANT
TRAY PV CATH (CUSTOM PROCEDURE TRAY) ×3 IMPLANT
TUBING CIL FLEX 10 FLL-RA (TUBING) ×3 IMPLANT
WIRE SPARTACORE .014X300CM (WIRE) ×1 IMPLANT
WIRE STARTER BENTSON 035X150 (WIRE) ×1 IMPLANT

## 2020-03-15 NOTE — Discharge Instructions (Signed)

## 2020-03-15 NOTE — H&P (Signed)
   Patient name: [name] MRN: [id number] DOB: Oct 01, 1955 Sex: male   HISTORY OF PRESENT ILLNESS:   [name] is a 64 y.o. male with left arm BVT that is having trouble with HD  CURRENT MEDICATIONS:    Current Facility-Administered Medications  Medication Dose Route Frequency Provider Last Rate Last Admin  . sodium chloride flush (NS) 0.9 % injection 3 mL  3 mL Intravenous Q12H Serafina Mitchell, MD      . sodium chloride flush (NS) 0.9 % injection 3 mL  3 mL Intravenous PRN Serafina Mitchell, MD        REVIEW OF SYSTEMS:   [X]  denotes positive finding, [ ]  denotes negative finding Cardiac  Comments:  Chest pain or chest pressure:    Shortness of breath upon exertion:    Short of breath when lying flat:    Irregular heart rhythm:    Constitutional    Fever or chills:      PHYSICAL EXAM:   Vitals:   03/15/20 0612  BP: 139/90  Pulse: 65  Temp: 98.3 F (36.8 C)  TempSrc: Oral  SpO2: 100%  Weight: 88 kg  Height: 6\' 1"  (1.854 m)    GENERAL: The patient is a well-nourished male, in no acute distress. The vital signs are documented above. CARDIOVASCULAR: There is a regular rate and rhythm. PULMONARY: Non-labored respirations   STUDIES:      MEDICAL ISSUES:   Plan for fistulogram and intervention  Leia Alf, MD, FACS [facility] [phone] Pager [phone]

## 2020-03-15 NOTE — Progress Notes (Signed)
Discharge instructions reviewed with patient.  Verbalized understanding.

## 2020-03-15 NOTE — Op Note (Signed)
° ° °  Patient name: [name] MRN: [id number] DOB: 04-23-1956 Sex: male  03/15/2020 Pre-operative Diagnosis: End-stage renal disease Post-operative diagnosis:  Same Surgeon:  [name] Procedure Performed:  1.  Ultrasound-guided access, left basilic vein  2.  Fistulogram  3.  Drug-coated balloon venoplasty, left basilic vein   Indications: The patient is having trouble with access and has a superficial ulceration which is nearly healed.  He is here for fistulogram.  Procedure:  The patient was identified in the holding area and taken to room 8.  The patient was then placed supine on the table and prepped and draped in the usual sterile fashion.  A time out was called.  ultrasound was used to evaluate the fistula.  The vein was patent and compressible.  A digital ultrasound image was acquired.  The fistula was then accessed under ultrasound guidance using a micropuncture needle.  An 018 wire was then asvanced without resistance and a micropuncture sheath was placed.  Contrast injections were then performed through the sheath.  Findings: No central venous stenosis.  The arteriovenous anastomosis was widely patent.  In the midsection of the fistula there is a 60 mm segment with greater than 70% stenosis.   Intervention: After the above images were acquired the decision was made to proceed with intervention.  A 6 French sheath was placed.  I used a 7 x 80 drug-coated Ranger balloon to perform balloon venoplasty for 3 minutes.  There was significant improvement within the stenosis.  I upsized to a 10 x 80 Mustang balloon and repeated balloon venoplasty.  Following completion there was no residual stenosis.  Pursestring suture was used for closure  Impression:  #1 greater than 70% mid distal stenosis successfully treated with a 7 mm drug-coated balloon followed by a 10 mm balloon    V. [name], M.D., [facility] [place] Office: [phone] Pager:   [phone]

## 2020-07-08 DIAGNOSIS — E785 Hyperlipidemia, unspecified: Secondary | ICD-10-CM | POA: Insufficient documentation

## 2020-11-01 DIAGNOSIS — D696 Thrombocytopenia, unspecified: Secondary | ICD-10-CM | POA: Insufficient documentation

## 2020-11-01 DIAGNOSIS — Z6826 Body mass index (BMI) 26.0-26.9, adult: Secondary | ICD-10-CM | POA: Insufficient documentation

## 2020-11-01 DIAGNOSIS — Z7682 Awaiting organ transplant status: Secondary | ICD-10-CM | POA: Insufficient documentation

## 2020-11-03 DIAGNOSIS — R972 Elevated prostate specific antigen [PSA]: Secondary | ICD-10-CM | POA: Insufficient documentation

## 2020-11-08 ENCOUNTER — Other Ambulatory Visit: Payer: Self-pay

## 2020-11-08 ENCOUNTER — Ambulatory Visit (INDEPENDENT_AMBULATORY_CARE_PROVIDER_SITE_OTHER): Payer: 59 | Admitting: Physician Assistant

## 2020-11-08 VITALS — BP 134/86 | HR 57 | Temp 97.8°F | Resp 20 | Ht 73.0 in | Wt 196.7 lb

## 2020-11-08 DIAGNOSIS — N186 End stage renal disease: Secondary | ICD-10-CM | POA: Diagnosis not present

## 2020-11-08 DIAGNOSIS — Z992 Dependence on renal dialysis: Secondary | ICD-10-CM | POA: Diagnosis not present

## 2020-11-08 NOTE — H&P (View-Only) (Signed)
Office Note     CC:  follow up Requesting Provider:  Jerald Kief, MD  HPI: [name] is a 65 y.o. (03-Jul-1955) male who presents for the evaluation of wounds overlying left arm AV fistula.  Left basilic vein fistula was created by Dr. Trula Slade on 07/25/2018.  He also had fistulogram with venoplasty by Dr. Trula Slade on 05/05/2019.  He states he has had wounds overlying 2 areas of his fistula over the past 6 to 8 weeks.  He is frustrated that he is only now being referred to our office as these have been present for some time.  He denies any bleeding episodes.  He is dialyzing on a Monday Wednesday Friday schedule.  He does not report any trouble with cannulation of fistula.  Past Medical History:  Diagnosis Date  . ANEMIA   . Anxiety   . CHRONIC KIDNEY DISEASE STAGE III (MODERATE)   . Chronic systolic heart failure (HCC)    NICM - LVEF 30% echo 12/2008; improved to 50% on echo 03/2012  . DYSLIPIDEMIA   . ERECTILE DYSFUNCTION, ORGANIC   . Gout    1st attack 04/2015, diet changes to treat same  . Hyperparathyroidism, secondary ([place])   . HYPERTENSION   . OSA (obstructive sleep apnea)    mild, CPAP not recommended 08/2012    Past Surgical History:  Procedure Laterality Date  . A/V FISTULAGRAM Left 05/05/2019   Procedure: A/V FISTULAGRAM;  Surgeon: Serafina Mitchell, MD;  Location: [place] [facility];  Service: Cardiovascular;  Laterality: Left;  . A/V FISTULAGRAM Left 06/23/2019   Procedure: A/V FISTULAGRAM;  Surgeon: Serafina Mitchell, MD;  Location: [place] [facility];  Service: Cardiovascular;  Laterality: Left;  . A/V FISTULAGRAM Left 03/15/2020   Procedure: A/V FISTULAGRAM;  Surgeon: Serafina Mitchell, MD;  Location: [place] [facility];  Service: Cardiovascular;  Laterality: Left;  . BASCILIC VEIN TRANSPOSITION Left 06/05/2018   Procedure: FIRST STAGE BASILIC VEIN TRANSPOSITION LEFT ARM;  Surgeon: Serafina Mitchell, MD;  Location: [place];  Service: Vascular;  Laterality: Left;  .  BASCILIC VEIN TRANSPOSITION Left 07/25/2018   Procedure: LEFT ARM BASILIC VEIN TRANSPOSITION;  Surgeon: Serafina Mitchell, MD;  [facility];  Service: Vascular;  Laterality: Left;  . COLONOSCOPY    . PATCH ANGIOPLASTY Left 06/23/2019   Procedure: Patch Angioplasty of Left Basilic Vein Transpostion;  Surgeon: Angelia Mould, MD;  Location: [place];  Service: Vascular;  Laterality: Left;  . PERIPHERAL VASCULAR BALLOON ANGIOPLASTY  05/05/2019   Procedure: PERIPHERAL VASCULAR BALLOON ANGIOPLASTY;  Surgeon: Serafina Mitchell, MD;  Location: [place] [facility];  Service: Cardiovascular;;  Lt upper arm fistula  . PERIPHERAL VASCULAR BALLOON ANGIOPLASTY  03/15/2020   Procedure: PERIPHERAL VASCULAR BALLOON ANGIOPLASTY;  Surgeon: Serafina Mitchell, MD;  Location: [place] [facility];  Service: Cardiovascular;;  . REVISON OF ARTERIOVENOUS FISTULA Left 06/23/2019   Procedure: REVISION OF LEFT UPPER BASILIC VEIN TRANSPOSITION;  Surgeon: Angelia Mould, MD;  Location: [place];  Service: Vascular;  Laterality: Left;  . THROMBECTOMY W/ EMBOLECTOMY Left 07/25/2018   Procedure: Thrombectomy Arteriovenous Fistula;  Surgeon: Serafina Mitchell, MD;  Location[facility];  Service: Vascular;  Laterality: Left;  . THROMBECTOMY W/ EMBOLECTOMY Left 06/23/2019   Procedure: Thrombectomy of Left Basilic Vein Transposition;  Surgeon: Angelia Mould, MD;  Location: [place];  Service: Vascular;  Laterality: Left;  Marland Kitchen VASECTOMY      Social History   Socioeconomic History  .  Marital status: Legally Separated    Spouse name: Not on file  . Number of children: Not on file  . Years of education: Not on file  . Highest education level: Not on file  Occupational History  . Not on file  Tobacco Use  . Smoking status: Never Smoker  . Smokeless tobacco: Never Used  Vaping Use  . Vaping Use: Never used  Substance and Sexual Activity  . Alcohol use: No    Alcohol/week: 0.0 standard drinks  . Drug use: No  . Sexual  activity: Not on file  Other Topics Concern  . Not on file  Social History Narrative   Lives alone, divorced x's 3   Social Determinants of Health   Financial Resource Strain: Not on file  Food Insecurity: Not on file  Transportation Needs: Not on file  Physical Activity: Not on file  Stress: Not on file  Social Connections: Not on file  Intimate Partner Violence: Not on file    Family History  Problem Relation Age of Onset  . Hypertension Mother   . Hypertension Father   . COPD Father   . Coronary artery disease Father   . Coronary artery disease Brother 51       sudden death with MI    Current Outpatient Medications  Medication Sig Dispense Refill  . allopurinol (ZYLOPRIM) 100 MG tablet Take 100 mg by mouth daily.    . bisoprolol (ZEBETA) 10 MG tablet Take 10 mg by mouth 2 (two) times daily.     . colchicine-probenecid 0.5-500 MG tablet Take 0.5 tablets by mouth 2 (two) times daily as needed (gout flare).     . ezetimibe (ZETIA) 10 MG tablet Take 10 mg by mouth daily.    . hydrALAZINE (APRESOLINE) 50 MG tablet Take 50 mg by mouth 3 (three) times daily.    Marland Kitchen lidocaine-prilocaine (EMLA) cream Apply 1 application topically daily as needed (port access).     . rosuvastatin (CRESTOR) 5 MG tablet Take 5 mg by mouth daily.    . sucroferric oxyhydroxide (VELPHORO) 500 MG chewable tablet Chew 500 mg by mouth See admin instructions. Take 500 mg with each meal and snack     Current Facility-Administered Medications  Medication Dose Route Frequency Provider Last Rate Last Admin  . 0.9 %  sodium chloride infusion  250 mL Intravenous PRN Serafina Mitchell, MD        Allergies  Allergen Reactions  . Amlodipine Swelling  . Iodinated Diagnostic Agents   . Isosorbide     Severe headaches, extreme fatigue, lethargy  . Metolazone Other (See Comments)    Made kidney function worse      REVIEW OF SYSTEMS:   '[X]'$  denotes positive finding, '[ ]'$  denotes negative finding Cardiac   Comments:  Chest pain or chest pressure:    Shortness of breath upon exertion:    Short of breath when lying flat:    Irregular heart rhythm:        Vascular    Pain in calf, thigh, or hip brought on by ambulation:    Pain in feet at night that wakes you up from your sleep:     Blood clot in your veins:    Leg swelling:         Pulmonary    Oxygen at home:    Productive cough:     Wheezing:         Neurologic    Sudden weakness in arms or legs:  Sudden numbness in arms or legs:     Sudden onset of difficulty speaking or slurred speech:    Temporary loss of vision in one eye:     Problems with dizziness:         Gastrointestinal    Blood in stool:     Vomited blood:         Genitourinary    Burning when urinating:     Blood in urine:        Psychiatric    Major depression:         Hematologic    Bleeding problems:    Problems with blood clotting too easily:        Skin    Rashes or ulcers:        Constitutional    Fever or chills:      PHYSICAL EXAMINATION:  Vitals:   11/08/20 1134  BP: 134/86  Pulse: (!) 57  Resp: 20  Temp: 97.8 F (36.6 C)  TempSrc: Temporal  SpO2: 100%  Weight: 196 lb 11.2 oz (89.2 kg)  Height: '6\' 1"'$  (1.854 m)    General:  WDWN in NAD; vital signs documented above Gait: Not observed HENT: WNL, normocephalic Pulmonary: normal non-labored breathing Cardiac: regular HR Abdomen: soft, NT, no masses Skin: without rashes Extremities: Palpable thrill throughout left basilic vein fistula; 2 large areas of scabbing close together in the mid upper arm Musculoskeletal: no muscle wasting or atrophy  Neurologic: A&O X 3;  No focal weakness or paresthesias are detected Psychiatric:  The pt has Normal affect.   ASSESSMENT/PLAN:: 65 y.o. male here for evaluation of wounds overlying left arm fistula  -Patent brachiobasilic fistula with palpable thrill and no signs or symptoms of steal syndrome -There are 2 large dry eschar wounds  relatively close together in the mid upper arm of fistula at risk for spontaneous hemorrhage.  I offered the patient plication involving both areas with 1 incision and likely placement of tunneled dialysis catheter.  Patient was initially adamantly against not only having revision surgery of fistula but also against having placement of a tunneled dialysis catheter.  Dr. Carlis Abbott was asked to evaluate the patient today and also discussed the high risk for spontaneous bleeding from both areas.  Ultimately patient agreed to undergo revision surgery with plication.  His preference would be to have the revision surgery and allow the dialysis center to attempt cannulation of fistula.  If it is then determined a catheter is needed, he will then consent to come back for [facility] placement.  The above procedure will be scheduled on a Tuesday or Thursday at the patient's earliest convenience.  Dagoberto Ligas, PA-C [facility] [phone]  Clinic MD:   Dr. Carlis Abbott was involved in the evaluation and management plan of this patient

## 2020-11-08 NOTE — Progress Notes (Signed)
Office Note     CC:  follow up Requesting Provider:  Jerald Kief, MD  HPI: [name] is a 65 y.o. (July 30, 1955) male who presents for the evaluation of wounds overlying left arm AV fistula.  Left basilic vein fistula was created by Dr. Trula Slade on 07/25/2018.  He also had fistulogram with venoplasty by Dr. Trula Slade on 05/05/2019.  He states he has had wounds overlying 2 areas of his fistula over the past 6 to 8 weeks.  He is frustrated that he is only now being referred to our office as these have been present for some time.  He denies any bleeding episodes.  He is dialyzing on a Monday Wednesday Friday schedule.  He does not report any trouble with cannulation of fistula.  Past Medical History:  Diagnosis Date  . ANEMIA   . Anxiety   . CHRONIC KIDNEY DISEASE STAGE III (MODERATE)   . Chronic systolic heart failure (HCC)    NICM - LVEF 30% echo 12/2008; improved to 50% on echo 03/2012  . DYSLIPIDEMIA   . ERECTILE DYSFUNCTION, ORGANIC   . Gout    1st attack 04/2015, diet changes to treat same  . Hyperparathyroidism, secondary ([place])   . HYPERTENSION   . OSA (obstructive sleep apnea)    mild, CPAP not recommended 08/2012    Past Surgical History:  Procedure Laterality Date  . A/V FISTULAGRAM Left 05/05/2019   Procedure: A/V FISTULAGRAM;  Surgeon: Serafina Mitchell, MD;  Location: [place] [facility];  Service: Cardiovascular;  Laterality: Left;  . A/V FISTULAGRAM Left 06/23/2019   Procedure: A/V FISTULAGRAM;  Surgeon: Serafina Mitchell, MD;  Location: [place] [facility];  Service: Cardiovascular;  Laterality: Left;  . A/V FISTULAGRAM Left 03/15/2020   Procedure: A/V FISTULAGRAM;  Surgeon: Serafina Mitchell, MD;  Location: [place] [facility];  Service: Cardiovascular;  Laterality: Left;  . BASCILIC VEIN TRANSPOSITION Left 06/05/2018   Procedure: FIRST STAGE BASILIC VEIN TRANSPOSITION LEFT ARM;  Surgeon: Serafina Mitchell, MD;  Location: [place];  Service: Vascular;  Laterality: Left;  .  BASCILIC VEIN TRANSPOSITION Left 07/25/2018   Procedure: LEFT ARM BASILIC VEIN TRANSPOSITION;  Surgeon: Serafina Mitchell, MD;  [facility];  Service: Vascular;  Laterality: Left;  . COLONOSCOPY    . PATCH ANGIOPLASTY Left 06/23/2019   Procedure: Patch Angioplasty of Left Basilic Vein Transpostion;  Surgeon: Angelia Mould, MD;  Location: [place];  Service: Vascular;  Laterality: Left;  . PERIPHERAL VASCULAR BALLOON ANGIOPLASTY  05/05/2019   Procedure: PERIPHERAL VASCULAR BALLOON ANGIOPLASTY;  Surgeon: Serafina Mitchell, MD;  Location: [place] [facility];  Service: Cardiovascular;;  Lt upper arm fistula  . PERIPHERAL VASCULAR BALLOON ANGIOPLASTY  03/15/2020   Procedure: PERIPHERAL VASCULAR BALLOON ANGIOPLASTY;  Surgeon: Serafina Mitchell, MD;  Location: [place] [facility];  Service: Cardiovascular;;  . REVISON OF ARTERIOVENOUS FISTULA Left 06/23/2019   Procedure: REVISION OF LEFT UPPER BASILIC VEIN TRANSPOSITION;  Surgeon: Angelia Mould, MD;  Location: [place];  Service: Vascular;  Laterality: Left;  . THROMBECTOMY W/ EMBOLECTOMY Left 07/25/2018   Procedure: Thrombectomy Arteriovenous Fistula;  Surgeon: Serafina Mitchell, MD;  Location: [facility] OR;  Service: Vascular;  Laterality: Left;  . THROMBECTOMY W/ EMBOLECTOMY Left 06/23/2019   Procedure: Thrombectomy of Left Basilic Vein Transposition;  Surgeon: Angelia Mould, MD;  Location: [place];  Service: Vascular;  Laterality: Left;  Marland Kitchen VASECTOMY      Social History   Socioeconomic History  .  Marital status: Legally Separated    Spouse name: Not on file  . Number of children: Not on file  . Years of education: Not on file  . Highest education level: Not on file  Occupational History  . Not on file  Tobacco Use  . Smoking status: Never Smoker  . Smokeless tobacco: Never Used  Vaping Use  . Vaping Use: Never used  Substance and Sexual Activity  . Alcohol use: No    Alcohol/week: 0.0 standard drinks  . Drug use: No  . Sexual  activity: Not on file  Other Topics Concern  . Not on file  Social History Narrative   Lives alone, divorced x's 3   Social Determinants of Health   Financial Resource Strain: Not on file  Food Insecurity: Not on file  Transportation Needs: Not on file  Physical Activity: Not on file  Stress: Not on file  Social Connections: Not on file  Intimate Partner Violence: Not on file    Family History  Problem Relation Age of Onset  . Hypertension Mother   . Hypertension Father   . COPD Father   . Coronary artery disease Father   . Coronary artery disease Brother 53       sudden death with MI    Current Outpatient Medications  Medication Sig Dispense Refill  . allopurinol (ZYLOPRIM) 100 MG tablet Take 100 mg by mouth daily.    . bisoprolol (ZEBETA) 10 MG tablet Take 10 mg by mouth 2 (two) times daily.     . colchicine-probenecid 0.5-500 MG tablet Take 0.5 tablets by mouth 2 (two) times daily as needed (gout flare).     . ezetimibe (ZETIA) 10 MG tablet Take 10 mg by mouth daily.    . hydrALAZINE (APRESOLINE) 50 MG tablet Take 50 mg by mouth 3 (three) times daily.    Marland Kitchen lidocaine-prilocaine (EMLA) cream Apply 1 application topically daily as needed (port access).     . rosuvastatin (CRESTOR) 5 MG tablet Take 5 mg by mouth daily.    . sucroferric oxyhydroxide (VELPHORO) 500 MG chewable tablet Chew 500 mg by mouth See admin instructions. Take 500 mg with each meal and snack     Current Facility-Administered Medications  Medication Dose Route Frequency Provider Last Rate Last Admin  . 0.9 %  sodium chloride infusion  250 mL Intravenous PRN Serafina Mitchell, MD        Allergies  Allergen Reactions  . Amlodipine Swelling  . Iodinated Diagnostic Agents   . Isosorbide     Severe headaches, extreme fatigue, lethargy  . Metolazone Other (See Comments)    Made kidney function worse      REVIEW OF SYSTEMS:   '[X]'$  denotes positive finding, '[ ]'$  denotes negative finding Cardiac   Comments:  Chest pain or chest pressure:    Shortness of breath upon exertion:    Short of breath when lying flat:    Irregular heart rhythm:        Vascular    Pain in calf, thigh, or hip brought on by ambulation:    Pain in feet at night that wakes you up from your sleep:     Blood clot in your veins:    Leg swelling:         Pulmonary    Oxygen at home:    Productive cough:     Wheezing:         Neurologic    Sudden weakness in arms or legs:  Sudden numbness in arms or legs:     Sudden onset of difficulty speaking or slurred speech:    Temporary loss of vision in one eye:     Problems with dizziness:         Gastrointestinal    Blood in stool:     Vomited blood:         Genitourinary    Burning when urinating:     Blood in urine:        Psychiatric    Major depression:         Hematologic    Bleeding problems:    Problems with blood clotting too easily:        Skin    Rashes or ulcers:        Constitutional    Fever or chills:      PHYSICAL EXAMINATION:  Vitals:   11/08/20 1134  BP: 134/86  Pulse: (!) 57  Resp: 20  Temp: 97.8 F (36.6 C)  TempSrc: Temporal  SpO2: 100%  Weight: 196 lb 11.2 oz (89.2 kg)  Height: '6\' 1"'$  (1.854 m)    General:  WDWN in NAD; vital signs documented above Gait: Not observed HENT: WNL, normocephalic Pulmonary: normal non-labored breathing Cardiac: regular HR Abdomen: soft, NT, no masses Skin: without rashes Extremities: Palpable thrill throughout left basilic vein fistula; 2 large areas of scabbing close together in the mid upper arm Musculoskeletal: no muscle wasting or atrophy  Neurologic: A&O X 3;  No focal weakness or paresthesias are detected Psychiatric:  The pt has Normal affect.   ASSESSMENT/PLAN:: 65 y.o. male here for evaluation of wounds overlying left arm fistula  -Patent brachiobasilic fistula with palpable thrill and no signs or symptoms of steal syndrome -There are 2 large dry eschar wounds  relatively close together in the mid upper arm of fistula at risk for spontaneous hemorrhage.  I offered the patient plication involving both areas with 1 incision and likely placement of tunneled dialysis catheter.  Patient was initially adamantly against not only having revision surgery of fistula but also against having placement of a tunneled dialysis catheter.  Dr. Carlis Abbott was asked to evaluate the patient today and also discussed the high risk for spontaneous bleeding from both areas.  Ultimately patient agreed to undergo revision surgery with plication.  His preference would be to have the revision surgery and allow the dialysis center to attempt cannulation of fistula.  If it is then determined a catheter is needed, he will then consent to come back for [facility] placement.  The above procedure will be scheduled on a Tuesday or Thursday at the patient's earliest convenience.  Dagoberto Ligas, PA-C [facility] [phone]  Clinic MD:   Dr. Carlis Abbott was involved in the evaluation and management plan of this patient

## 2020-11-18 ENCOUNTER — Encounter (HOSPITAL_COMMUNITY): Payer: Self-pay | Admitting: Vascular Surgery

## 2020-11-18 ENCOUNTER — Other Ambulatory Visit: Payer: Self-pay

## 2020-11-18 NOTE — Progress Notes (Signed)
Spoke with pt for pre-op call. Pt denies cardiac history or diabetes. Pt is a dialysis pt. Denies any recent chest pain or shortness of breath.   Pt's surgery is scheduled as ambulatory so no Covid test is required prior to surgery. Pt denies any Covid symptoms.

## 2020-11-22 ENCOUNTER — Ambulatory Visit (HOSPITAL_COMMUNITY): Payer: 59 | Admitting: Certified Registered Nurse Anesthetist

## 2020-11-22 ENCOUNTER — Ambulatory Visit (HOSPITAL_COMMUNITY): Payer: 59

## 2020-11-22 ENCOUNTER — Ambulatory Visit (HOSPITAL_COMMUNITY)
Admission: RE | Admit: 2020-11-22 | Discharge: 2020-11-22 | Disposition: A | Discharge status: Home / Self Care | Payer: 59 | Attending: Vascular Surgery | Admitting: Vascular Surgery

## 2020-11-22 ENCOUNTER — Encounter (HOSPITAL_COMMUNITY)
Admission: RE | Disposition: A | Discharge status: Home / Self Care | Payer: Self-pay | Source: Home / Self Care | Attending: Vascular Surgery

## 2020-11-22 ENCOUNTER — Encounter (HOSPITAL_COMMUNITY): Payer: Self-pay | Admitting: Vascular Surgery

## 2020-11-22 DIAGNOSIS — Z888 Allergy status to other drugs, medicaments and biological substances status: Secondary | ICD-10-CM | POA: Insufficient documentation

## 2020-11-22 DIAGNOSIS — Z79899 Other long term (current) drug therapy: Secondary | ICD-10-CM | POA: Insufficient documentation

## 2020-11-22 DIAGNOSIS — Z419 Encounter for procedure for purposes other than remedying health state, unspecified: Secondary | ICD-10-CM

## 2020-11-22 DIAGNOSIS — Z992 Dependence on renal dialysis: Secondary | ICD-10-CM | POA: Insufficient documentation

## 2020-11-22 DIAGNOSIS — T82898A Other specified complication of vascular prosthetic devices, implants and grafts, initial encounter: Secondary | ICD-10-CM | POA: Insufficient documentation

## 2020-11-22 DIAGNOSIS — N185 Chronic kidney disease, stage 5: Secondary | ICD-10-CM

## 2020-11-22 HISTORY — DX: Pneumonia, unspecified organism: J18.9

## 2020-11-22 HISTORY — PX: FISTULA SUPERFICIALIZATION: SHX6341

## 2020-11-22 LAB — POCT I-STAT, CHEM 8
BUN: 33 mg/dL — ABNORMAL HIGH (ref 8–23)
Calcium, Ion: 1.04 mmol/L — ABNORMAL LOW (ref 1.15–1.40)
Chloride: 100 mmol/L (ref 98–111)
Creatinine, Ser: 11.3 mg/dL — ABNORMAL HIGH (ref 0.61–1.24)
Glucose, Bld: 81 mg/dL (ref 70–99)
HCT: 34 % — ABNORMAL LOW (ref 39.0–52.0)
Hemoglobin: 11.6 g/dL — ABNORMAL LOW (ref 13.0–17.0)
Potassium: 4.6 mmol/L (ref 3.5–5.1)
Sodium: 136 mmol/L (ref 135–145)
TCO2: 28 mmol/L (ref 22–32)

## 2020-11-22 SURGERY — FISTULA SUPERFICIALIZATION
Anesthesia: General | Site: Arm Upper | Laterality: Left

## 2020-11-22 MED ORDER — SODIUM CHLORIDE 0.9 % IV SOLN
INTRAVENOUS | Status: DC | PRN
  Administered 2020-11-22: 500 mL

## 2020-11-22 MED ORDER — 0.9 % SODIUM CHLORIDE (POUR BTL) OPTIME
TOPICAL | Status: DC | PRN
  Administered 2020-11-22: 1000 mL

## 2020-11-22 MED ORDER — CHLORHEXIDINE GLUCONATE 4 % EX LIQD: 60.0000 mL | Freq: Once | CUTANEOUS | Status: DC

## 2020-11-22 MED ORDER — FENTANYL CITRATE (PF) 100 MCG/2ML IJ SOLN: 25.0000 ug | INTRAMUSCULAR | Status: DC | PRN

## 2020-11-22 MED ORDER — OXYCODONE HCL 5 MG PO TABS: 5.0000 mg | ORAL_TABLET | ORAL | 0 refills | Status: AC | PRN

## 2020-11-22 MED ORDER — FENTANYL CITRATE (PF) 250 MCG/5ML IJ SOLN
INTRAMUSCULAR | Status: DC | PRN
  Administered 2020-11-22: 50 ug via INTRAVENOUS

## 2020-11-22 MED ORDER — CHLORHEXIDINE GLUCONATE 0.12 % MT SOLN
15.0000 mL | Freq: Once | OROMUCOSAL | Status: AC
  Administered 2020-11-22: 15 mL via OROMUCOSAL
  Filled 2020-11-22: qty 15

## 2020-11-22 MED ORDER — LIDOCAINE 2% (20 MG/ML) 5 ML SYRINGE
INTRAMUSCULAR | Status: AC
  Filled 2020-11-22: qty 5

## 2020-11-22 MED ORDER — PAPAVERINE HCL 30 MG/ML IJ SOLN
INTRAMUSCULAR | Status: AC
  Filled 2020-11-22: qty 2

## 2020-11-22 MED ORDER — HEPARIN SODIUM (PORCINE) 1000 UNIT/ML IJ SOLN
INTRAMUSCULAR | Status: AC
  Filled 2020-11-22: qty 1

## 2020-11-22 MED ORDER — SODIUM CHLORIDE 0.9 % IV SOLN
INTRAVENOUS | Status: AC
  Filled 2020-11-22: qty 1.2

## 2020-11-22 MED ORDER — PROPOFOL 10 MG/ML IV BOLUS
INTRAVENOUS | Status: AC
  Filled 2020-11-22: qty 20

## 2020-11-22 MED ORDER — PHENYLEPHRINE 40 MCG/ML (10ML) SYRINGE FOR IV PUSH (FOR BLOOD PRESSURE SUPPORT)
PREFILLED_SYRINGE | INTRAVENOUS | Status: DC | PRN
  Administered 2020-11-22 (×2): 80 ug via INTRAVENOUS

## 2020-11-22 MED ORDER — LIDOCAINE 2% (20 MG/ML) 5 ML SYRINGE
INTRAMUSCULAR | Status: DC | PRN
  Administered 2020-11-22: 60 mg via INTRAVENOUS

## 2020-11-22 MED ORDER — PROTAMINE SULFATE 10 MG/ML IV SOLN
INTRAVENOUS | Status: DC | PRN
  Administered 2020-11-22: 40 mg via INTRAVENOUS

## 2020-11-22 MED ORDER — CEFAZOLIN SODIUM-DEXTROSE 2-4 GM/100ML-% IV SOLN
2.0000 g | INTRAVENOUS | Status: AC
  Administered 2020-11-22: 2 g via INTRAVENOUS
  Filled 2020-11-22: qty 100

## 2020-11-22 MED ORDER — MIDAZOLAM HCL 2 MG/2ML IJ SOLN
INTRAMUSCULAR | Status: AC
  Filled 2020-11-22: qty 2

## 2020-11-22 MED ORDER — FENTANYL CITRATE (PF) 250 MCG/5ML IJ SOLN
INTRAMUSCULAR | Status: AC
  Filled 2020-11-22: qty 5

## 2020-11-22 MED ORDER — MIDAZOLAM HCL 5 MG/5ML IJ SOLN
INTRAMUSCULAR | Status: DC | PRN
  Administered 2020-11-22 (×2): 1 mg via INTRAVENOUS

## 2020-11-22 MED ORDER — LIDOCAINE-EPINEPHRINE (PF) 1 %-1:200000 IJ SOLN
INTRAMUSCULAR | Status: DC | PRN
  Administered 2020-11-22: 11 mL via INTRADERMAL

## 2020-11-22 MED ORDER — ORAL CARE MOUTH RINSE: 15.0000 mL | Freq: Once | OROMUCOSAL | Status: AC

## 2020-11-22 MED ORDER — SODIUM CHLORIDE 0.9 % IV SOLN: INTRAVENOUS | Status: DC

## 2020-11-22 MED ORDER — EPHEDRINE 5 MG/ML INJ
INTRAVENOUS | Status: AC
  Filled 2020-11-22: qty 10

## 2020-11-22 MED ORDER — HEPARIN SODIUM (PORCINE) 1000 UNIT/ML IJ SOLN
INTRAMUSCULAR | Status: DC | PRN
  Administered 2020-11-22: 7000 [IU] via INTRAVENOUS

## 2020-11-22 MED ORDER — PHENYLEPHRINE 40 MCG/ML (10ML) SYRINGE FOR IV PUSH (FOR BLOOD PRESSURE SUPPORT)
PREFILLED_SYRINGE | INTRAVENOUS | Status: AC
  Filled 2020-11-22: qty 10

## 2020-11-22 MED ORDER — PROPOFOL 500 MG/50ML IV EMUL
INTRAVENOUS | Status: DC | PRN
  Administered 2020-11-22: 75 ug/kg/min via INTRAVENOUS

## 2020-11-22 MED ORDER — LIDOCAINE-EPINEPHRINE (PF) 1 %-1:200000 IJ SOLN
INTRAMUSCULAR | Status: AC
  Filled 2020-11-22: qty 30

## 2020-11-22 MED ORDER — EPHEDRINE SULFATE-NACL 50-0.9 MG/10ML-% IV SOSY
PREFILLED_SYRINGE | INTRAVENOUS | Status: DC | PRN
  Administered 2020-11-22 (×2): 10 mg via INTRAVENOUS

## 2020-11-22 MED ORDER — ONDANSETRON HCL 4 MG/2ML IJ SOLN: 4.0000 mg | Freq: Once | INTRAMUSCULAR | Status: DC | PRN

## 2020-11-22 SURGICAL SUPPLY — 59 items
ADH SKN CLS APL DERMABOND .7 (GAUZE/BANDAGES/DRESSINGS) ×2
ADH SKN CLS LQ APL DERMABOND (GAUZE/BANDAGES/DRESSINGS) ×2
APL PRP STRL LF DISP 70% ISPRP (MISCELLANEOUS)
ARMBAND PINK RESTRICT EXTREMIT (MISCELLANEOUS) ×3 IMPLANT
BAG DECANTER FOR FLEXI CONT (MISCELLANEOUS) ×3 IMPLANT
BIOPATCH RED 1 DISK 7.0 (GAUZE/BANDAGES/DRESSINGS) ×1 IMPLANT
CANISTER SUCT 3000ML PPV (MISCELLANEOUS) ×3 IMPLANT
CANNULA VESSEL 3MM 2 BLNT TIP (CANNULA) ×3 IMPLANT
CATH PALINDROME-P 19CM W/VT (CATHETERS) IMPLANT
CATH PALINDROME-P 23CM W/VT (CATHETERS) IMPLANT
CATH PALINDROME-P 28CM W/VT (CATHETERS) IMPLANT
CHLORAPREP W/TINT 26 (MISCELLANEOUS) ×1 IMPLANT
CLIP VESOCCLUDE MED 6/CT (CLIP) ×3 IMPLANT
CLIP VESOCCLUDE SM WIDE 6/CT (CLIP) ×3 IMPLANT
COVER PROBE W GEL 5X96 (DRAPES) ×1 IMPLANT
COVER SURGICAL LIGHT HANDLE (MISCELLANEOUS) ×3 IMPLANT
COVER WAND RF STERILE (DRAPES) ×1 IMPLANT
DECANTER SPIKE VIAL GLASS SM (MISCELLANEOUS) ×3 IMPLANT
DERMABOND ADHESIVE PROPEN (GAUZE/BANDAGES/DRESSINGS) ×1
DERMABOND ADVANCED (GAUZE/BANDAGES/DRESSINGS) ×1
DERMABOND ADVANCED .7 DNX12 (GAUZE/BANDAGES/DRESSINGS) ×2 IMPLANT
DERMABOND ADVANCED .7 DNX6 (GAUZE/BANDAGES/DRESSINGS) ×1 IMPLANT
DRAPE C-ARM 42X72 X-RAY (DRAPES) ×1 IMPLANT
DRAPE CHEST BREAST 15X10 FENES (DRAPES) ×1 IMPLANT
ELECT REM PT RETURN 9FT ADLT (ELECTROSURGICAL) ×3
ELECTRODE REM PT RTRN 9FT ADLT (ELECTROSURGICAL) ×2 IMPLANT
GAUZE 4X4 16PLY RFD (DISPOSABLE) ×3 IMPLANT
GLOVE BIO SURGEON STRL SZ7.5 (GLOVE) ×3 IMPLANT
GLOVE SRG 8 PF TXTR STRL LF DI (GLOVE) ×2 IMPLANT
GLOVE SURG UNDER POLY LF SZ8 (GLOVE) ×3
GOWN STRL REUS W/ TWL LRG LVL3 (GOWN DISPOSABLE) ×6 IMPLANT
GOWN STRL REUS W/TWL LRG LVL3 (GOWN DISPOSABLE) ×9
KIT BASIN OR (CUSTOM PROCEDURE TRAY) ×3 IMPLANT
KIT PALINDROME-P 55CM (CATHETERS) IMPLANT
KIT TURNOVER KIT B (KITS) ×3 IMPLANT
NDL 18GX1X1/2 (RX/OR ONLY) (NEEDLE) ×1 IMPLANT
NDL HYPO 25GX1X1/2 BEV (NEEDLE) ×1 IMPLANT
NEEDLE 18GX1X1/2 (RX/OR ONLY) (NEEDLE) ×3 IMPLANT
NEEDLE HYPO 25GX1X1/2 BEV (NEEDLE) IMPLANT
NS IRRIG 1000ML POUR BTL (IV SOLUTION) ×3 IMPLANT
PACK CV ACCESS (CUSTOM PROCEDURE TRAY) ×3 IMPLANT
PACK SURGICAL SETUP 50X90 (CUSTOM PROCEDURE TRAY) ×1 IMPLANT
PAD ARMBOARD 7.5X6 YLW CONV (MISCELLANEOUS) ×6 IMPLANT
SPONGE SURGIFOAM ABS GEL 100 (HEMOSTASIS) IMPLANT
STOCKINETTE 3IN STRL (GAUZE/BANDAGES/DRESSINGS) ×2 IMPLANT
SUT ETHILON 3 0 PS 1 (SUTURE) ×1 IMPLANT
SUT MNCRL AB 4-0 PS2 18 (SUTURE) ×3 IMPLANT
SUT PROLENE 5 0 C 1 24 (SUTURE) ×4 IMPLANT
SUT PROLENE 6 0 BV (SUTURE) ×3 IMPLANT
SUT VIC AB 3-0 SH 27 (SUTURE) ×6
SUT VIC AB 3-0 SH 27X BRD (SUTURE) ×3 IMPLANT
SYR 10ML LL (SYRINGE) ×3 IMPLANT
SYR 20ML LL LF (SYRINGE) ×2 IMPLANT
SYR 5ML LL (SYRINGE) ×4 IMPLANT
SYR CONTROL 10ML LL (SYRINGE) ×3 IMPLANT
TOWEL GREEN STERILE (TOWEL DISPOSABLE) ×6 IMPLANT
TOWEL GREEN STERILE FF (TOWEL DISPOSABLE) ×3 IMPLANT
UNDERPAD 30X36 HEAVY ABSORB (UNDERPADS AND DIAPERS) ×3 IMPLANT
WATER STERILE IRR 1000ML POUR (IV SOLUTION) ×3 IMPLANT

## 2020-11-22 NOTE — Anesthesia Procedure Notes (Signed)
Procedure Name: MAC Date/Time: 11/22/2020 7:35 AM Performed by: Colin Benton, CRNA Pre-anesthesia Checklist: Patient identified, Emergency Drugs available, Suction available and Patient being monitored Patient Re-evaluated:Patient Re-evaluated prior to induction Oxygen Delivery Method: Nasal cannula Induction Type: IV induction Placement Confirmation: positive ETCO2 Dental Injury: Teeth and Oropharynx as per pre-operative assessment

## 2020-11-22 NOTE — Interval H&P Note (Signed)
History and Physical Interval Note:  11/22/2020 7:08 AM  [name]  has presented today for surgery, with the diagnosis of ESRD.  The various methods of treatment have been discussed with the patient and family. After consideration of risks, benefits and other options for treatment, the patient has consented to  Procedure(s): LEFT ARM FISTULA PLICATION (Left) INSERTION OF TUNNELED DIALYSIS CATHETER (N/A) as a surgical intervention.  The patient's history has been reviewed, patient examined, no change in status, stable for surgery.  I have reviewed the patient's chart and labs.  Questions were answered to the patient's satisfaction.     Deitra Mayo

## 2020-11-22 NOTE — Transfer of Care (Signed)
Immediate Anesthesia Transfer of Care Note  Patient: Vickey Sages  Procedure(s) Performed: LEFT ARM FISTULA PLICATION (Left Arm Upper)  Patient Location: PACU  Anesthesia Type:MAC  Level of Consciousness: drowsy and patient cooperative  Airway & Oxygen Therapy: Patient Spontanous Breathing and Patient connected to nasal cannula oxygen  Post-op Assessment: Report given to RN and Post -op Vital signs reviewed and stable  Post vital signs: Reviewed and stable  Last Vitals:  Vitals Value Taken Time  BP 153/82 11/22/20 0913  Temp    Pulse 61 11/22/20 0917  Resp 19 11/22/20 0917  SpO2 98 % 11/22/20 0917  Vitals shown include unvalidated device data.  Last Pain:  Vitals:   11/22/20 0624  TempSrc: Oral  PainSc: 0-No pain         Complications: No complications documented.

## 2020-11-22 NOTE — Anesthesia Postprocedure Evaluation (Signed)
Anesthesia Post Note  Patient: [name]  Procedure(s) Performed: LEFT ARM FISTULA PLICATION (Left Arm Upper)     Patient location during evaluation: PACU Anesthesia Type: General Level of consciousness: awake and alert Pain management: pain level controlled Vital Signs Assessment: post-procedure vital signs reviewed and stable Respiratory status: spontaneous breathing, nonlabored ventilation, respiratory function stable and patient connected to nasal cannula oxygen Cardiovascular status: stable and blood pressure returned to baseline Postop Assessment: no apparent nausea or vomiting Anesthetic complications: no   No complications documented.  Last Vitals:  Vitals:   11/22/20 0930 11/22/20 0945  BP: (!) 159/87 (!) 163/86  Pulse: 63 (!) 58  Resp: 17 18  Temp:  (!) 36.4 C  SpO2: 100% 100%    Last Pain:  Vitals:   11/22/20 0945  TempSrc:   PainSc: 0-No pain                 [name]

## 2020-11-22 NOTE — Anesthesia Preprocedure Evaluation (Signed)
Anesthesia Evaluation  Patient identified by MRN, date of birth, ID band Patient awake    Reviewed: Allergy & Precautions, NPO status , Patient's Chart, lab work & pertinent test results  Airway Mallampati: II  TM Distance: >3 FB Neck ROM: Full    Dental  (+) Edentulous Upper   Pulmonary    breath sounds clear to auscultation       Cardiovascular hypertension,  Rhythm:Regular Rate:Normal     Neuro/Psych    GI/Hepatic   Endo/Other    Renal/GU      Musculoskeletal   Abdominal   Peds  Hematology   Anesthesia Other Findings   Reproductive/Obstetrics                             Anesthesia Physical Anesthesia Plan  ASA: III  Anesthesia Plan: General   Post-op Pain Management:    Induction: Intravenous  PONV Risk Score and Plan: Ondansetron and Dexamethasone  Airway Management Planned: LMA  Additional Equipment:   Intra-op Plan:   Post-operative Plan:   Informed Consent: I have reviewed the patients History and Physical, chart, labs and discussed the procedure including the risks, benefits and alternatives for the proposed anesthesia with the patient or authorized representative who has indicated his/her understanding and acceptance.       Plan Discussed with: CRNA and Anesthesiologist  Anesthesia Plan Comments:         Anesthesia Quick Evaluation

## 2020-11-22 NOTE — Op Note (Signed)
    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 Scot Dock, MD  ASSIST: Laurence Slate, PA  ANESTHESIA: Local with sedation  EBL: Minimal  INDICATIONS:    [name] is a 65 y.o. male who has a left brachiocephalic fistula.  He has 2 aneurysmal areas overlying the fistula 1 of which has thinning of the skin.  The more proximal aneurysm has a large Esco at significant risk of bleeding.  He was set up for plication of the fistula.  He adamantly did not want to have a catheter placed.  FINDINGS:   Good thrill at the completion of the procedure.  TECHNIQUE:   The patient was taken to the operating room and sedated by anesthesia.  The left upper extremity was prepped and draped usual sterile fashion.  I anesthetized the skin after I marked to [place] incisions to encompass these aneurysms.  An elliptical incision was made overlying the distal aneurysm here there was no eschar overlying the aneurysm just thinning of the skin.  I excised the area with this thin skin and the fistula was intact.  A separate incision was made over the more proximal aneurysm which was larger and had a large eschar overlying this.  This was dissected free almost circumferentially.  The patient was heparinized.  The fistula was then controlled proximally and distally and I excised this eschar and an ellipse of the fistula wall anteriorly.  This was then plicated by closing this with a running 5-0 Prolene suture.  The really was not enough mobility to roll the suture line posteriorly or laterally and therefore the suture line is anterior.  The heparin was partially reversed with protamine.  Each of these wounds was closed with a deep layer of 3-0 Vicryl and the skin closed with 4-0 Vicryl.   Dermabond was applied.  The patient tolerated the procedure well and was transferred to the recovery room in stable condition.  All needle and sponge counts were correct.  Given the complexity of the case a first assistant was necessary in order to expedient the procedure and safely perform the technical aspects of the operation.  Deitra Mayo, MD, FACS [facility]  DATE OF DICTATION:   11/22/2020

## 2020-11-23 ENCOUNTER — Encounter (HOSPITAL_COMMUNITY): Payer: Self-pay | Admitting: Vascular Surgery
